# Patient Record
Sex: Female | Born: 1970 | Hispanic: No | Marital: Married | Smoking: Former smoker
Health system: Southern US, Community
[De-identification: ages and names within clinical notes are randomized; demographics above are authoritative.]

## PROBLEM LIST (undated history)

## (undated) DIAGNOSIS — K224 Dyskinesia of esophagus: Secondary | ICD-10-CM

## (undated) DIAGNOSIS — J449 Chronic obstructive pulmonary disease, unspecified: Secondary | ICD-10-CM

## (undated) DIAGNOSIS — J45909 Unspecified asthma, uncomplicated: Secondary | ICD-10-CM

## (undated) DIAGNOSIS — K219 Gastro-esophageal reflux disease without esophagitis: Secondary | ICD-10-CM

## (undated) DIAGNOSIS — E785 Hyperlipidemia, unspecified: Secondary | ICD-10-CM

## (undated) DIAGNOSIS — G43909 Migraine, unspecified, not intractable, without status migrainosus: Secondary | ICD-10-CM

## (undated) DIAGNOSIS — S43006A Unspecified dislocation of unspecified shoulder joint, initial encounter: Secondary | ICD-10-CM

## (undated) DIAGNOSIS — E7800 Pure hypercholesterolemia, unspecified: Secondary | ICD-10-CM

## (undated) DIAGNOSIS — G510 Bell's palsy: Secondary | ICD-10-CM

## (undated) DIAGNOSIS — F429 Obsessive-compulsive disorder, unspecified: Secondary | ICD-10-CM

## (undated) DIAGNOSIS — C801 Malignant (primary) neoplasm, unspecified: Secondary | ICD-10-CM

## (undated) DIAGNOSIS — G4730 Sleep apnea, unspecified: Secondary | ICD-10-CM

## (undated) DIAGNOSIS — R51 Headache: Secondary | ICD-10-CM

## (undated) DIAGNOSIS — M797 Fibromyalgia: Secondary | ICD-10-CM

## (undated) DIAGNOSIS — F32A Depression, unspecified: Secondary | ICD-10-CM

## (undated) DIAGNOSIS — F419 Anxiety disorder, unspecified: Secondary | ICD-10-CM

## (undated) DIAGNOSIS — F319 Bipolar disorder, unspecified: Secondary | ICD-10-CM

## (undated) DIAGNOSIS — R5383 Other fatigue: Secondary | ICD-10-CM

## (undated) DIAGNOSIS — E119 Type 2 diabetes mellitus without complications: Secondary | ICD-10-CM

## (undated) DIAGNOSIS — E079 Disorder of thyroid, unspecified: Secondary | ICD-10-CM

## (undated) DIAGNOSIS — G4733 Obstructive sleep apnea (adult) (pediatric): Secondary | ICD-10-CM

## (undated) DIAGNOSIS — J9819 Other pulmonary collapse: Secondary | ICD-10-CM

## (undated) DIAGNOSIS — F329 Major depressive disorder, single episode, unspecified: Secondary | ICD-10-CM

## (undated) HISTORY — DX: Bell's palsy: G51.0

## (undated) HISTORY — DX: Pure hypercholesterolemia, unspecified: E78.00

## (undated) HISTORY — DX: Obsessive-compulsive disorder, unspecified: F42.9

## (undated) HISTORY — DX: Obstructive sleep apnea (adult) (pediatric): G47.33

## (undated) HISTORY — DX: Disorder of thyroid, unspecified: E07.9

## (undated) HISTORY — DX: Gastro-esophageal reflux disease without esophagitis: K21.9

## (undated) HISTORY — PX: DILATION AND CURETTAGE OF UTERUS: SHX78

## (undated) HISTORY — DX: Hyperlipidemia, unspecified: E78.5

## (undated) HISTORY — DX: Major depressive disorder, single episode, unspecified: F32.9

## (undated) HISTORY — DX: Type 2 diabetes mellitus without complications: E11.9

## (undated) HISTORY — DX: Malignant (primary) neoplasm, unspecified: C80.1

## (undated) HISTORY — DX: Other pulmonary collapse: J98.19

## (undated) HISTORY — DX: Migraine, unspecified, not intractable, without status migrainosus: G43.909

## (undated) HISTORY — PX: GALLBLADDER SURGERY: SHX652

## (undated) HISTORY — DX: Fibromyalgia: M79.7

## (undated) HISTORY — DX: Anxiety disorder, unspecified: F41.9

## (undated) HISTORY — PX: ABDOMINAL HYSTERECTOMY: SHX81

## (undated) HISTORY — DX: Dyskinesia of esophagus: K22.4

## (undated) HISTORY — DX: Sleep apnea, unspecified: G47.30

## (undated) HISTORY — DX: Headache: R51

## (undated) HISTORY — DX: Depression, unspecified: F32.A

## (undated) HISTORY — DX: Chronic obstructive pulmonary disease, unspecified: J44.9

## (undated) HISTORY — PX: OTHER SURGICAL HISTORY: SHX169

## (undated) HISTORY — DX: Unspecified dislocation of unspecified shoulder joint, initial encounter: S43.006A

## (undated) HISTORY — DX: Unspecified asthma, uncomplicated: J45.909

## (undated) HISTORY — DX: Bipolar disorder, unspecified: F31.9

## (undated) HISTORY — DX: Other fatigue: R53.83

---

## 1996-09-17 HISTORY — PX: SHOULDER SURGERY: SHX246

## 1997-03-07 DIAGNOSIS — G9332 Myalgic encephalomyelitis/chronic fatigue syndrome: Secondary | ICD-10-CM | POA: Insufficient documentation

## 1997-10-18 DIAGNOSIS — F4010 Social phobia, unspecified: Secondary | ICD-10-CM | POA: Insufficient documentation

## 1998-07-09 ENCOUNTER — Emergency Department (HOSPITAL_COMMUNITY): Admission: EM | Admit: 1998-07-09 | Discharge: 1998-07-09 | Payer: Self-pay | Admitting: Emergency Medicine

## 1999-11-02 ENCOUNTER — Emergency Department (HOSPITAL_COMMUNITY): Admission: EM | Admit: 1999-11-02 | Discharge: 1999-11-02 | Payer: Self-pay | Admitting: *Deleted

## 1999-12-22 ENCOUNTER — Encounter: Payer: Self-pay | Admitting: Emergency Medicine

## 1999-12-22 ENCOUNTER — Emergency Department (HOSPITAL_COMMUNITY): Admission: EM | Admit: 1999-12-22 | Discharge: 1999-12-22 | Payer: Self-pay | Admitting: Emergency Medicine

## 2000-01-25 ENCOUNTER — Emergency Department (HOSPITAL_COMMUNITY): Admission: EM | Admit: 2000-01-25 | Discharge: 2000-01-25 | Payer: Self-pay | Admitting: Emergency Medicine

## 2000-02-20 ENCOUNTER — Emergency Department (HOSPITAL_COMMUNITY): Admission: EM | Admit: 2000-02-20 | Discharge: 2000-02-20 | Payer: Self-pay | Admitting: Emergency Medicine

## 2000-04-11 ENCOUNTER — Emergency Department (HOSPITAL_COMMUNITY): Admission: EM | Admit: 2000-04-11 | Discharge: 2000-04-11 | Payer: Self-pay | Admitting: Emergency Medicine

## 2000-05-25 ENCOUNTER — Emergency Department (HOSPITAL_COMMUNITY): Admission: EM | Admit: 2000-05-25 | Discharge: 2000-05-25 | Payer: Self-pay | Admitting: Emergency Medicine

## 2000-08-15 ENCOUNTER — Inpatient Hospital Stay (HOSPITAL_COMMUNITY): Admission: EM | Admit: 2000-08-15 | Discharge: 2000-08-15 | Payer: Self-pay | Admitting: Emergency Medicine

## 2000-08-15 ENCOUNTER — Encounter: Payer: Self-pay | Admitting: Emergency Medicine

## 2000-08-21 ENCOUNTER — Emergency Department (HOSPITAL_COMMUNITY): Admission: EM | Admit: 2000-08-21 | Discharge: 2000-08-21 | Payer: Self-pay | Admitting: Emergency Medicine

## 2000-10-24 ENCOUNTER — Emergency Department (HOSPITAL_COMMUNITY): Admission: EM | Admit: 2000-10-24 | Discharge: 2000-10-24 | Payer: Self-pay | Admitting: Emergency Medicine

## 2000-10-31 ENCOUNTER — Ambulatory Visit (HOSPITAL_COMMUNITY): Admission: RE | Admit: 2000-10-31 | Discharge: 2000-10-31 | Payer: Self-pay | Admitting: Internal Medicine

## 2000-10-31 ENCOUNTER — Encounter: Payer: Self-pay | Admitting: Internal Medicine

## 2000-11-01 ENCOUNTER — Emergency Department (HOSPITAL_COMMUNITY): Admission: EM | Admit: 2000-11-01 | Discharge: 2000-11-01 | Payer: Self-pay | Admitting: Emergency Medicine

## 2000-12-23 ENCOUNTER — Emergency Department (HOSPITAL_COMMUNITY): Admission: EM | Admit: 2000-12-23 | Discharge: 2000-12-23 | Payer: Self-pay | Admitting: Emergency Medicine

## 2001-01-12 ENCOUNTER — Emergency Department (HOSPITAL_COMMUNITY): Admission: EM | Admit: 2001-01-12 | Discharge: 2001-01-12 | Payer: Self-pay | Admitting: Emergency Medicine

## 2001-01-16 ENCOUNTER — Encounter (HOSPITAL_COMMUNITY): Admission: RE | Admit: 2001-01-16 | Discharge: 2001-04-16 | Payer: Self-pay | Admitting: *Deleted

## 2001-01-23 ENCOUNTER — Emergency Department (HOSPITAL_COMMUNITY): Admission: EM | Admit: 2001-01-23 | Discharge: 2001-01-24 | Payer: Self-pay | Admitting: Emergency Medicine

## 2001-02-02 ENCOUNTER — Emergency Department (HOSPITAL_COMMUNITY): Admission: EM | Admit: 2001-02-02 | Discharge: 2001-02-02 | Payer: Self-pay | Admitting: Emergency Medicine

## 2001-02-26 ENCOUNTER — Emergency Department (HOSPITAL_COMMUNITY): Admission: EM | Admit: 2001-02-26 | Discharge: 2001-02-26 | Payer: Self-pay | Admitting: Emergency Medicine

## 2001-05-02 ENCOUNTER — Emergency Department (HOSPITAL_COMMUNITY): Admission: EM | Admit: 2001-05-02 | Discharge: 2001-05-02 | Payer: Self-pay | Admitting: *Deleted

## 2001-05-16 ENCOUNTER — Emergency Department (HOSPITAL_COMMUNITY): Admission: EM | Admit: 2001-05-16 | Discharge: 2001-05-16 | Payer: Self-pay | Admitting: Emergency Medicine

## 2001-06-04 ENCOUNTER — Emergency Department (HOSPITAL_COMMUNITY): Admission: EM | Admit: 2001-06-04 | Discharge: 2001-06-04 | Payer: Self-pay | Admitting: Emergency Medicine

## 2001-07-29 ENCOUNTER — Emergency Department (HOSPITAL_COMMUNITY): Admission: EM | Admit: 2001-07-29 | Discharge: 2001-07-29 | Payer: Self-pay | Admitting: Emergency Medicine

## 2001-09-03 ENCOUNTER — Emergency Department (HOSPITAL_COMMUNITY): Admission: EM | Admit: 2001-09-03 | Discharge: 2001-09-03 | Payer: Self-pay | Admitting: Emergency Medicine

## 2001-09-03 ENCOUNTER — Emergency Department (HOSPITAL_COMMUNITY): Admission: EM | Admit: 2001-09-03 | Discharge: 2001-09-03 | Payer: Self-pay

## 2001-09-14 ENCOUNTER — Emergency Department (HOSPITAL_COMMUNITY): Admission: EM | Admit: 2001-09-14 | Discharge: 2001-09-14 | Payer: Self-pay | Admitting: Emergency Medicine

## 2001-09-14 ENCOUNTER — Encounter: Payer: Self-pay | Admitting: *Deleted

## 2001-10-24 ENCOUNTER — Emergency Department (HOSPITAL_COMMUNITY): Admission: EM | Admit: 2001-10-24 | Discharge: 2001-10-24 | Payer: Self-pay | Admitting: Emergency Medicine

## 2001-12-22 ENCOUNTER — Emergency Department (HOSPITAL_COMMUNITY): Admission: EM | Admit: 2001-12-22 | Discharge: 2001-12-22 | Payer: Self-pay | Admitting: *Deleted

## 2002-01-01 ENCOUNTER — Inpatient Hospital Stay (HOSPITAL_COMMUNITY): Admission: EM | Admit: 2002-01-01 | Discharge: 2002-01-14 | Payer: Self-pay | Admitting: Psychiatry

## 2002-05-03 ENCOUNTER — Emergency Department (HOSPITAL_COMMUNITY): Admission: EM | Admit: 2002-05-03 | Discharge: 2002-05-03 | Payer: Self-pay | Admitting: Emergency Medicine

## 2006-06-22 ENCOUNTER — Inpatient Hospital Stay (HOSPITAL_COMMUNITY): Admission: EM | Admit: 2006-06-22 | Discharge: 2006-07-01 | Payer: Self-pay | Admitting: Psychiatry

## 2006-06-22 ENCOUNTER — Ambulatory Visit: Payer: Self-pay | Admitting: Psychiatry

## 2006-07-02 ENCOUNTER — Other Ambulatory Visit (HOSPITAL_COMMUNITY): Admission: RE | Admit: 2006-07-02 | Discharge: 2006-09-30 | Payer: Self-pay | Admitting: Psychiatry

## 2007-11-04 ENCOUNTER — Ambulatory Visit: Payer: Self-pay | Admitting: Psychiatry

## 2007-11-04 ENCOUNTER — Inpatient Hospital Stay (HOSPITAL_COMMUNITY): Admission: AD | Admit: 2007-11-04 | Discharge: 2007-11-10 | Payer: Self-pay | Admitting: Psychiatry

## 2007-12-19 ENCOUNTER — Inpatient Hospital Stay (HOSPITAL_COMMUNITY): Admission: AD | Admit: 2007-12-19 | Discharge: 2007-12-20 | Payer: Self-pay | Admitting: Obstetrics and Gynecology

## 2008-05-09 ENCOUNTER — Emergency Department (HOSPITAL_COMMUNITY): Admission: EM | Admit: 2008-05-09 | Discharge: 2008-05-09 | Payer: Self-pay | Admitting: Emergency Medicine

## 2008-05-25 ENCOUNTER — Ambulatory Visit (HOSPITAL_COMMUNITY): Admission: RE | Admit: 2008-05-25 | Discharge: 2008-05-26 | Payer: Self-pay | Admitting: Obstetrics & Gynecology

## 2008-05-25 ENCOUNTER — Encounter (INDEPENDENT_AMBULATORY_CARE_PROVIDER_SITE_OTHER): Payer: Self-pay | Admitting: Obstetrics & Gynecology

## 2008-12-05 ENCOUNTER — Emergency Department (HOSPITAL_COMMUNITY): Admission: EM | Admit: 2008-12-05 | Discharge: 2008-12-06 | Payer: Self-pay | Admitting: Emergency Medicine

## 2008-12-13 ENCOUNTER — Encounter: Admission: RE | Admit: 2008-12-13 | Discharge: 2009-03-13 | Payer: Self-pay | Admitting: Psychiatry

## 2009-03-30 ENCOUNTER — Encounter: Admission: RE | Admit: 2009-03-30 | Discharge: 2009-06-28 | Payer: Self-pay | Admitting: Psychiatry

## 2009-07-25 ENCOUNTER — Encounter: Admission: RE | Admit: 2009-07-25 | Discharge: 2009-07-25 | Payer: Self-pay | Admitting: Obstetrics & Gynecology

## 2009-11-12 ENCOUNTER — Emergency Department (HOSPITAL_COMMUNITY): Admission: EM | Admit: 2009-11-12 | Discharge: 2009-11-12 | Payer: Self-pay | Admitting: Emergency Medicine

## 2009-11-23 ENCOUNTER — Encounter: Admission: RE | Admit: 2009-11-23 | Discharge: 2010-01-17 | Payer: Self-pay | Admitting: Family Medicine

## 2010-02-04 ENCOUNTER — Emergency Department (HOSPITAL_COMMUNITY): Admission: EM | Admit: 2010-02-04 | Discharge: 2010-02-04 | Payer: Self-pay | Admitting: Emergency Medicine

## 2010-10-08 ENCOUNTER — Encounter: Payer: Self-pay | Admitting: Obstetrics & Gynecology

## 2010-10-10 ENCOUNTER — Ambulatory Visit (HOSPITAL_COMMUNITY)
Admission: RE | Admit: 2010-10-10 | Discharge: 2010-10-10 | Payer: Self-pay | Source: Home / Self Care | Attending: Psychiatry | Admitting: Psychiatry

## 2010-10-11 ENCOUNTER — Other Ambulatory Visit (HOSPITAL_COMMUNITY)
Admission: RE | Admit: 2010-10-11 | Discharge: 2010-10-17 | Payer: Self-pay | Source: Home / Self Care | Attending: Psychiatry | Admitting: Psychiatry

## 2010-10-19 ENCOUNTER — Inpatient Hospital Stay (HOSPITAL_COMMUNITY)
Admission: AD | Admit: 2010-10-19 | Discharge: 2010-10-23 | DRG: 881 | Disposition: A | Discharge status: Home / Self Care | Payer: Medicare Other | Source: Ambulatory Visit | Attending: Psychiatry | Admitting: Psychiatry

## 2010-10-19 ENCOUNTER — Inpatient Hospital Stay (HOSPITAL_COMMUNITY): Admission: AD | Admit: 2010-10-19 | Payer: Self-pay | Source: Ambulatory Visit

## 2010-10-19 DIAGNOSIS — R45851 Suicidal ideations: Secondary | ICD-10-CM

## 2010-10-19 DIAGNOSIS — E039 Hypothyroidism, unspecified: Secondary | ICD-10-CM

## 2010-10-19 DIAGNOSIS — F329 Major depressive disorder, single episode, unspecified: Principal | ICD-10-CM

## 2010-10-19 DIAGNOSIS — L408 Other psoriasis: Secondary | ICD-10-CM

## 2010-10-19 DIAGNOSIS — G43909 Migraine, unspecified, not intractable, without status migrainosus: Secondary | ICD-10-CM

## 2010-10-19 DIAGNOSIS — Z6379 Other stressful life events affecting family and household: Secondary | ICD-10-CM

## 2010-10-19 DIAGNOSIS — F3289 Other specified depressive episodes: Principal | ICD-10-CM

## 2010-10-19 DIAGNOSIS — IMO0002 Reserved for concepts with insufficient information to code with codable children: Secondary | ICD-10-CM

## 2010-10-19 LAB — CBC
HCT: 37.7 % (ref 36.0–46.0)
Hemoglobin: 13.2 g/dL (ref 12.0–15.0)
MCH: 29.8 pg (ref 26.0–34.0)
MCHC: 35 g/dL (ref 30.0–36.0)
MCV: 85.1 fL (ref 78.0–100.0)
Platelets: 219 K/uL (ref 150–400)
RBC: 4.43 MIL/uL (ref 3.87–5.11)
RDW: 12.4 % (ref 11.5–15.5)
WBC: 6.4 K/uL (ref 4.0–10.5)

## 2010-10-19 LAB — COMPREHENSIVE METABOLIC PANEL WITH GFR
ALT: 16 U/L (ref 0–35)
AST: 16 U/L (ref 0–37)
Albumin: 4.1 g/dL (ref 3.5–5.2)
Alkaline Phosphatase: 101 U/L (ref 39–117)
BUN: 11 mg/dL (ref 6–23)
CO2: 31 meq/L (ref 19–32)
Calcium: 9.8 mg/dL (ref 8.4–10.5)
Chloride: 102 meq/L (ref 96–112)
Creatinine, Ser: 0.66 mg/dL (ref 0.4–1.2)
GFR calc non Af Amer: >60 mL/min
Glucose, Bld: 89 mg/dL (ref 70–99)
Potassium: 4.4 meq/L (ref 3.5–5.1)
Sodium: 141 meq/L (ref 135–145)
Total Bilirubin: 0.4 mg/dL (ref 0.3–1.2)
Total Protein: 7 g/dL (ref 6.0–8.3)

## 2010-10-20 DIAGNOSIS — F39 Unspecified mood [affective] disorder: Secondary | ICD-10-CM

## 2010-10-20 LAB — CARBAMAZEPINE LEVEL, TOTAL: Carbamazepine Lvl: 9.7 ug/mL (ref 4.0–12.0)

## 2010-10-23 ENCOUNTER — Other Ambulatory Visit (HOSPITAL_COMMUNITY): Payer: Medicare Other | Attending: Psychiatry | Admitting: Psychiatry

## 2010-10-23 DIAGNOSIS — E669 Obesity, unspecified: Secondary | ICD-10-CM | POA: Insufficient documentation

## 2010-10-23 DIAGNOSIS — F319 Bipolar disorder, unspecified: Secondary | ICD-10-CM | POA: Insufficient documentation

## 2010-10-23 DIAGNOSIS — F909 Attention-deficit hyperactivity disorder, unspecified type: Secondary | ICD-10-CM | POA: Insufficient documentation

## 2010-10-23 DIAGNOSIS — IMO0002 Reserved for concepts with insufficient information to code with codable children: Secondary | ICD-10-CM | POA: Insufficient documentation

## 2010-10-23 DIAGNOSIS — Z818 Family history of other mental and behavioral disorders: Secondary | ICD-10-CM | POA: Insufficient documentation

## 2010-10-23 DIAGNOSIS — E039 Hypothyroidism, unspecified: Secondary | ICD-10-CM | POA: Insufficient documentation

## 2010-10-23 DIAGNOSIS — G43909 Migraine, unspecified, not intractable, without status migrainosus: Secondary | ICD-10-CM | POA: Insufficient documentation

## 2010-10-23 DIAGNOSIS — J45909 Unspecified asthma, uncomplicated: Secondary | ICD-10-CM | POA: Insufficient documentation

## 2010-10-24 ENCOUNTER — Other Ambulatory Visit (HOSPITAL_COMMUNITY): Payer: Medicare Other | Admitting: Psychiatry

## 2010-10-25 ENCOUNTER — Other Ambulatory Visit (HOSPITAL_COMMUNITY): Payer: Medicare Other | Admitting: Psychiatry

## 2010-10-26 ENCOUNTER — Other Ambulatory Visit (HOSPITAL_COMMUNITY): Payer: Medicare Other | Admitting: Psychiatry

## 2010-10-27 ENCOUNTER — Other Ambulatory Visit (HOSPITAL_COMMUNITY): Payer: Medicare Other | Admitting: Psychiatry

## 2010-10-30 ENCOUNTER — Other Ambulatory Visit (HOSPITAL_COMMUNITY): Payer: Medicare Other | Admitting: Psychiatry

## 2010-10-31 ENCOUNTER — Other Ambulatory Visit (HOSPITAL_COMMUNITY): Payer: Medicare Other | Admitting: Psychiatry

## 2010-11-01 ENCOUNTER — Other Ambulatory Visit (HOSPITAL_COMMUNITY): Payer: Medicare Other | Admitting: Psychiatry

## 2010-11-02 ENCOUNTER — Other Ambulatory Visit (HOSPITAL_COMMUNITY): Payer: Medicare Other | Admitting: Psychiatry

## 2010-11-03 ENCOUNTER — Other Ambulatory Visit (HOSPITAL_COMMUNITY): Payer: Medicare Other | Admitting: Psychiatry

## 2010-11-05 NOTE — Discharge Summary (Signed)
[name], [name]                ACCOUNT NO.:  [id number]  MEDICAL RECORD NO.:  [id number]           PATIENT TYPE:  I  LOCATION:  0306                          FACILITY:  [facility]  PHYSICIAN:  Eulogio Ditch, MD DATE OF BIRTH:  1970/09/18  DATE OF ADMISSION:  10/19/2010 DATE OF DISCHARGE:  10/23/2010                              DISCHARGE SUMMARY   IDENTIFYING INFORMATION:  This is a 41 year old Caucasian female, voluntary admission.  HISTORY OF PRESENT ILLNESS:  [name] presents on referral from our Intensive Outpatient Program after reporting that she was suicidal, with a plan to overdose on NyQuil or possibly to take pills.  She reported that she was just overwhelmed with some many stressors in her life, that she felt she could not cope.  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  Husband recently had lost his job and then they found out that her father-in-law has cancer.  She reported that she had not been sleeping regularly, had been isolating, having crying spells, agitated thoughts and suicidal thoughts for the previous 2 weeks.  Also reported significant anhedonia for almost 3 weeks.  This is a 40 year old living in [place] with her parents, her husband and her 55-year-old daughter.  No known legal charges.  MEDICAL EVALUATION:  Her physical exam was done and is noted in the record.  This is a generally healthy Caucasian female in no acute distress.  Medium built, nonfocal neuro, no abnormal movements and motor is smooth.  REVIEW OF SYSTEMS:  Remarkable for anhedonia.  CURRENT MEDICAL PROBLEMS:  Hypothyroidism and psoriasis.  DIAGNOSTIC STUDIES:  Unremarkable.  ADMITTING MENTAL STATUS EXAM:  A fully alert female complaining of depressed mood with suicidal thoughts on and off.  Denying any hallucinations.  Cognition intact.  No evidence of psychosis.  Endorsing decreased attention.  ADMITTING DIAGNOSES:  AXIS I: Depressive  disorder, not otherwise specified. AXIS II: No diagnosis. AXIS III: Psoriasis, migraine headaches, hypothyroidism. AXIS IV: Significant family stressors and relationship issues. AXIS V: Current 48 and past year not known.  HOSPITAL COURSE:  She was admitted to our Mood Disorders Program and we elected to continue her current Cymbalta 60 mg twice daily and Abilify 15 mg q.a.m. and q.h.s. She continued on her CPAP at night and her other routine medications.  This included carbamazepine 200 mg three tablets in the morning and three tablets at bedtime, Synthroid 50 mcg daily, Klonopin 1 tablet b.i.d., Tegretol had been prescribed for mood management, along with gabapentin that she was also taking for anxiety.  She was gradually assimilated into the milieu and her participation in group therapy and unit activities was appropriate.  She felt that her concentration was decreased and had had previous luck with stimulants, and was requesting a trial of Adderall to which we agreed and she was started on Adderall 20 mg p.o. b.i.d.  By February 5, she was denying any suicidal or homicidal thoughts, generally feeling better, affect broader and much more stable, and sleeping well at night.  We spoke with her husband [name], who had no safety concerns about her  returning home and reported that she had no access to weapons.  DISCHARGE MENTAL STATUS EXAM:  A fully alert female.  No suicidal or homicidal thoughts.  Affect improved and broader, mood euthymic, in full contact with reality clearly and convincingly and denying any suicidal ideas.  DISCHARGE/PLAN:  Follow up with [name] on February 8 at 3:40 p.m.  DISCHARGE DIAGNOSES:  AXIS I: Depressive disorder, not otherwise specified. AXIS II: No diagnosis. AXIS III: History of migraine headaches, psoriasis and hypothyroidism. AXIS IV:  Burden of illness issues, family and caregiver stressors. AXIS V: Current 60 and past year not  known.  DISCHARGE CONDITION:  Stable.  DISCHARGE MEDICATIONS: 1. Abilify 15 mg p.o. daily b.i.d. 2. Benztropine 2 mg daily at 4:00 p.m. 3. Carbamazepine 200 mg three tablets in the morning, one tablet at     noon and three tablets at 1:00 p.m. 4. Gabapentin 600 mg 1/2 tablet t.i.d. and 1 tablet at bedtime (She     was instructed to discontinue gabapentin that she was previously     taking). 5. Adderall 20 mg one capsule in the morning and 1 at 2:00 p.m. 6. Cymbalta 60 mg b.i.d. 7. Klonopin 1 mg b.i.d. p.r.n. anxiety. 8. Multivitamin 1 daily. 9. Omeprazole 40 mg daily. 10.Ranitidine 150 mg daily at bedtime. 11.Synthroid 50 mcg q.a.m.     [name], N.P.   ______________________________ Eulogio Ditch, MD    MAS/MEDQ  D:  10/27/2010  T:  10/27/2010  Job:  [id number]  Electronically Signed by Kari Baars N.P. on 10/30/2010 02:28:05 PM Electronically Signed by Eulogio Ditch  on 11/05/2010 06:06:39 AM

## 2010-11-06 ENCOUNTER — Other Ambulatory Visit (HOSPITAL_COMMUNITY): Payer: Medicare Other | Admitting: Psychiatry

## 2010-11-07 ENCOUNTER — Other Ambulatory Visit (HOSPITAL_COMMUNITY): Payer: Medicare Other | Admitting: Psychiatry

## 2010-11-08 ENCOUNTER — Other Ambulatory Visit (HOSPITAL_COMMUNITY): Payer: Medicare Other | Admitting: Psychiatry

## 2010-11-09 ENCOUNTER — Other Ambulatory Visit: Payer: Self-pay | Admitting: Orthopedic Surgery

## 2010-11-09 ENCOUNTER — Other Ambulatory Visit (HOSPITAL_COMMUNITY): Payer: Medicare Other | Admitting: Psychiatry

## 2010-11-09 DIAGNOSIS — M25512 Pain in left shoulder: Secondary | ICD-10-CM

## 2010-11-10 ENCOUNTER — Other Ambulatory Visit (HOSPITAL_COMMUNITY): Payer: Medicare Other | Admitting: Psychiatry

## 2010-11-13 ENCOUNTER — Other Ambulatory Visit (HOSPITAL_COMMUNITY): Payer: Medicare Other | Attending: Psychiatry | Admitting: Psychiatry

## 2010-11-14 ENCOUNTER — Other Ambulatory Visit (HOSPITAL_COMMUNITY): Payer: Medicare Other | Admitting: Psychiatry

## 2010-11-15 ENCOUNTER — Other Ambulatory Visit (HOSPITAL_COMMUNITY): Payer: Medicare Other | Admitting: Psychiatry

## 2010-11-16 ENCOUNTER — Other Ambulatory Visit (HOSPITAL_COMMUNITY): Payer: Medicare Other | Admitting: Psychiatry

## 2010-11-17 ENCOUNTER — Other Ambulatory Visit (HOSPITAL_COMMUNITY): Payer: Medicare Other | Admitting: Psychiatry

## 2010-11-17 ENCOUNTER — Ambulatory Visit
Admission: RE | Admit: 2010-11-17 | Discharge: 2010-11-17 | Disposition: A | Discharge status: Home / Self Care | Payer: Medicare Other | Source: Ambulatory Visit | Attending: Orthopedic Surgery | Admitting: Orthopedic Surgery

## 2010-11-17 DIAGNOSIS — M25512 Pain in left shoulder: Secondary | ICD-10-CM

## 2010-11-20 ENCOUNTER — Other Ambulatory Visit (HOSPITAL_COMMUNITY): Payer: Medicare Other | Admitting: Psychiatry

## 2010-11-21 ENCOUNTER — Other Ambulatory Visit (HOSPITAL_COMMUNITY): Payer: Medicare Other | Admitting: Psychiatry

## 2010-11-22 ENCOUNTER — Other Ambulatory Visit (HOSPITAL_COMMUNITY): Payer: Medicare Other | Admitting: Psychiatry

## 2010-11-23 ENCOUNTER — Other Ambulatory Visit (HOSPITAL_COMMUNITY): Payer: Medicare Other | Admitting: Psychiatry

## 2010-11-24 ENCOUNTER — Other Ambulatory Visit (HOSPITAL_COMMUNITY): Payer: Medicare Other | Admitting: Psychiatry

## 2010-11-27 ENCOUNTER — Other Ambulatory Visit (HOSPITAL_COMMUNITY): Payer: Medicare Other | Admitting: Psychiatry

## 2010-11-28 ENCOUNTER — Other Ambulatory Visit (HOSPITAL_COMMUNITY): Payer: Medicare Other | Admitting: Psychiatry

## 2010-11-29 ENCOUNTER — Other Ambulatory Visit (HOSPITAL_COMMUNITY): Payer: Medicare Other | Admitting: Psychiatry

## 2010-11-30 ENCOUNTER — Other Ambulatory Visit (HOSPITAL_COMMUNITY): Payer: Medicare Other | Admitting: Psychiatry

## 2010-12-01 ENCOUNTER — Other Ambulatory Visit (HOSPITAL_COMMUNITY): Payer: Medicare Other | Admitting: Psychiatry

## 2010-12-04 ENCOUNTER — Other Ambulatory Visit (HOSPITAL_COMMUNITY): Payer: Medicare Other | Admitting: Psychiatry

## 2010-12-05 ENCOUNTER — Other Ambulatory Visit (HOSPITAL_COMMUNITY): Payer: Medicare Other | Admitting: Psychiatry

## 2010-12-06 ENCOUNTER — Other Ambulatory Visit (HOSPITAL_COMMUNITY): Payer: Medicare Other | Admitting: Psychiatry

## 2010-12-07 ENCOUNTER — Other Ambulatory Visit (HOSPITAL_COMMUNITY): Payer: Medicare Other | Admitting: Psychiatry

## 2010-12-08 ENCOUNTER — Other Ambulatory Visit (HOSPITAL_COMMUNITY): Payer: Medicare Other | Admitting: Psychiatry

## 2010-12-11 ENCOUNTER — Other Ambulatory Visit (HOSPITAL_COMMUNITY): Payer: Medicare Other | Admitting: Psychiatry

## 2010-12-12 ENCOUNTER — Other Ambulatory Visit (HOSPITAL_COMMUNITY): Payer: Medicare Other | Admitting: Psychiatry

## 2010-12-13 ENCOUNTER — Other Ambulatory Visit (HOSPITAL_COMMUNITY): Payer: Medicare Other | Admitting: Psychiatry

## 2010-12-14 ENCOUNTER — Other Ambulatory Visit (HOSPITAL_COMMUNITY): Payer: Medicare Other | Admitting: Psychiatry

## 2011-01-04 ENCOUNTER — Ambulatory Visit: Payer: Medicare Other | Attending: Family Medicine | Admitting: Physical Therapy

## 2011-01-04 DIAGNOSIS — M546 Pain in thoracic spine: Secondary | ICD-10-CM | POA: Insufficient documentation

## 2011-01-04 DIAGNOSIS — M545 Low back pain, unspecified: Secondary | ICD-10-CM | POA: Insufficient documentation

## 2011-01-04 DIAGNOSIS — IMO0001 Reserved for inherently not codable concepts without codable children: Secondary | ICD-10-CM | POA: Insufficient documentation

## 2011-01-04 DIAGNOSIS — M542 Cervicalgia: Secondary | ICD-10-CM | POA: Insufficient documentation

## 2011-01-04 DIAGNOSIS — M2569 Stiffness of other specified joint, not elsewhere classified: Secondary | ICD-10-CM | POA: Insufficient documentation

## 2011-01-05 ENCOUNTER — Other Ambulatory Visit: Payer: Self-pay

## 2011-01-08 ENCOUNTER — Ambulatory Visit: Payer: Medicare Other | Admitting: Physical Therapy

## 2011-01-10 ENCOUNTER — Ambulatory Visit: Payer: Medicare Other | Admitting: Physical Therapy

## 2011-01-15 ENCOUNTER — Ambulatory Visit: Payer: Medicare Other | Admitting: Physical Therapy

## 2011-01-17 ENCOUNTER — Ambulatory Visit: Payer: Medicare Other | Attending: Family Medicine | Admitting: Physical Therapy

## 2011-01-17 DIAGNOSIS — M2569 Stiffness of other specified joint, not elsewhere classified: Secondary | ICD-10-CM | POA: Insufficient documentation

## 2011-01-17 DIAGNOSIS — M545 Low back pain, unspecified: Secondary | ICD-10-CM | POA: Insufficient documentation

## 2011-01-17 DIAGNOSIS — M546 Pain in thoracic spine: Secondary | ICD-10-CM | POA: Insufficient documentation

## 2011-01-17 DIAGNOSIS — M542 Cervicalgia: Secondary | ICD-10-CM | POA: Insufficient documentation

## 2011-01-17 DIAGNOSIS — IMO0001 Reserved for inherently not codable concepts without codable children: Secondary | ICD-10-CM | POA: Insufficient documentation

## 2011-01-22 ENCOUNTER — Ambulatory Visit: Payer: Medicare Other | Admitting: Physical Therapy

## 2011-01-24 ENCOUNTER — Ambulatory Visit: Payer: Medicare Other | Admitting: Physical Therapy

## 2011-01-30 ENCOUNTER — Encounter: Payer: Medicare Other | Admitting: Physical Therapy

## 2011-01-30 NOTE — Op Note (Signed)
NAMEGENEVIENE, TESCH NO.:  [id number]   MEDICAL RECORD NO.:  [id number]          PATIENT TYPE:  OIB   LOCATION:  9316                          FACILITY:  [facility]   PHYSICIAN:  Freddy Finner, M.D.   DATE OF BIRTH:  June 30, 1971   DATE OF PROCEDURE:  05/25/2008  DATE OF DISCHARGE:                               OPERATIVE REPORT   PREOPERATIVE DIAGNOSES:  1. Chronic pelvic pain.  2. Severe dysmenorrhea.  3. Clinical and ultrasound findings consistent with probable      adenomyosis.   POSTOPERATIVE DIAGNOSES:  1. Chronic pelvic pain.  2. Severe dysmenorrhea.  3. Clinical and ultrasound findings consistent with probable      adenomyosis.   SECONDARY DIAGNOSES:  Severe psychiatric issues with heavy medications.   OPERATIVE PROCEDURE:  Laparoscopic-assisted vaginal hysterectomy.   ANESTHESIA:  General endotracheal.   ESTIMATED INTRAOPERATIVE BLOOD LOSS:  200 cc.   INTRAOPERATIVE COMPLICATIONS:  None.   SURGEON:  Freddy Finner, M.D.   ASSISTANT:  Zelphia Cairo, M.D.   The patient's present illness was recorded in the admission note.  She  was admitted on the morning of surgery.  She was given an IV antibiotic.  She was brought to the operating room, placed in PAS hose with the  serial compression tight.  She was placed in the dorsal lithotomy  position after induction of general endotracheal anesthesia.  The  abdomen, perineum, and vagina were prepped with Betadine scrub followed  by Betadine solution.  The bladder was evacuated using a Robinson  catheter and sterile technique.  A Hulka tenaculum was attached to the  cervix under direct visualization.  Sterile drapes were applied.  Two  small incisions were made, one at the umbilicus and one just above the  symphysis.  Through the operative incision, an 11-mm blade and a  disposable trocar were introduced while elevating the anterior abdominal  wall manually.  Direct inspection revealed adequate placement  with no  evidence of injury on entry.  Pneumoperitoneum was allowed to accumulate  using carbon dioxide gas.  The patient is obese.  Scanning inspection of  the upper abdomen did not reveal any apparent abnormality, although the  liver was not visualized because of the large mass of the omentum.  There are no intra-abdominal or pelvic adhesions.  The appendix was  visualized and was normal.  The fallopian tubes and ovaries were normal.  The uterus itself was somewhat irregular and boggy in appearance and  pressure with the probe.  There was no peritoneal disease, specifically  no evidence of pelvic endometriosis.  A second trocar was placed through  the small lower incision just above the symphysis, and through it a  blunt probe used during the procedure for traction and exposure.  The  EnSeal Ethicon device was then used to progressively develop a utero-  ovarian fallopian tube, a round ligament, and upper broad ligament with  each pedicle sealed and divided with the EnSeal.  Each side was treated  essentially identically, and the dissection was taken down to the level  just above the uterine  arteries.  The bladder was not significantly  advanced, even though the patient had a previous cesarean delivery.  It  was not felt warranted to dissect the bladder abdominally due to the  lack of scarring.  For that reason, attention was then turned vaginally.  The Hulka tenaculum was removed.  A posterior weighted vaginal retractor  was placed.  Deaver retractors were used laterally and anteriorly.  The  cervix was grasped with a Christella Hartigan tenaculum.  A colpotomy incision was  made while tenting the mucosa posterior to the cervix.  The cervix was  released with a scalpel.  The LigaSure device was used to seal and  divide the uterosacral pedicles on each side.  Bladder pillars were also  similarly sealed and divided on each side.  The bladder was carefully  advanced with blunt and sharp dissection off  of the cervix.  The  cardinal ligament pedicles were taken, sealed, and divided with  LigaSure.  The anterior peritoneum was entered.  The vesicle pedicles on  each side were developed in two pedicles, each one sealed and divided.  This allowed delivery of the uterus into the vagina.  One remaining  peritoneal pedicle on the left was sealed and divided with LigaSure.  Angles of the vagina were then anchored to the uterosacrals with  mattress suture of 0 Monocryl.  Uterosacrals were plicated with 0  Monocryl single interrupted suture.  The cuff was closed vertically with  figure-of-eights of 0 Monocryl.  The Foley catheter was placed.  Reinspection was then carried out with the laparoscope and using the  Nezhat irrigation system, some small bleeding sources were identified at  the plicated uterosacrals and along the left side of the peritoneal  dissection of the broad ligament.  All of these were easily controlled  with the EnSeal device using only back-up power coagulation.  After  confirmation of hemostasis and irrigating solution and under reduced  intra-abdominal pressure, all of the irrigating solution was aspirated  from the abdomen.  The gas was allowed to escape.  Instruments removed.  Incisions were anesthetized with 0.25% plain Marcaine.  The incisions  were closed with interrupted subcuticular sutures of 3-0 Dexon.  Steri-  Strips were applied to the lower incision.  The patient tolerated the  operative procedure well.  She was awakened and taken to recovery in  good condition.      Freddy Finner, M.D.  Electronically Signed     WRN/MEDQ  D:  05/25/2008  T:  05/25/2008  Job:  [id number]
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
## 2011-01-30 NOTE — H&P (Signed)
NAMEDIETRICH, KE NO.:  [id number]   MEDICAL RECORD NO.:  [id number]          PATIENT TYPE:  AMB   LOCATION:                                [facility]ACILITY:  [facility]   PHYSICIAN:  Freddy Finner, M.D.   DATE OF BIRTH:  1971/02/08   DATE OF ADMISSION:  05/25/2008  DATE OF DISCHARGE:                              HISTORY & PHYSICAL   ADMITTING DIAGNOSES:  1. Persistent menorrhagia.  2. severe dysmenorrhea.   CLINICAL FINDINGS:  Possibly consistent with adenomyosis with slight  enlargement of uterus.   The patient is a 40 year old white married female primigravida who has  significant psychiatric issues requiring extensive medications and is  disabled because of this.  She has had 1 cesarean delivery in 2007.  She  presented first to my office in February 2009 at which time she was  referred for IUD removal because her regular physician could not find a  string to remove the IUD.  This was easily accomplished in the office at  that visit.  Also noted on her exam at that time was a small nodule on  the lower uterine segment on the left consistent with or myoma or an  adenomyoma.  She further had a history of follicular cyst by ultrasound  at an outside facility, specifically [facility].  She  continued to have dysfunctional bleeding and had a sonohysterogram in  the office on March 24, 2008, at which time she was noted to have right  follicular cyst.  Previous left ovarian cyst had resolved.  She  persisted with menorrhagia.  Options and literature on various  modalities were addressed.  She specifically does not want another  intrauterine device.  The option of an endometrial ablation and tubal  sterilization for desired sterility were discussed.  Also at the  patient's request the option of laparoscopically-assisted vaginal  hysterectomy was discussed.  She has selected the latter as her  treatment of choice.  She is admitted at this time for that purpose.   REVIEW OF SYSTEMS:  Negative except for her GYN complaints.  She has no  cardiopulmonary or GI complaints.   PAST MEDICAL HISTORY:  She does have a history of dysplasia many years  ago.  (Recent Pap smear in February 2009 was normal).  She also has a  history of thyroid disease and asthma.  She takes Synthroid for thyroid  replacement.  She is currently not on any specific medications for  respiratory difficulty because of lack of symptoms.   She has a list of medications, which she takes for her psychological  problems and for narcolepsy and that includes Provigil 400 mg 2 times a  day.  She takes Klonopin, dose undetermined, Cogentin.  She takes  Pristiq, Neurontin.  She takes Biaxin and multivitamins.   She is not a cigarette smoker.  She does not use cigarettes.  She does  not use alcohol.  She uses no recreational thoughts.   She states that her psychiatric illness includes depression and  schizophrenia.  In the recent past she has had Abilify added  to her  regimen.  She is been taking methadone.  She also has Vicodin and  promethazine for pain and nausea.   Please note that her only drug allergies to TORADOL, which causes  swelling.   Previous surgical procedures include cholecystectomy in 1998.  She has  had orthopedic surgery on her left shoulder on 2 different occasions and  most recent 2001.  She had a C-section in 2007.   Family history is noncontributory.   PHYSICAL EXAMINATION:  HEENT:  Grossly within normal limits.  NECK:  The thyroid gland is not palpably enlarged to my examination.  VITAL SIGNS:  Her blood pressure in the office 110/80.  CHEST:  Clear to auscultation throughout.  HEART:  Normal sinus rhythm without murmurs, rubs, or gallops.  ABDOMEN:  Soft and nontender.  There is no appreciable organomegaly or  palpable masses.  BREASTS:  Considered to be normal.  No skin changes, no nipple  discharge, and no palpable masses.  EXTREMITIES:  Without cyanosis,  clubbing, or edema.  PELVIC:  External genitalia, vagina, and cervix were normal to  inspection.  As noted above, the uterus is anterior and upper normal  with one small 1-cm nodule on the left anterior frontal surface.  The  adnexae are free of palpable masses on most recent examination.  Rectum  is palpably normal and confirms the above findings.   ASSESSMENT:  1. Significant medical problems as noted above.  2. Persistent dysfunctional bleeding characterized by severe      dysmenorrhea.  3. Heavy menstrual flow.  4. Occasional irregular bleeding.   PLAN:  Laparoscopically-assisted vaginal hysterectomy.  Bilateral  salpingo-oophorectomy will be performed only if demanded by condition of  the ovaries.  The patient has reviewed the ACOG pamphlet regarding the  potential of this surgery including potential risks of the procedure and  benefits.  We have discussed this thoroughly and she is now admitted and  prepared to proceed with surgery.      Freddy Finner, M.D.  Electronically Signed     WRN/MEDQ  D:  05/20/2008  T:  05/21/2008  Job:  [id number]

## 2011-02-02 ENCOUNTER — Ambulatory Visit: Payer: Medicare Other | Admitting: Physical Therapy

## 2011-02-02 NOTE — Discharge Summary (Signed)
[facility]  Patient:    [name], [name] Visit Number: [id number] MRN: [id number]          Service Type: PSY Location: [facility] Attending Physician:  Benjaman Pott Dictated by:   Jeanice Lim, M.D. Admit Date:  01/15/2002 Discharge Date: 01/17/2002                             Discharge Summary  IDENTIFYING DATA:  This is a 40 year old divorced Caucasian female voluntarily admitted for depression.  Lost her job recently.  Feeling very hopeless. Having fleeting thoughts of suicidal ideation prior to admission.  MEDICATIONS:  Off Geodon for two weeks.  On Lexapro in the past but developed a tremor and Topamax 50 mg q.h.s. for headaches and Restoril for sleep.  ALLERGIES:  TORADOL.  PHYSICAL EXAMINATION:  Essentially within normal limits.  Neurologically nonfocal.  LABORATORY DATA:  Routine admission labs essentially within normal limits.  MENTAL STATUS EXAMINATION:  The patient lying in bed awake, reporting that she felt like she could not get out of bed.  Maintaining fair eye contact.  Speech within normal limits with little _______.  Mood depressed.  Affect flat. Thought process is goal directed.  Thought content positive for auditory hallucinations, fleeting suicidal ideation.  Cognitively intact.  Judgment and insight limited.  ADMISSION DIAGNOSES: Axis I:    Major depressive disorder, severe, recurrent with psychotic            features. Axis II:   None. Axis III:  1. Herpes.            2. Migraine headaches.            3. Gastroesophageal reflux disease.            4. Bells palsy. Axis IV:   Moderate (problems with occupation and support system). Axis V:    30/55.  HOSPITAL COURSE:  The patient was admitted and ordered routine p.r.n. medications including Restoril for sleep and restarted on Seroquel, Topamax, Protonix, Klonopin and Darvocet p.r.n.  Klonopin was titrated for acute anxiety and patient was started on Prozac,  which was optimized along with Seroquel for psychotic symptoms.  The patient tolerated adjustments in medications.  Required a Demerol/Vistaril injection for severe migraine with a positive response.  The patient may have been having some side effects to Prozac and it was discontinued and she was started on Effexor, which was titrated targeting depressive symptoms.  The patient tolerated medication changes eventually, reported positive response with no side effects to clinical intervention.  CONDITION ON DISCHARGE:  Mood was more euthymic.  Affect brighter.  Thought process goal directed.  Thought content negative for dangerous ideation or psychotic symptoms.  The patient reported to have hope and motivation to be compliant with the follow-up plan.  DISCHARGE MEDICATIONS: 1. Seroquel 200 mg, 2-1/2 q.h.s. 2. Seroquel 25 mg q.a.m. and 3 p.m. 3. Restoril 15 mg q.h.s. p.r.n. 4. Protonix 40 mg q.a.m. 5. Klonopin 0.5 mg t.i.d. and 2 q.h.s. 6. Topamax 100 mg q.h.s. 7. Prozac 20 mg q.a.m. 8. Effexor XR 75 mg q.a.m. and 3 p.m.  FOLLOW-UP:  [facility] Intensive Outpatient on Jan 15, 2002 at 9 a.m.  DISCHARGE DIAGNOSES: Axis I:    Major depressive disorder, severe, recurrent with psychotic            features. Axis II:   None. Axis III:  1. Herpes.  2. Migraine headaches.            3. Gastroesophageal reflux disease.            4. Bells palsy. Axis IV:   Moderate (problems with occupation and support system). Axis V:    Global Assessment of Functioning on discharge 50-55. Dictated by:   Jeanice Lim, M.D. Attending Physician:  Carolanne Grumbling D DD:  02/11/02 TD:  02/12/02 Job: [id number]
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
## 2011-02-02 NOTE — Discharge Summary (Signed)
[name], [name] NO.:  [id number]   MEDICAL RECORD NO.:  [id number]          PATIENT TYPE:  OIB   LOCATION:  9316                          FACILITY:  [facility]   PHYSICIAN:  Freddy Finner, M.D.   DATE OF BIRTH:  12/30/1970   DATE OF ADMISSION:  05/25/2008  DATE OF DISCHARGE:  05/26/2008                               DISCHARGE SUMMARY   DISCHARGE DIAGNOSIS:  Uterine adenomyosis.   CLINICAL SYMPTOMS:  Persistent dysfunctional uterine bleeding, severe  dysmenorrhea, and menorrhagia.   OPERATIVE PROCEDURE:  Laparoscopic-assisted vaginal hysterectomy.   COMPLICATIONS:  Intraoperative and postoperative complications are none.   DISPOSITION:  The patient was in satisfactory condition.   At time of discharge, she started progressively increasing physical  activity, but no heavy lifting or vaginal entry.  She is to take a  regular diet.  She was given Percocet 5/325 to be taken as needed for  postoperative time.  She is to return to the office in 2 weeks for  postoperative followup.  She is to report heavy vaginal bleeding or  fever.   Details of the present illness, past history, family history, review of  systems, and physical exam are recorded in the admission note.  The  patient was admitted on the morning of surgery.   LABORATORY DATA:  During this admission includes a normal CBC on  admission with hemoglobin of 12.1, prothrombin time of 52 were normal on  admission.  Postoperative hemoglobin was 11.6.   HOSPITAL COURSE:  The patient was admitted on the morning of the  surgery.  She was treated perioperatively with IV antibiotic with PAS  hose for lower extremities.  She was taken to the operating room where  the above-described procedure was accomplished without intraoperative  complications or excessive blood loss.  Her postoperative course was  entirely satisfactory.  She remained afebrile throughout her hospital  stay.  By the morning of the first  postoperative day she was ambulating  without difficulty, tolerating a regular diet.  She was having adequate  bowel and bladder function.  She was discharged to home with further  disposition as noted above.      Freddy Finner, M.D.  Electronically Signed     WRN/MEDQ  D:  06/30/2008  T:  07/01/2008  Job:  [id number]

## 2011-02-05 ENCOUNTER — Ambulatory Visit: Payer: Medicare Other | Admitting: Physical Therapy

## 2011-02-07 ENCOUNTER — Ambulatory Visit: Payer: Medicare Other | Admitting: Physical Therapy

## 2011-02-14 ENCOUNTER — Ambulatory Visit: Payer: Medicare Other | Admitting: Physical Therapy

## 2011-02-20 ENCOUNTER — Ambulatory Visit: Payer: Medicare Other | Attending: Family Medicine | Admitting: Physical Therapy

## 2011-02-20 DIAGNOSIS — M545 Low back pain, unspecified: Secondary | ICD-10-CM | POA: Insufficient documentation

## 2011-02-20 DIAGNOSIS — IMO0001 Reserved for inherently not codable concepts without codable children: Secondary | ICD-10-CM | POA: Insufficient documentation

## 2011-02-20 DIAGNOSIS — M2569 Stiffness of other specified joint, not elsewhere classified: Secondary | ICD-10-CM | POA: Insufficient documentation

## 2011-02-20 DIAGNOSIS — M542 Cervicalgia: Secondary | ICD-10-CM | POA: Insufficient documentation

## 2011-02-20 DIAGNOSIS — M546 Pain in thoracic spine: Secondary | ICD-10-CM | POA: Insufficient documentation

## 2011-02-22 ENCOUNTER — Ambulatory Visit: Payer: Medicare Other | Admitting: Physical Therapy

## 2011-02-27 ENCOUNTER — Ambulatory Visit: Payer: Medicare Other | Admitting: Physical Therapy

## 2011-03-01 ENCOUNTER — Ambulatory Visit: Payer: Medicare Other | Admitting: Physical Therapy

## 2011-03-05 ENCOUNTER — Ambulatory Visit: Payer: Medicare Other | Admitting: Physical Therapy

## 2011-03-07 ENCOUNTER — Encounter: Payer: Medicare Other | Admitting: Physical Therapy

## 2011-03-09 ENCOUNTER — Encounter: Payer: Medicare Other | Admitting: Physical Therapy

## 2011-03-12 ENCOUNTER — Encounter: Payer: Medicare Other | Admitting: Physical Therapy

## 2011-03-15 ENCOUNTER — Ambulatory Visit: Payer: Medicare Other | Admitting: Physical Therapy

## 2011-03-20 ENCOUNTER — Ambulatory Visit: Payer: Medicare Other | Attending: Family Medicine | Admitting: Physical Therapy

## 2011-03-20 DIAGNOSIS — M546 Pain in thoracic spine: Secondary | ICD-10-CM | POA: Insufficient documentation

## 2011-03-20 DIAGNOSIS — M2569 Stiffness of other specified joint, not elsewhere classified: Secondary | ICD-10-CM | POA: Insufficient documentation

## 2011-03-20 DIAGNOSIS — M545 Low back pain, unspecified: Secondary | ICD-10-CM | POA: Insufficient documentation

## 2011-03-20 DIAGNOSIS — IMO0001 Reserved for inherently not codable concepts without codable children: Secondary | ICD-10-CM | POA: Insufficient documentation

## 2011-03-20 DIAGNOSIS — M542 Cervicalgia: Secondary | ICD-10-CM | POA: Insufficient documentation

## 2011-03-27 ENCOUNTER — Ambulatory Visit: Payer: Medicare Other | Admitting: Physical Therapy

## 2011-03-29 ENCOUNTER — Ambulatory Visit: Payer: Medicare Other | Admitting: Physical Therapy

## 2011-04-02 ENCOUNTER — Ambulatory Visit: Payer: Medicare Other | Admitting: Physical Therapy

## 2011-04-06 ENCOUNTER — Encounter: Payer: Medicare Other | Admitting: Physical Therapy

## 2011-04-09 ENCOUNTER — Ambulatory Visit: Payer: Medicare Other | Admitting: Physical Therapy

## 2011-04-12 ENCOUNTER — Ambulatory Visit: Payer: Medicare Other | Admitting: Physical Therapy

## 2011-04-17 ENCOUNTER — Ambulatory Visit: Payer: Medicare Other | Admitting: Rehabilitation

## 2011-04-19 ENCOUNTER — Ambulatory Visit: Payer: Medicare Other | Attending: Family Medicine | Admitting: Rehabilitation

## 2011-04-19 DIAGNOSIS — M545 Low back pain, unspecified: Secondary | ICD-10-CM | POA: Insufficient documentation

## 2011-04-19 DIAGNOSIS — M542 Cervicalgia: Secondary | ICD-10-CM | POA: Insufficient documentation

## 2011-04-19 DIAGNOSIS — M546 Pain in thoracic spine: Secondary | ICD-10-CM | POA: Insufficient documentation

## 2011-04-19 DIAGNOSIS — M2569 Stiffness of other specified joint, not elsewhere classified: Secondary | ICD-10-CM | POA: Insufficient documentation

## 2011-04-19 DIAGNOSIS — IMO0001 Reserved for inherently not codable concepts without codable children: Secondary | ICD-10-CM | POA: Insufficient documentation

## 2011-04-24 ENCOUNTER — Ambulatory Visit: Payer: Medicare Other | Admitting: Physical Therapy

## 2011-04-26 ENCOUNTER — Encounter: Payer: Medicare Other | Admitting: Physical Therapy

## 2011-06-08 DIAGNOSIS — F909 Attention-deficit hyperactivity disorder, unspecified type: Secondary | ICD-10-CM | POA: Insufficient documentation

## 2011-06-08 LAB — URINALYSIS, MICROSCOPIC ONLY
Bilirubin Urine: NEGATIVE
Glucose, UA: NEGATIVE
Hgb urine dipstick: NEGATIVE
Protein, ur: NEGATIVE
Urobilinogen, UA: 0.2

## 2011-06-08 LAB — URINE DRUGS OF ABUSE SCREEN W ALC, ROUTINE (REF LAB)
Amphetamine Screen, Ur: NEGATIVE
Cocaine Metabolites: NEGATIVE
Creatinine,U: 54.3
Ethyl Alcohol: <5
Methadone: NEGATIVE
Opiate Screen, Urine: POSITIVE — AB

## 2011-06-08 LAB — DRUGS OF ABUSE SCREEN W/O ALC, ROUTINE URINE
Barbiturate Quant, Ur: NEGATIVE
Benzodiazepines.: NEGATIVE
Cocaine Metabolites: NEGATIVE
Methadone: NEGATIVE
Opiate Screen, Urine: NEGATIVE

## 2011-06-08 LAB — COMPREHENSIVE METABOLIC PANEL
Albumin: 3.9
Alkaline Phosphatase: 108
BUN: 5 — ABNORMAL LOW
Chloride: 104
Glucose, Bld: 90
Potassium: 4.1
Total Bilirubin: 0.7

## 2011-06-08 LAB — URINALYSIS, ROUTINE W REFLEX MICROSCOPIC
Leukocytes, UA: NEGATIVE
Nitrite: NEGATIVE
Specific Gravity, Urine: 1.01
Urobilinogen, UA: 0.2

## 2011-06-08 LAB — CBC
HCT: 36.9
Hemoglobin: 12.9
Platelets: 269
WBC: 6.5

## 2011-06-08 LAB — OPIATE, QUANTITATIVE, URINE
Hydrocodone: 520 ng/mL
Oxymorphone: NEGATIVE ng/mL

## 2011-06-08 LAB — URINE MICROSCOPIC-ADD ON

## 2011-06-12 LAB — CBC
HCT: 36.9
Hemoglobin: 13.1
Platelets: 282
RDW: 12.9
WBC: 6.1

## 2011-06-12 LAB — POCT PREGNANCY, URINE
Operator id: 29278
Preg Test, Ur: NEGATIVE

## 2011-06-20 LAB — CBC
HCT: 34.2 — ABNORMAL LOW
HCT: 35.1 — ABNORMAL LOW
Hemoglobin: 12.1
MCHC: 33.8
MCV: 87.4
Platelets: 259
RBC: 4.08
RDW: 12.3
WBC: 5.7

## 2011-06-20 LAB — APTT: aPTT: 33

## 2011-06-20 LAB — PROTIME-INR: INR: 1

## 2011-08-22 ENCOUNTER — Ambulatory Visit (INDEPENDENT_AMBULATORY_CARE_PROVIDER_SITE_OTHER): Payer: Medicare Other

## 2011-08-22 DIAGNOSIS — M79609 Pain in unspecified limb: Secondary | ICD-10-CM

## 2011-08-22 DIAGNOSIS — S90129A Contusion of unspecified lesser toe(s) without damage to nail, initial encounter: Secondary | ICD-10-CM

## 2011-09-07 ENCOUNTER — Ambulatory Visit (INDEPENDENT_AMBULATORY_CARE_PROVIDER_SITE_OTHER): Payer: Medicare Other

## 2011-09-07 DIAGNOSIS — N390 Urinary tract infection, site not specified: Secondary | ICD-10-CM

## 2011-09-07 DIAGNOSIS — R300 Dysuria: Secondary | ICD-10-CM

## 2011-12-18 ENCOUNTER — Other Ambulatory Visit: Payer: Self-pay | Admitting: Physician Assistant

## 2011-12-25 ENCOUNTER — Ambulatory Visit: Payer: Medicare Other

## 2011-12-25 ENCOUNTER — Ambulatory Visit (INDEPENDENT_AMBULATORY_CARE_PROVIDER_SITE_OTHER): Payer: Medicare Other | Admitting: Internal Medicine

## 2011-12-25 VITALS — BP 118/77 | HR 98 | Temp 98.3°F | Resp 16 | Ht 63.5 in | Wt 215.0 lb

## 2011-12-25 DIAGNOSIS — S92501A Displaced unspecified fracture of right lesser toe(s), initial encounter for closed fracture: Secondary | ICD-10-CM

## 2011-12-25 DIAGNOSIS — S92919A Unspecified fracture of unspecified toe(s), initial encounter for closed fracture: Secondary | ICD-10-CM

## 2011-12-25 DIAGNOSIS — K219 Gastro-esophageal reflux disease without esophagitis: Secondary | ICD-10-CM | POA: Insufficient documentation

## 2011-12-25 DIAGNOSIS — M79672 Pain in left foot: Secondary | ICD-10-CM

## 2011-12-25 DIAGNOSIS — S9030XA Contusion of unspecified foot, initial encounter: Secondary | ICD-10-CM

## 2011-12-25 DIAGNOSIS — Z79899 Other long term (current) drug therapy: Secondary | ICD-10-CM

## 2011-12-25 DIAGNOSIS — G510 Bell's palsy: Secondary | ICD-10-CM

## 2011-12-25 DIAGNOSIS — F329 Major depressive disorder, single episode, unspecified: Secondary | ICD-10-CM

## 2011-12-25 DIAGNOSIS — M797 Fibromyalgia: Secondary | ICD-10-CM | POA: Insufficient documentation

## 2011-12-25 DIAGNOSIS — G479 Sleep disorder, unspecified: Secondary | ICD-10-CM | POA: Insufficient documentation

## 2011-12-25 DIAGNOSIS — M79609 Pain in unspecified limb: Secondary | ICD-10-CM

## 2011-12-25 DIAGNOSIS — G8929 Other chronic pain: Secondary | ICD-10-CM | POA: Insufficient documentation

## 2011-12-25 NOTE — Patient Instructions (Signed)
Contusion (Bruise) of Foot Injury to the foot causes bruises (contusions). Contusions are caused by bleeding from small blood vessels that allow blood to leak out into the muscles, cord-like structures that attach muscle to bone (tendons), and/or other soft tissue.  CAUSES  Contusions of the foot are common. Bruises are frequently seen from:  Contact sports injuries.   The use of medications that thin the blood (anti-coagulants).   Aspirin and non-steroidal anti-inflammatory agents that decrease the clotting ability.   People with vitamin deficiencies.  SYMPTOMS  Signs of foot injury include pain and swelling. At first there may be discoloration from blood under the skin. This will appear blue to purple in color. As the bruise ages, the color turns yellow. Swelling may limit the movement of the toes.  Complications from foot injury may include:  Collections of blood leading to disability if calcium deposits form. These can later limit movement in the foot.   Infection of the foot if there are breaks in the skin.   Rupture of the tendons that may need surgical repair.  DIAGNOSIS  Diagnosing foot injuries can be made by observation. If problems continue, X-rays may be needed to make sure there are no broken bones (fractures). Continuing problems may require physical therapy.  HOME CARE INSTRUCTIONS   Apply ice to the injury for 15 to 20 minutes, 3 to 4 times per day. Put the ice in a plastic bag and place a towel between the bag of ice and your skin.   An elastic wrap (like an Ace bandage) may be used to keep swelling down.   Keep foot elevated to reduce swelling and discomfort.   Try to avoid standing or walking while the foot is painful. Do not resume use until instructed by your caregiver. Then begin use gradually. If pain develops, decrease use and continue the above measures. Gradually increase activities that do not cause discomfort until you slowly have normal use.   Only take  over-the-counter or prescription medicines for pain, discomfort, or fever as directed by your caregiver. Use only if your caregiver has not given medications that would interfere.   Begin daily rehabilitation exercises when supportive wrapping is no longer needed.   Use ice massage for 10 minutes before and after workouts. Fill a large styrofoam cup with water and freeze. Tear a small amount of foam from the top so ice protrudes. Massage ice firmly over the injured area in a circle about the size of a softball.   Always eat a well balanced diet.   Follow all instructions for follow up with your caregiver, any orthopedic referrals, physical therapy and rehabilitation. Any delay in obtaining necessary care could result in delayed healing, and temporary or permanent disability.  SEEK IMMEDIATE MEDICAL CARE IF:   Your pain and swelling increase, or pain is uncontrolled with medications.   You have loss of feeling in your foot, or your foot turns cold or blue.   An oral temperature above 102 F (38.9 C) develops, not controlled by medication.   Your foot becomes warm to touch, or you have more pain with movement of your toes.   You have a foot contusion that does not improve in 1 or 2 days.   Skin is broken and signs of infection occur (drainage, increasing pain, fever, headache, muscle aches, dizziness or a general ill feeling).   You develop new, unexplained symptoms, or an increase of the symptoms that brought you to your caregiver.  MAKE SURE YOU:     Understand these instructions.   Will watch your condition.   Will get help right away if you are not doing well or get worse.  Document Released: 06/25/2006 Document Revised: 08/23/2011 Document Reviewed: 08/07/2011 Exit[employer] Patient Information 2012 [employer]. 

## 2011-12-25 NOTE — Progress Notes (Signed)
  Subjective:    Patient ID: [name], female    DOB: 12/05/1970, 41 y.o.   MRN: [id number]  HPI At home, her moms oxygen tank fell across dorsum of her foot. Very painful, a little swollen   Review of Systems Complex see problem list   Objective:   Physical Exam Left foot tenderr dorsal and slightly swollen and blue. 4th toe blue. Ankle has full rom, toes nmv intact.  [facility] reading (PRIMARY) by  Dr.Jenean Escandon fx 4th left toe not displaced        Assessment & Plan:  Fx 4th toe Contusion foot  RICE Use pain meds at home Camwalker fitted and trained Recheck if not well 2 weeks

## 2012-01-01 ENCOUNTER — Other Ambulatory Visit: Payer: Self-pay | Admitting: Physician Assistant

## 2012-01-04 ENCOUNTER — Other Ambulatory Visit: Payer: Self-pay

## 2012-01-09 ENCOUNTER — Telehealth: Payer: Self-pay

## 2012-01-09 NOTE — Telephone Encounter (Signed)
Spoke with patient and gave her the instructions to elevate leg and rtc if gets any worse.

## 2012-01-09 NOTE — Telephone Encounter (Signed)
Patient says boot was put on here for broken toe(s).  Her leg is bruising.  Wants to know how long she should wear it.   [phone]

## 2012-01-09 NOTE — Telephone Encounter (Signed)
Elevate leg.  Apply ace wrap. If persists or has decreased warmth or sensation in foot, rtc immediately.

## 2012-01-09 NOTE — Telephone Encounter (Signed)
PT STATES HER LEG STARTED SWELLING AFTER WEARING THE CAM WALKER--SHE LOOSENED IT UP BUT THE LEG IS STILL SWOLLEN AND SHE WAS WONDERING WHAT SHE SHOULD DO. PLEASE ADVISE.

## 2012-01-10 ENCOUNTER — Telehealth: Payer: Self-pay

## 2012-01-10 NOTE — Telephone Encounter (Signed)
PT WOULD LIKE TO KNOW WHAT TO DO ABOUT HER SITUATION. STATES HER LEG IS WRAPPED WITH AN ACE BANDAGE AND DIDN'T KNOW HOW LONG SHE IS TO KEEP IT ON IS IN A LOT OF PAIN. PLEASE CALL [phone]

## 2012-01-10 NOTE — Telephone Encounter (Signed)
Spoke with patient and she stated that she is still having swelling and a lot of pain.  As per Launa Flight on the day before, I advised her to come back into clinic for follow up.

## 2012-01-11 ENCOUNTER — Ambulatory Visit (INDEPENDENT_AMBULATORY_CARE_PROVIDER_SITE_OTHER): Payer: Medicare Other | Admitting: Family Medicine

## 2012-01-11 ENCOUNTER — Encounter: Payer: Self-pay | Admitting: Family Medicine

## 2012-01-11 VITALS — BP 108/72 | HR 94 | Temp 98.3°F | Resp 16 | Ht 63.0 in | Wt 215.2 lb

## 2012-01-11 DIAGNOSIS — IMO0002 Reserved for concepts with insufficient information to code with codable children: Secondary | ICD-10-CM

## 2012-01-11 DIAGNOSIS — S86899A Other injury of other muscle(s) and tendon(s) at lower leg level, unspecified leg, initial encounter: Secondary | ICD-10-CM

## 2012-01-11 NOTE — Patient Instructions (Signed)
Shin Splints Shin splints is a painful condition that is felt on the shinbone or in the muscles on either side of the bone (front of your lower leg). Shin splints happen when physical activities, such as sports or other demanding exercise, leads to inflammation of the muscles, tendons, and the thin layer that covers the shinbone.  CAUSES   Overuse of muscles.   Repetitive activities.   Flat feet or rigid arches.  Activities that could contribute to shin splints include:  A sudden increase in exercise time.   Starting a new, demanding activity.   Running up hills or long distances.   Playing sports with sudden starts and stops.   A poor warm up.   Old or worn-out shoes.  SYMPTOMS   Pain on the front of the leg.   Pain while exercising or at rest.  DIAGNOSIS  Your caregiver will diagnose shin splints from a history of your symptoms and a physical exam. You may be observed as you walk or run. X-ray exams or further testing may be needed to rule out other problems, such as a stress fracture, which also causes lower leg pain. TREATMENT  Your caregiver may decide on the treatment based on your age, history, health, and how bad the pain is. Most cases of shin splints can be managed by one or more of the following:  Resting.   Reducing the length and intensity of your exercise.   Stopping the activity that causes shin pain.   Taking medicines to control the inflammation.   Icing, massaging, stretching, and strengthening the affected area.   Getting shoes with rigid heels, shock absorption, and a good arch support.  HOME CARE INSTRUCTIONS   Resume activity steadily or as directed by your caregiver.   Restart your exercise sessions with non-weight-bearing exercises, such as cycling or swimming.   Stop running if the pain returns.   Warm up properly before exercising.   Run on a level and fairly firm surface.   Gradually change the intensity of an exercise.   Limit  increases in running distance by no more than 5 to 10% weekly. This means if you are running 5 miles, you can only increase your run by 1/2 a mile at a time.   Change your athletic shoes every 6 months, or every 350 to 450 miles.  SEEK MEDICAL CARE IF:   Symptoms continue or worsen even after treatment.   The location, intensity, or type of pain changes over time.  SEEK IMMEDIATE MEDICAL CARE IF:   You have severe pain.   You have trouble walking.  MAKE SURE YOU:  Understand these instructions.   Will watch your condition.   Will get help right away if you are not doing well or get worse.  Document Released: 08/31/2000 Document Revised: 08/23/2011 Document Reviewed: 02/18/2011 Exit[employer] Patient Information 2012 [employer]. 
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
## 2012-01-14 ENCOUNTER — Ambulatory Visit (INDEPENDENT_AMBULATORY_CARE_PROVIDER_SITE_OTHER): Payer: Medicare Other | Admitting: Family Medicine

## 2012-01-14 VITALS — BP 109/76 | HR 91 | Temp 98.1°F | Resp 18 | Ht 63.0 in | Wt 213.8 lb

## 2012-01-14 DIAGNOSIS — S86899A Other injury of other muscle(s) and tendon(s) at lower leg level, unspecified leg, initial encounter: Secondary | ICD-10-CM

## 2012-01-14 DIAGNOSIS — IMO0002 Reserved for concepts with insufficient information to code with codable children: Secondary | ICD-10-CM

## 2012-01-14 DIAGNOSIS — S92535G Nondisplaced fracture of distal phalanx of left lesser toe(s), subsequent encounter for fracture with delayed healing: Secondary | ICD-10-CM

## 2012-01-14 NOTE — Progress Notes (Signed)
41 yo with left lower tibia swelling and pain for two weeks since breaking toe on left foot.  Initially patient was wearing the cam walker, but we stopped that last week and the pain has actually worsened. Patient has tried elevation and ice O:  Left anterior lower leg discoloration circumferentially just 5 inches above the ankle, with induration and tenderness. Full ankle ROM  A:  Shin splints vs stress fx  P: referral to ortho, crutches  Return 1 week for 4 week followup on fx toes.

## 2012-01-15 ENCOUNTER — Telehealth: Payer: Self-pay

## 2012-01-15 NOTE — Telephone Encounter (Signed)
Pt is calling to let dr Milus Glazier know that she has talkled with her pain clinic and that he is able to give her pain meds if he decides and she has an appt with [facility] 01/18/12

## 2012-01-15 NOTE — Telephone Encounter (Signed)
Pt is requesting pain medication from Dr L, she states that he asked her is she needed some and she said no, but then she states she went to or was going to a pain management office and they told her she had to get her pain medication from Dr L.

## 2012-01-16 ENCOUNTER — Other Ambulatory Visit: Payer: Self-pay | Admitting: Physician Assistant

## 2012-01-18 ENCOUNTER — Other Ambulatory Visit (HOSPITAL_COMMUNITY): Payer: Self-pay | Admitting: Orthopedic Surgery

## 2012-01-18 DIAGNOSIS — M79605 Pain in left leg: Secondary | ICD-10-CM

## 2012-01-22 ENCOUNTER — Ambulatory Visit (INDEPENDENT_AMBULATORY_CARE_PROVIDER_SITE_OTHER): Payer: Medicare Other | Admitting: Family Medicine

## 2012-01-22 VITALS — BP 111/75 | HR 102 | Temp 98.0°F | Resp 20 | Ht 63.0 in | Wt 217.4 lb

## 2012-01-22 DIAGNOSIS — M84369A Stress fracture, unspecified tibia and fibula, initial encounter for fracture: Secondary | ICD-10-CM

## 2012-01-22 DIAGNOSIS — S92919A Unspecified fracture of unspecified toe(s), initial encounter for closed fracture: Secondary | ICD-10-CM

## 2012-01-22 DIAGNOSIS — M84362A Stress fracture, left tibia, initial encounter for fracture: Secondary | ICD-10-CM

## 2012-01-22 DIAGNOSIS — S92912A Unspecified fracture of left toe(s), initial encounter for closed fracture: Secondary | ICD-10-CM

## 2012-01-22 NOTE — Progress Notes (Signed)
41 yo with fx left 4th (ring finger) toe 1 month ago.  She developed left tibial pain two to three weeks ago and the [facility] orthopedic PA has dx'd her with a stress fx. Her toe has full ROM and no swelling, nontender O: patient continues on crutches.   Full ROM of toe without swelling, nontender Left lower third of tibia is mildly swollen with some ecchymosis and tenderness.  A:  Toe healed  P: follow up with ortho for the tibia from now on.

## 2012-01-22 NOTE — Patient Instructions (Signed)
Follow up with [facility] orthopedics.  The toe has healed.

## 2012-01-23 ENCOUNTER — Encounter (HOSPITAL_COMMUNITY)
Admission: RE | Admit: 2012-01-23 | Discharge: 2012-01-23 | Disposition: A | Discharge status: Home / Self Care | Payer: Medicare Other | Source: Ambulatory Visit | Attending: Orthopedic Surgery | Admitting: Orthopedic Surgery

## 2012-01-23 DIAGNOSIS — M79605 Pain in left leg: Secondary | ICD-10-CM

## 2012-01-23 DIAGNOSIS — M79609 Pain in unspecified limb: Secondary | ICD-10-CM | POA: Insufficient documentation

## 2012-01-23 MED ORDER — TECHNETIUM TC 99M MEDRONATE IV KIT
25.0000 | PACK | Freq: Once | INTRAVENOUS | Status: AC | PRN
  Administered 2012-01-23: 25 via INTRAVENOUS

## 2012-01-31 ENCOUNTER — Other Ambulatory Visit: Payer: Self-pay | Admitting: Physician Assistant

## 2012-02-06 ENCOUNTER — Other Ambulatory Visit: Payer: Self-pay | Admitting: Physician Assistant

## 2012-02-20 DIAGNOSIS — R519 Headache, unspecified: Secondary | ICD-10-CM | POA: Insufficient documentation

## 2012-02-20 DIAGNOSIS — R51 Headache: Secondary | ICD-10-CM | POA: Insufficient documentation

## 2012-04-14 ENCOUNTER — Other Ambulatory Visit: Payer: Self-pay | Admitting: Physician Assistant

## 2012-05-13 ENCOUNTER — Other Ambulatory Visit: Payer: Self-pay | Admitting: Physician Assistant

## 2012-06-17 ENCOUNTER — Encounter: Payer: Self-pay | Admitting: Family Medicine

## 2012-06-17 ENCOUNTER — Ambulatory Visit (INDEPENDENT_AMBULATORY_CARE_PROVIDER_SITE_OTHER): Payer: Medicare Other | Admitting: Family Medicine

## 2012-06-17 VITALS — BP 132/85 | HR 104 | Temp 98.0°F | Resp 16 | Ht 62.5 in | Wt 228.4 lb

## 2012-06-17 DIAGNOSIS — Z79899 Other long term (current) drug therapy: Secondary | ICD-10-CM

## 2012-06-17 DIAGNOSIS — R609 Edema, unspecified: Secondary | ICD-10-CM

## 2012-06-17 LAB — POCT CBC
MCH, POC: 29.1 pg (ref 27–31.2)
MCV: 90.7 fL (ref 80–97)
MID (cbc): 0.4 (ref 0–0.9)
POC LYMPH PERCENT: 29.5 %L (ref 10–50)
POC MID %: 6.2 %M (ref 0–12)
Platelet Count, POC: 263 10*3/uL (ref 142–424)
RDW, POC: 13.8 %
WBC: 7 10*3/uL (ref 4.6–10.2)

## 2012-06-17 LAB — COMPREHENSIVE METABOLIC PANEL
ALT: 16 U/L (ref 0–35)
Alkaline Phosphatase: 91 U/L (ref 39–117)
CO2: 27 mEq/L (ref 19–32)
Creat: 0.71 mg/dL (ref 0.50–1.10)
Glucose, Bld: 93 mg/dL (ref 70–99)
Total Bilirubin: 0.4 mg/dL (ref 0.3–1.2)

## 2012-06-17 NOTE — Progress Notes (Signed)
Subjective:    Patient ID: [name], female    DOB: July 06, 1971, 41 y.o.   MRN: [id number]  HPI  [name] pt began to notice some swelling in her feet and hands, this morning it has continued to get worse. She can't get her rings on, her watch and shoes are cutting into her.  She is otherwise asymptomatic.  She has not had any changes in her diet or activity. No alcohol or caffeine, no supplements or otcs. Other than this she is asymptomatic. She has been on all of her medications for a long time except for saphris 10 qhs which was started a wk ago. It is making her feel groggy and out of it in the morning. She thinks that this must be the cause of her swelling due to lack of any other changes.  She called her psychiatrist's and PCP's office but both were unable to see her and reassured her that this was not a common side effect of the Saphris. She called her pharmacist who informed her that 3% of people develop swelling with this med.   Review of Systems  Constitutional: Positive for fatigue. Negative for fever, chills, diaphoresis and appetite change.  Respiratory: Negative for cough, chest tightness, shortness of breath, wheezing and stridor.   Cardiovascular: Positive for leg swelling. Negative for chest pain and palpitations.  Genitourinary: Negative for dysuria, urgency and decreased urine volume.  Musculoskeletal: Positive for myalgias. Negative for joint swelling and gait problem.  Skin: Negative for color change and rash.  Neurological: Positive for facial asymmetry. Negative for dizziness, tremors, syncope, weakness, light-headedness and numbness.  Psychiatric/Behavioral: Negative for disturbed wake/sleep cycle.   Active Ambulatory Problems    Diagnosis Date Noted  . Fibromyalgia 12/25/2011  . Depression, major 12/25/2011  . Bell's palsy 12/25/2011  . Sleep disorder 12/25/2011  . Chronic pain 12/25/2011  . GERD (gastroesophageal reflux disease) 12/25/2011   Resolved Ambulatory  Problems    Diagnosis Date Noted  . No Resolved Ambulatory Problems   Past Medical History  Diagnosis Date  . Depression   . Thyroid disease   . Anxiety        Objective:   Physical Exam  Constitutional: She is oriented to person, place, and time. She appears well-developed and well-nourished.  HENT:  Head: Normocephalic and atraumatic.  Eyes: Conjunctivae normal are normal. No scleral icterus.  Cardiovascular: Normal rate, regular rhythm, normal heart sounds and intact distal pulses.   No murmur heard. Pulses:      Dorsalis pedis pulses are 2+ on the right side, and 2+ on the left side.       Posterior tibial pulses are 2+ on the right side, and 2+ on the left side.  Pulmonary/Chest: Effort normal and breath sounds normal. No respiratory distress. She has no wheezes. She has no rales.  Musculoskeletal: She exhibits edema. She exhibits no tenderness.       Trace - 1+ pitting edema on bilateral lower legs to mid shin, trace pitting edema to bilateral wrists.  Neurological: She is alert and oriented to person, place, and time.  Skin: Skin is warm and dry. No rash noted. She is not diaphoretic. No erythema. No pallor.          Assessment & Plan:  1. Edema - check cmp, cbc, tsh to r/u other underlying cause. However, due to pt's lack of other sxs and benign exam, I agree that medication is the most common cause. Discussed w/ pt that perhaps the  mobic is the cause (as ketorolac is on her allergy list for swelling) or could be gabapentin but pt adamant that these doses have been unchanged for yrs.  Pt encouraged to cont her saphris tonight while labs are pending. If she is still having edema tomorrow and lab work is normal, rec stopping the saphris and if the edema goes away, f/u w/ psych. If edema cont, restart and RTC for re-eval.

## 2012-06-17 NOTE — Progress Notes (Signed)
Reviewed and agree.

## 2012-06-18 ENCOUNTER — Telehealth: Payer: Self-pay

## 2012-06-18 LAB — TSH: TSH: 1.636 u[IU]/mL (ref 0.350–4.500)

## 2012-06-18 NOTE — Telephone Encounter (Signed)
A doctors office called and thought we were her psychiatrist. I informed we are not

## 2012-06-26 DIAGNOSIS — F419 Anxiety disorder, unspecified: Secondary | ICD-10-CM | POA: Insufficient documentation

## 2012-06-26 DIAGNOSIS — Z90710 Acquired absence of both cervix and uterus: Secondary | ICD-10-CM | POA: Insufficient documentation

## 2012-06-26 DIAGNOSIS — F209 Schizophrenia, unspecified: Secondary | ICD-10-CM | POA: Insufficient documentation

## 2012-06-26 DIAGNOSIS — G709 Myoneural disorder, unspecified: Secondary | ICD-10-CM | POA: Insufficient documentation

## 2012-07-31 ENCOUNTER — Ambulatory Visit (HOSPITAL_COMMUNITY)
Admission: RE | Admit: 2012-07-31 | Discharge: 2012-07-31 | Disposition: A | Discharge status: Home / Self Care | Payer: PRIVATE HEALTH INSURANCE | Attending: Psychiatry | Admitting: Psychiatry

## 2012-07-31 DIAGNOSIS — F329 Major depressive disorder, single episode, unspecified: Secondary | ICD-10-CM | POA: Insufficient documentation

## 2012-07-31 DIAGNOSIS — F3289 Other specified depressive episodes: Secondary | ICD-10-CM | POA: Insufficient documentation
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
## 2012-08-07 ENCOUNTER — Other Ambulatory Visit (HOSPITAL_COMMUNITY): Payer: PRIVATE HEALTH INSURANCE | Attending: Psychiatry | Admitting: Psychiatry

## 2012-08-07 ENCOUNTER — Encounter (HOSPITAL_COMMUNITY): Payer: Self-pay

## 2012-08-07 DIAGNOSIS — F329 Major depressive disorder, single episode, unspecified: Secondary | ICD-10-CM

## 2012-08-07 DIAGNOSIS — F3289 Other specified depressive episodes: Secondary | ICD-10-CM | POA: Insufficient documentation

## 2012-08-07 DIAGNOSIS — A4902 Methicillin resistant Staphylococcus aureus infection, unspecified site: Secondary | ICD-10-CM | POA: Insufficient documentation

## 2012-08-07 DIAGNOSIS — F319 Bipolar disorder, unspecified: Secondary | ICD-10-CM

## 2012-08-07 NOTE — Progress Notes (Signed)
  [facility] Intensive Outpatient Program Discharge Summary  [name] [id number]  Admission date: 08-07-12 Discharge date: 08-07-12  Reason for admission: depression      Progress in Program Toward Treatment Goals: Pt was discharged from the program as she has active MRSA.    [name]  [facility] 08/07/2012

## 2012-08-07 NOTE — Progress Notes (Signed)
Patient ID: [name], female   DOB: 1970/10/11, 41 y.o.   MRN: [id number] Discharge Note: D:  Patient met with Dr. Rutherford Limerick, who felt that MRSA was a safety concern for her and for the group. Dr. Rutherford Limerick feels that at this time patient is not able to continue with the group until the MRSA is resolved. A: Discharged patient. Follow up with Junie Bame, LPC and Jake Seats, NP. R: Pt is receptive.
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
## 2012-08-07 NOTE — Progress Notes (Signed)
    Daily Group Progress Note  Program: IOP  Group Time: 9:00-10:30 am   Participation Level: Minimal  Behavioral Response: Appropriate  Type of Therapy:  Process Group  Summary of Progress: Today was patients first day in the group. She wast introduced, was attentive and observed the group process.      Group Time: 10:30 am - 12:00 pm   Participation Level:  Minimal  Behavioral Response: Appropriate  Type of Therapy: Psycho-education Group  Summary of Progress:  Patient participated in a discussion on learning the skills of healthy boundary setting. Patient identified most with the "comliant personality" in a tendency to put others needs before their own and melt into the demands of others. Patient agreed to explore areas of stress in their life to bring to group tomorrow and explore the problem through the skills of boundary setting.   Carman Ching, LCSW

## 2012-08-08 ENCOUNTER — Other Ambulatory Visit (HOSPITAL_COMMUNITY): Payer: PRIVATE HEALTH INSURANCE

## 2012-08-08 ENCOUNTER — Emergency Department (HOSPITAL_COMMUNITY)
Admission: EM | Admit: 2012-08-08 | Discharge: 2012-08-08 | Disposition: A | Discharge status: Home / Self Care | Payer: PRIVATE HEALTH INSURANCE | Attending: Emergency Medicine | Admitting: Emergency Medicine

## 2012-08-08 ENCOUNTER — Encounter (HOSPITAL_COMMUNITY): Payer: Self-pay | Admitting: Emergency Medicine

## 2012-08-08 DIAGNOSIS — E039 Hypothyroidism, unspecified: Secondary | ICD-10-CM | POA: Insufficient documentation

## 2012-08-08 DIAGNOSIS — F329 Major depressive disorder, single episode, unspecified: Secondary | ICD-10-CM | POA: Insufficient documentation

## 2012-08-08 DIAGNOSIS — Z87891 Personal history of nicotine dependence: Secondary | ICD-10-CM | POA: Insufficient documentation

## 2012-08-08 DIAGNOSIS — Z79899 Other long term (current) drug therapy: Secondary | ICD-10-CM | POA: Insufficient documentation

## 2012-08-08 DIAGNOSIS — F3289 Other specified depressive episodes: Secondary | ICD-10-CM | POA: Insufficient documentation

## 2012-08-08 DIAGNOSIS — L02821 Furuncle of head [any part, except face]: Secondary | ICD-10-CM

## 2012-08-08 DIAGNOSIS — F411 Generalized anxiety disorder: Secondary | ICD-10-CM | POA: Insufficient documentation

## 2012-08-08 DIAGNOSIS — L02828 Furuncle of other sites: Secondary | ICD-10-CM | POA: Insufficient documentation

## 2012-08-08 DIAGNOSIS — F429 Obsessive-compulsive disorder, unspecified: Secondary | ICD-10-CM | POA: Insufficient documentation
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
## 2012-08-08 NOTE — ED Provider Notes (Signed)
Medical screening examination/treatment/procedure(s) were performed by non-physician practitioner and as supervising physician I was immediately available for consultation/collaboration.   Charles B. Sheldon, MD 08/08/12 1558 

## 2012-08-08 NOTE — ED Notes (Signed)
Pt states "I have MRSA on the back of my head for 4 months". Pt states she has been seeing a dermatologist for infection to back of head.  Pt states she has tried several antibiotics so she came to ED to see if we could rid of it.

## 2012-08-11 ENCOUNTER — Other Ambulatory Visit (HOSPITAL_COMMUNITY): Payer: PRIVATE HEALTH INSURANCE

## 2012-08-12 ENCOUNTER — Other Ambulatory Visit (HOSPITAL_COMMUNITY): Payer: PRIVATE HEALTH INSURANCE

## 2012-08-13 ENCOUNTER — Other Ambulatory Visit (HOSPITAL_COMMUNITY): Payer: PRIVATE HEALTH INSURANCE

## 2012-08-13 DIAGNOSIS — A4902 Methicillin resistant Staphylococcus aureus infection, unspecified site: Secondary | ICD-10-CM | POA: Insufficient documentation

## 2012-08-15 ENCOUNTER — Other Ambulatory Visit (HOSPITAL_COMMUNITY): Payer: PRIVATE HEALTH INSURANCE

## 2012-08-18 ENCOUNTER — Other Ambulatory Visit (HOSPITAL_COMMUNITY): Payer: Medicare Other

## 2012-08-19 ENCOUNTER — Other Ambulatory Visit (HOSPITAL_COMMUNITY): Payer: Medicare Other

## 2012-08-20 ENCOUNTER — Other Ambulatory Visit (HOSPITAL_COMMUNITY): Payer: Medicare Other

## 2012-08-21 ENCOUNTER — Other Ambulatory Visit (HOSPITAL_COMMUNITY): Payer: Medicare Other

## 2012-08-22 ENCOUNTER — Other Ambulatory Visit (HOSPITAL_COMMUNITY): Payer: Medicare Other

## 2012-08-25 ENCOUNTER — Other Ambulatory Visit (HOSPITAL_COMMUNITY): Payer: Medicare Other

## 2012-08-26 ENCOUNTER — Other Ambulatory Visit (HOSPITAL_COMMUNITY): Payer: Medicare Other

## 2012-08-27 ENCOUNTER — Other Ambulatory Visit (HOSPITAL_COMMUNITY): Payer: Medicare Other

## 2012-08-28 ENCOUNTER — Other Ambulatory Visit (HOSPITAL_COMMUNITY): Payer: Medicare Other

## 2012-11-08 ENCOUNTER — Encounter: Payer: Self-pay | Admitting: Neurology

## 2012-11-08 DIAGNOSIS — IMO0002 Reserved for concepts with insufficient information to code with codable children: Secondary | ICD-10-CM | POA: Insufficient documentation

## 2012-11-08 DIAGNOSIS — S0083XA Contusion of other part of head, initial encounter: Secondary | ICD-10-CM

## 2012-11-08 DIAGNOSIS — G4733 Obstructive sleep apnea (adult) (pediatric): Secondary | ICD-10-CM

## 2012-11-08 DIAGNOSIS — S0003XA Contusion of scalp, initial encounter: Secondary | ICD-10-CM | POA: Insufficient documentation

## 2012-11-08 DIAGNOSIS — T85695A Other mechanical complication of other nervous system device, implant or graft, initial encounter: Secondary | ICD-10-CM

## 2012-11-08 DIAGNOSIS — S1093XA Contusion of unspecified part of neck, initial encounter: Secondary | ICD-10-CM | POA: Insufficient documentation

## 2012-11-17 DIAGNOSIS — R32 Unspecified urinary incontinence: Secondary | ICD-10-CM | POA: Insufficient documentation

## 2012-11-17 DIAGNOSIS — L409 Psoriasis, unspecified: Secondary | ICD-10-CM | POA: Insufficient documentation

## 2012-12-16 ENCOUNTER — Encounter: Payer: Self-pay | Admitting: Neurology

## 2012-12-16 ENCOUNTER — Ambulatory Visit (INDEPENDENT_AMBULATORY_CARE_PROVIDER_SITE_OTHER): Payer: PRIVATE HEALTH INSURANCE | Admitting: Neurology

## 2012-12-16 VITALS — BP 100/64 | HR 78 | Temp 97.9°F | Ht 62.5 in | Wt 220.0 lb

## 2012-12-16 DIAGNOSIS — G4733 Obstructive sleep apnea (adult) (pediatric): Secondary | ICD-10-CM

## 2012-12-16 DIAGNOSIS — G510 Bell's palsy: Secondary | ICD-10-CM

## 2012-12-16 DIAGNOSIS — IMO0001 Reserved for inherently not codable concepts without codable children: Secondary | ICD-10-CM

## 2012-12-16 DIAGNOSIS — G4730 Sleep apnea, unspecified: Secondary | ICD-10-CM | POA: Insufficient documentation

## 2012-12-16 MED ORDER — METHOCARBAMOL 750 MG PO TABS: 750.0000 mg | ORAL_TABLET | Freq: Two times a day (BID) | ORAL | Status: DC

## 2012-12-16 MED ORDER — MELOXICAM 15 MG PO TABS: 15.0000 mg | ORAL_TABLET | Freq: Every day | ORAL | Status: DC

## 2012-12-16 NOTE — Progress Notes (Signed)
[name]  Provider:   Porfirio Mylar Adonna Horsley,M.D . Referring Provider: Everlene Other , urgent care -Primary Care Physician:  Clerance Lav, PA.   Chief Complaint  Patient presents with  . Sleep Apnea  . Follow-up    HPI:  [name] is a 42 y.o. female here for compliance with her  CPAP, her yearly  follow-up. CPA settting is 6 cm water .  Since she was diagnosed with sleep apnea in  07-26-11 here with a  PSG, she had  been titrated to CPAP - initially she  had not been able to use the FFM  mask.  She switched to a nasal pillow mask, but contracted a virus in December and had accordingly low compliance data. - See report from [facility]. DME in January 1013 .   She has used CPAP daily- 7 hours nightly but did not bring the machine or chip here for documentation.  She  reports now  an Epworth score of 12 , down  from the pretest score of 16 points.  She had an AHI of 19  in the diagnostic study. Very high FSS score at 47, but claimed she has chronic fatigue.   She had at the time of the PSG taken a daily nap of one to two hours, scheduled and is using CPAP during Naps. She still naps in daytime now. She is on multiple medications that will cause additional sleepiness.   She reported having severe depression and pain, and changed recently from Cymbalta to Lexapro 30 mg daily, keeps taking Abilify and Adderall, and Cogentin with the Abilify.   Review of Systems: Out of a complete 14 system review, the patient complains of only the following symptoms, and all other reviewed systems are negative.  Past Medical History  Diagnosis Date  . Depression   . Thyroid disease   . Anxiety   . Obsessive-compulsive disorder   . Fatigue   . Asthma   . Sleep apnea   . COPD (chronic obstructive pulmonary disease)   . Collapsed lung     right lung  . Migraine   . Fibromyalgia   . Bell's palsy   . ANS (arteriolar nephrosclerosis)   . Dislocated shoulder   . 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 Abdominal hysterectomy    . Joint replacement    . Cesarean section    . Ans unit     . Gallbladder removed    . Dnc    . Gallbladder surgery      1998  . Ans      2001, 2010  . Cesarean section      2007  . Dilation and curettage of uterus      2007    Current Outpatient Prescriptions  Medication Sig Dispense Refill  . amphetamine-dextroamphetamine (ADDERALL XR) 30 MG 24 hr capsule Take 30 mg by mouth every morning.      . benztropine (COGENTIN) 2 MG tablet Take 2 mg by mouth daily.      . Biotin 5000 MCG TABS Take 5,000 mcg by mouth.      . budesonide-formoterol (SYMBICORT) 80-4.5 MCG/ACT inhaler Inhale 2 puffs into the lungs 2 (two) times daily.      . Calcium Carbonate-Vitamin D (CALCIUM 600 + D PO) Take 600 mg by mouth.      . cetirizine (ZYRTEC) 10 MG tablet Take 10 mg by mouth daily.      Marland Kitchen  clonazePAM (KLONOPIN) 1 MG tablet Take 1 mg by mouth 2 (two) times daily as needed.      . docusate sodium (COLACE) 100 MG capsule Take 100 mg by mouth 2 (two) times daily.      . Escitalopram Oxalate (LEXAPRO PO) Take 20 mg by mouth daily.       . fluticasone (FLONASE) 50 MCG/ACT nasal spray Place 2 sprays into the nose daily.      Marland Kitchen gabapentin (NEURONTIN) 300 MG capsule Take 300 mg by mouth 4 (four) times daily.      . hydrochlorothiazide (MICROZIDE) 12.5 MG capsule Take 12.5 mg by mouth daily.      Marland Kitchen ipratropium (ATROVENT) 0.06 % nasal spray Place 2 sprays into the nose 3 (three) times daily.      Marland Kitchen levothyroxine (SYNTHROID, LEVOTHROID) 50 MCG tablet Take 50 mcg by mouth daily.      . methocarbamol (ROBAXIN) 750 MG tablet Take 1 tablet (750 mg total) by mouth 2 (two) times daily. NEEDS TO GET MEDICINE FROM ORTHOPEDIST FURTHER  30 tablet  0  . Multiple Vitamin (MULTIVITAMIN) tablet Take 1 tablet by mouth daily.      Marland Kitchen oxycodone (OXY-IR) 5 MG capsule Take 5 mg by mouth 3 (three) times daily.      . ranitidine (ZANTAC)  150 MG capsule Take 150 mg by mouth 2 (two) times daily.      Marland Kitchen tiZANidine (ZANAFLEX) 4 MG capsule Take 4 mg by mouth Nightly.      . vitamin B-12 (CYANOCOBALAMIN) 1000 MCG tablet Take 1,000 mcg by mouth daily.      . vitamin C (ASCORBIC ACID) 500 MG tablet Take 500 mg by mouth daily.       No current facility-administered medications for this visit.    Allergies as of 12/16/2012 - Review Complete 12/16/2012  Allergen Reaction Noted  . Ketorolac tromethamine Swelling 12/25/2011  . Tape Other (See Comments) 12/25/2011    Vitals: BP 100/64  Pulse 78  Temp(Src) 97.9 F (36.6 C)  Ht 5' 2.5" (1.588 m)  Wt 220 lb (99.791 kg)  BMI 39.57 kg/m2 Last Weight:  Wt Readings from Last 1 Encounters:  12/16/12 220 lb (99.791 kg)   Last Height:   Ht Readings from Last 1 Encounters:  12/16/12 5' 2.5" (1.588 m)   Physical Exam: General:   Patient is awake, alert & oriented to person, place & time.  Head:  Normocephalic. No  retrognathia and no TMJ- patient has partial dentures.  Ears, Nose, Throat:  Mallampati 3 Neck: circumference 15 inches  Respiratory:  Lungs clear throughout to auscultation.    Cardiovascular:  No carotid artery bruits.  Heart is regular rate and rhythm with no murmurs.  Skin:  No bruising, no rash.  Neurologic Exam: Mental Status: Alert, oriented, thought content appropriate.  Speech fluent without evidence of aphasia, m ild dysphonia. . Able to follow 3 step commands without difficulty. Cranial Nerves: II-Discs flat bilaterally. Visual fields grossly intact.patosis is not affecting EOM.  Extraocular movements intact.  Pupils reactive bilaterally.  right sided facial weakness with ptosis, all 3 branches of the facial muscles are involved.  IX/X-normal gag XI-bilateral shoulder shrug XII-midline tongue .Motor: 5/5 bilaterally with normal tone and bulk Sensory: Pinprick and light touch intact throughout, bilaterally Deep Tendon Reflexes: 2+ and symmetric  throughout Plantars: Downgoing bilaterally Cerebellar: Normal finger-to-nose, normal rapid alternating movements and normal heel-to-shin test.  Normal gait and station.

## 2012-12-16 NOTE — Assessment & Plan Note (Signed)
Since [name] had some resolution of sleepiness under the current use of CPAP at 6 cm water pressure, I would like her to continue with the use.  For the Bell's palsy residual I will prescribe meloxicam, which has given her some resolution of discomfort at night but may have a side effect of causing additional sleepiness.  I will refill meloxicam  and methocarbamol. She will follow yearly in the sleep clinic and will bring her CPAP machine to allow a data accss for CPAP  compliance.

## 2012-12-16 NOTE — Patient Instructions (Signed)
Yearly followup for CPAP compliance will be scheduled yearly  today. The patient will followup in 12 months or so the practitioner or physician assistant in this office. Meloxicam and methocarbamol has been for the first time filled today from this office upon request of her primary care physician, Dr. Everlene Other.

## 2012-12-18 ENCOUNTER — Encounter: Payer: Self-pay | Admitting: Neurology

## 2012-12-30 ENCOUNTER — Other Ambulatory Visit: Payer: Self-pay | Admitting: Neurology

## 2013-01-19 ENCOUNTER — Other Ambulatory Visit: Payer: Self-pay | Admitting: Neurology

## 2013-01-19 DIAGNOSIS — G4733 Obstructive sleep apnea (adult) (pediatric): Secondary | ICD-10-CM

## 2013-02-21 DIAGNOSIS — G4700 Insomnia, unspecified: Secondary | ICD-10-CM | POA: Insufficient documentation

## 2013-02-21 DIAGNOSIS — J4520 Mild intermittent asthma, uncomplicated: Secondary | ICD-10-CM | POA: Insufficient documentation

## 2013-03-02 ENCOUNTER — Other Ambulatory Visit: Payer: Self-pay

## 2013-03-02 MED ORDER — TIZANIDINE HCL 4 MG PO CAPS: 4.0000 mg | ORAL_CAPSULE | Freq: Every evening | ORAL | Status: DC

## 2013-03-11 DIAGNOSIS — Z85828 Personal history of other malignant neoplasm of skin: Secondary | ICD-10-CM | POA: Insufficient documentation

## 2013-06-08 ENCOUNTER — Other Ambulatory Visit: Payer: Self-pay | Admitting: Neurology

## 2013-06-17 ENCOUNTER — Encounter (HOSPITAL_COMMUNITY): Payer: Self-pay | Admitting: Emergency Medicine

## 2013-06-17 ENCOUNTER — Emergency Department (HOSPITAL_COMMUNITY)
Admission: EM | Admit: 2013-06-17 | Discharge: 2013-06-18 | Disposition: A | Discharge status: Home / Self Care | Payer: PRIVATE HEALTH INSURANCE | Attending: Emergency Medicine | Admitting: Emergency Medicine

## 2013-06-17 DIAGNOSIS — F411 Generalized anxiety disorder: Secondary | ICD-10-CM | POA: Insufficient documentation

## 2013-06-17 DIAGNOSIS — Z87828 Personal history of other (healed) physical injury and trauma: Secondary | ICD-10-CM | POA: Insufficient documentation

## 2013-06-17 DIAGNOSIS — Z791 Long term (current) use of non-steroidal anti-inflammatories (NSAID): Secondary | ICD-10-CM | POA: Insufficient documentation

## 2013-06-17 DIAGNOSIS — F3289 Other specified depressive episodes: Secondary | ICD-10-CM | POA: Insufficient documentation

## 2013-06-17 DIAGNOSIS — Z87891 Personal history of nicotine dependence: Secondary | ICD-10-CM | POA: Insufficient documentation

## 2013-06-17 DIAGNOSIS — N390 Urinary tract infection, site not specified: Secondary | ICD-10-CM | POA: Insufficient documentation

## 2013-06-17 DIAGNOSIS — IMO0001 Reserved for inherently not codable concepts without codable children: Secondary | ICD-10-CM | POA: Insufficient documentation

## 2013-06-17 DIAGNOSIS — E079 Disorder of thyroid, unspecified: Secondary | ICD-10-CM | POA: Insufficient documentation

## 2013-06-17 DIAGNOSIS — Z79899 Other long term (current) drug therapy: Secondary | ICD-10-CM | POA: Insufficient documentation

## 2013-06-17 DIAGNOSIS — F329 Major depressive disorder, single episode, unspecified: Secondary | ICD-10-CM | POA: Insufficient documentation

## 2013-06-17 DIAGNOSIS — IMO0002 Reserved for concepts with insufficient information to code with codable children: Secondary | ICD-10-CM | POA: Insufficient documentation

## 2013-06-17 DIAGNOSIS — G43909 Migraine, unspecified, not intractable, without status migrainosus: Secondary | ICD-10-CM | POA: Insufficient documentation

## 2013-06-17 DIAGNOSIS — J4489 Other specified chronic obstructive pulmonary disease: Secondary | ICD-10-CM | POA: Insufficient documentation

## 2013-06-17 DIAGNOSIS — J449 Chronic obstructive pulmonary disease, unspecified: Secondary | ICD-10-CM | POA: Insufficient documentation

## 2013-06-17 MED ORDER — OXYCODONE-ACETAMINOPHEN 5-325 MG PO TABS
2.0000 | ORAL_TABLET | Freq: Once | ORAL | Status: AC
  Administered 2013-06-17: 2 via ORAL
  Filled 2013-06-17: qty 2

## 2013-06-17 NOTE — ED Notes (Signed)
Pt reports that she has been having mid, lower abdominal pain x1 week, with burning frequency and urgency of urination.  Pt reports OTC Azo taken at home without relief.  PCP told pt to come to ED

## 2013-06-17 NOTE — ED Provider Notes (Signed)
CSN: [id number]     Arrival date & time 06/17/13  2033 History   First MD Initiated Contact with Patient 06/17/13 2329     Chief Complaint  Patient presents with  . Abdominal Pain  . Urinary Tract Infection   (Consider location/radiation/quality/duration/timing/severity/associated sxs/prior Treatment) HPI Hx per PT - dysuria x 1 week, saw her physician and was told that she does not have a UTI.  She has frequency and urgency. Some LBP. No hematuria. No F/C. Has suprapubic discomfort midline, No ABD pain otherwise. Has h/o same with UTI. She was prescribed a gel that is not helping.  Past Medical History  Diagnosis Date  . Depression   . Thyroid disease   . Anxiety   . Obsessive-compulsive disorder   . Fatigue   . Asthma   . Sleep apnea   . COPD (chronic obstructive pulmonary disease)   . Collapsed lung     right lung  . Migraine   . Fibromyalgia   . Bell's palsy   . Dislocated shoulder   . 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 Abdominal hysterectomy    . Cesarean section    . Ans unit     . Gallbladder removed    . Dnc    . Gallbladder surgery      1998  . Ans      2001, 2010  . Cesarean section      2007  . Dilation and curettage of uterus      2007   Family History  Problem Relation Age of Onset  . Depression Mother   . Depression Maternal Grandmother    History  Substance Use Topics  . Smoking status: Former Smoker    Quit date: 06/25/2000  . Smokeless tobacco: Never Used  . Alcohol Use: No   OB History   Grav Para Term Preterm Abortions TAB SAB Ect Mult Living                 Review of Systems  Constitutional: Negative for fever and chills.  HENT: Negative for neck pain and neck stiffness.   Eyes: Negative for pain.  Respiratory: Negative for shortness of breath.   Cardiovascular: Negative for chest pain.  Gastrointestinal: Negative for vomiting and abdominal distention.  Genitourinary:  Positive for dysuria, urgency and frequency. Negative for vaginal bleeding and vaginal discharge.  Skin: Negative for rash.  Neurological: Negative for headaches.  All other systems reviewed and are negative.    Allergies  Ketorolac tromethamine and Tape  Home Medications   Current Outpatient Rx  Name  Route  Sig  Dispense  Refill  . amphetamine-dextroamphetamine (ADDERALL) 30 MG tablet   Oral   Take 30 mg by mouth 2 (two) times daily.         . benztropine (COGENTIN) 2 MG tablet   Oral   Take 2 mg by mouth daily.         Marland Kitchen BIOTIN PO   Oral   Take by mouth daily.         . budesonide-formoterol (SYMBICORT) 80-4.5 MCG/ACT inhaler   Inhalation   Inhale 2 puffs into the lungs 2 (two) times daily.         . busPIRone (BUSPAR) 10 MG tablet   Oral   Take 10 mg by mouth 4 (four) times daily.          . Calcium Carbonate-Vitamin D (CALCIUM 600 + D  PO)   Oral   Take 600 mg by mouth.         . cetirizine (ZYRTEC) 10 MG tablet   Oral   Take 10 mg by mouth daily.         . clorazepate (TRANXENE) 15 MG tablet   Oral   Take 15 mg by mouth 3 (three) times daily.         Marland Kitchen CRANBERRY PO   Oral   Take by mouth 3 (three) times daily. 84mg , 2 soft gels three times daily         . escitalopram (LEXAPRO) 20 MG tablet   Oral   Take 40 mg by mouth daily.         . fenofibrate (TRICOR) 48 MG tablet   Oral   Take 48 mg by mouth daily. Taken at bedtime         . fluticasone (FLONASE) 50 MCG/ACT nasal spray   Nasal   Place 2 sprays into the nose daily.         Marland Kitchen gabapentin (NEURONTIN) 400 MG capsule   Oral   Take 400 mg by mouth 4 (four) times daily.         Marland Kitchen levothyroxine (SYNTHROID, LEVOTHROID) 50 MCG tablet   Oral   Take 50 mcg by mouth daily.         . Lurasidone HCl (LATUDA) 60 MG TABS   Oral   Take 60 mg by mouth 2 (two) times daily.         . meloxicam (MOBIC) 15 MG tablet   Oral   Take 1 tablet (15 mg total) by mouth daily.   30  tablet   3   . methocarbamol (ROBAXIN) 750 MG tablet   Oral   Take 1 tablet (750 mg total) by mouth 2 (two) times daily. NEEDS TO GET MEDICINE FROM ORTHOPEDIST FURTHER   60 tablet   3   . oxycodone (OXY-IR) 5 MG capsule   Oral   Take 5 mg by mouth 3 (three) times daily.         . pravastatin (PRAVACHOL) 20 MG tablet   Oral   Take 20 mg by mouth daily.         . predniSONE (DELTASONE) 10 MG tablet   Oral   Take 10 mg by mouth daily.         . promethazine (PHENERGAN) 25 MG tablet   Oral   Take 25 mg by mouth every 6 (six) hours as needed for nausea.         . propranolol ER (INDERAL LA) 60 MG 24 hr capsule   Oral   Take 60 mg by mouth at bedtime.         . ranitidine (ZANTAC) 150 MG capsule   Oral   Take 150 mg by mouth 2 (two) times daily.         Marland Kitchen tiZANidine (ZANAFLEX) 4 MG capsule   Oral   Take 1 capsule (4 mg total) by mouth every evening.   90 capsule   3   . valACYclovir (VALTREX) 500 MG tablet   Oral   Take 500 mg by mouth daily.          BP 123/76  Pulse 81  Temp(Src) 98 F (36.7 C) (Oral)  Resp 16  Ht 5\' 3"  (1.6 m)  Wt 210 lb (95.255 kg)  BMI 37.21 kg/m2  SpO2 98% Physical Exam  Constitutional: She is oriented to  person, place, and time. She appears well-developed and well-nourished.  HENT:  Head: Normocephalic and atraumatic.  Mouth/Throat: Oropharynx is clear and moist.  Eyes: EOM are normal. Pupils are equal, round, and reactive to light.  Neck: Neck supple.  Cardiovascular: Normal rate, regular rhythm and intact distal pulses.   Pulmonary/Chest: Effort normal and breath sounds normal. No respiratory distress. She exhibits no tenderness.  Abdominal: Soft. Bowel sounds are normal. She exhibits no distension. There is no tenderness. There is no rebound and no guarding.  No CVAT  Musculoskeletal: Normal range of motion. She exhibits no edema.  Neurological: She is alert and oriented to person, place, and time.  Skin: Skin is  warm and dry.    ED Course  Procedures (including critical care time) Labs Review Labs Reviewed  URINALYSIS, ROUTINE W REFLEX MICROSCOPIC - Abnormal; Notable for the following:    Color, Urine ORANGE (*)    APPearance CLOUDY (*)    Nitrite POSITIVE (*)    Leukocytes, UA LARGE (*)    All other components within normal limits  URINE CULTURE  URINE MICROSCOPIC-ADD ON   Rocephin IM  Stable for d/c home, UTI precautions provided and verbalized as understood, plan f/u PCP recheck and follow up U Cx results   MDM  Dx: UTI  UA reviewed Medications provided VS and nurses notes reviewed and considered    Sunnie Nielsen, MD 06/18/13 [id number]

## 2013-06-18 LAB — URINE MICROSCOPIC-ADD ON

## 2013-06-18 LAB — URINALYSIS, ROUTINE W REFLEX MICROSCOPIC
Glucose, UA: NEGATIVE mg/dL
pH: 7 (ref 5.0–8.0)

## 2013-06-18 MED ORDER — LIDOCAINE HCL 2 % IJ SOLN
INTRAMUSCULAR | Status: AC
  Filled 2013-06-18: qty 20

## 2013-06-18 MED ORDER — CEFPODOXIME PROXETIL 100 MG PO TABS: 100.0000 mg | ORAL_TABLET | Freq: Two times a day (BID) | ORAL | Status: DC

## 2013-06-18 MED ORDER — CEFTRIAXONE SODIUM 1 G IJ SOLR
1.0000 g | Freq: Once | INTRAMUSCULAR | Status: AC
  Administered 2013-06-18: 1 g via INTRAMUSCULAR
  Filled 2013-06-18: qty 10

## 2013-06-18 MED ORDER — LIDOCAINE HCL 1 % IJ SOLN
INTRAMUSCULAR | Status: AC
  Administered 2013-06-18: 2.1 mL
  Filled 2013-06-18: qty 20

## 2013-06-19 LAB — URINE CULTURE

## 2013-07-27 ENCOUNTER — Telehealth: Payer: Self-pay | Admitting: Neurology

## 2013-07-27 NOTE — Telephone Encounter (Signed)
Spoke with husband and he will have patient return call for sooner appt and the name of medication that she is no longer taking

## 2013-07-27 NOTE — Telephone Encounter (Signed)
Patient has discont. Medicine (tizanipine), causing diarrhea, please call

## 2013-07-27 NOTE — Telephone Encounter (Signed)
Returned patients call. Ok to discontinue tizanidine due to adverse effects.

## 2013-07-27 NOTE — Telephone Encounter (Signed)
Can you or your CMA touch base with her about her request for an earlier appointment. Thanks.

## 2013-07-28 NOTE — Telephone Encounter (Signed)
Spoke with patient and she is doing ok, said that she will keep her 12/12 appt with Dr Vickey Huger

## 2013-08-03 ENCOUNTER — Emergency Department (HOSPITAL_COMMUNITY)
Admission: EM | Admit: 2013-08-03 | Discharge: 2013-08-04 | Disposition: A | Discharge status: Home / Self Care | Payer: PRIVATE HEALTH INSURANCE | Attending: Emergency Medicine | Admitting: Emergency Medicine

## 2013-08-03 ENCOUNTER — Encounter (HOSPITAL_COMMUNITY): Payer: Self-pay | Admitting: Emergency Medicine

## 2013-08-03 DIAGNOSIS — Z8739 Personal history of other diseases of the musculoskeletal system and connective tissue: Secondary | ICD-10-CM | POA: Insufficient documentation

## 2013-08-03 DIAGNOSIS — J4489 Other specified chronic obstructive pulmonary disease: Secondary | ICD-10-CM | POA: Insufficient documentation

## 2013-08-03 DIAGNOSIS — F411 Generalized anxiety disorder: Secondary | ICD-10-CM | POA: Insufficient documentation

## 2013-08-03 DIAGNOSIS — F3289 Other specified depressive episodes: Secondary | ICD-10-CM | POA: Insufficient documentation

## 2013-08-03 DIAGNOSIS — R110 Nausea: Secondary | ICD-10-CM | POA: Insufficient documentation

## 2013-08-03 DIAGNOSIS — R109 Unspecified abdominal pain: Secondary | ICD-10-CM

## 2013-08-03 DIAGNOSIS — R197 Diarrhea, unspecified: Secondary | ICD-10-CM | POA: Insufficient documentation

## 2013-08-03 DIAGNOSIS — R1012 Left upper quadrant pain: Secondary | ICD-10-CM | POA: Insufficient documentation

## 2013-08-03 DIAGNOSIS — R1032 Left lower quadrant pain: Secondary | ICD-10-CM | POA: Insufficient documentation

## 2013-08-03 DIAGNOSIS — IMO0002 Reserved for concepts with insufficient information to code with codable children: Secondary | ICD-10-CM | POA: Insufficient documentation

## 2013-08-03 DIAGNOSIS — Z87828 Personal history of other (healed) physical injury and trauma: Secondary | ICD-10-CM | POA: Insufficient documentation

## 2013-08-03 DIAGNOSIS — F329 Major depressive disorder, single episode, unspecified: Secondary | ICD-10-CM | POA: Insufficient documentation

## 2013-08-03 DIAGNOSIS — Z79899 Other long term (current) drug therapy: Secondary | ICD-10-CM | POA: Insufficient documentation

## 2013-08-03 DIAGNOSIS — G510 Bell's palsy: Secondary | ICD-10-CM | POA: Insufficient documentation

## 2013-08-03 DIAGNOSIS — Z90710 Acquired absence of both cervix and uterus: Secondary | ICD-10-CM | POA: Insufficient documentation

## 2013-08-03 DIAGNOSIS — E079 Disorder of thyroid, unspecified: Secondary | ICD-10-CM | POA: Insufficient documentation

## 2013-08-03 DIAGNOSIS — F429 Obsessive-compulsive disorder, unspecified: Secondary | ICD-10-CM | POA: Insufficient documentation

## 2013-08-03 DIAGNOSIS — G43909 Migraine, unspecified, not intractable, without status migrainosus: Secondary | ICD-10-CM | POA: Insufficient documentation

## 2013-08-03 DIAGNOSIS — Z87891 Personal history of nicotine dependence: Secondary | ICD-10-CM | POA: Insufficient documentation

## 2013-08-03 DIAGNOSIS — J449 Chronic obstructive pulmonary disease, unspecified: Secondary | ICD-10-CM | POA: Insufficient documentation

## 2013-08-03 LAB — URINALYSIS W MICROSCOPIC + REFLEX CULTURE
Bilirubin Urine: NEGATIVE
Glucose, UA: NEGATIVE mg/dL
Hgb urine dipstick: NEGATIVE
Ketones, ur: NEGATIVE mg/dL
Nitrite: NEGATIVE
Protein, ur: NEGATIVE mg/dL
Specific Gravity, Urine: 1.033 — ABNORMAL HIGH (ref 1.005–1.030)
Urobilinogen, UA: 0.2 mg/dL (ref 0.0–1.0)

## 2013-08-03 LAB — COMPREHENSIVE METABOLIC PANEL
ALT: 12 U/L (ref 0–35)
AST: 15 U/L (ref 0–37)
Alkaline Phosphatase: 69 U/L (ref 39–117)
CO2: 24 mEq/L (ref 19–32)
Calcium: 10.1 mg/dL (ref 8.4–10.5)
GFR calc non Af Amer: 66 mL/min — ABNORMAL LOW (ref >90)
Potassium: 3.4 mEq/L — ABNORMAL LOW (ref 3.5–5.1)
Sodium: 135 mEq/L (ref 135–145)

## 2013-08-03 LAB — CBC WITH DIFFERENTIAL/PLATELET
Basophils Absolute: 0 10*3/uL (ref 0.0–0.1)
Basophils Relative: 0 % (ref 0–1)
Eosinophils Relative: 2 % (ref 0–5)
Lymphocytes Relative: 28 % (ref 12–46)
Lymphs Abs: 3.1 10*3/uL (ref 0.7–4.0)
MCHC: 35.2 g/dL (ref 30.0–36.0)
MCV: 81.2 fL (ref 78.0–100.0)
Neutro Abs: 6.8 10*3/uL (ref 1.7–7.7)
Neutrophils Relative %: 62 % (ref 43–77)
Platelets: 254 10*3/uL (ref 150–400)
RBC: 4.79 MIL/uL (ref 3.87–5.11)
RDW: 13.3 % (ref 11.5–15.5)
WBC: 10.9 10*3/uL — ABNORMAL HIGH (ref 4.0–10.5)

## 2013-08-03 MED ORDER — SODIUM CHLORIDE 0.9 % IV BOLUS (SEPSIS)
1000.0000 mL | Freq: Once | INTRAVENOUS | Status: AC
  Administered 2013-08-03: 1000 mL via INTRAVENOUS

## 2013-08-03 MED ORDER — ONDANSETRON HCL 4 MG/2ML IJ SOLN
4.0000 mg | Freq: Once | INTRAMUSCULAR | Status: AC
  Administered 2013-08-03: 4 mg via INTRAVENOUS
  Filled 2013-08-03: qty 2

## 2013-08-03 MED ORDER — HYDROMORPHONE HCL PF 1 MG/ML IJ SOLN
0.5000 mg | Freq: Once | INTRAMUSCULAR | Status: AC
  Administered 2013-08-03: 0.5 mg via INTRAVENOUS
  Filled 2013-08-03: qty 1

## 2013-08-03 NOTE — ED Notes (Signed)
Pt complains of abd pain and diarrhea since this am, was seen by primary Dr and was sent here for further evaluation of possible pancreatitis

## 2013-08-03 NOTE — ED Provider Notes (Signed)
CSN: [id number]     Arrival date & time 08/03/13  2106 History   First MD Initiated Contact with Patient 08/03/13 2212     Chief Complaint  Patient presents with  . Abdominal Pain   (Consider location/radiation/quality/duration/timing/severity/associated sxs/prior Treatment) HPI [name] is a 42 y.o. female who presents to emergency department complaining of abdominal pain, nausea. States pain began this morning. Pain is sharp. Pain is mainly in the upper left abdomen, radiating into the left lower abdomen. Patient denies any fever, chills, vomiting, diarrhea. Patient states that she has prior similar pain and was diagnosed with pancreatitis. States was in the hospital 7 months with pancreatitis. States she called her doctor who told her to come here for evaluation. Patient states she has had: Cystectomy, hysterectomy on her abdomen. She denies any sick contacts. States pain is worsened by movement and palpation of the abdomen. Nothing makes her pain better. She did not take any medications today to help her symptoms.  Past Medical History  Diagnosis Date  . Depression   . Thyroid disease   . Anxiety   . Obsessive-compulsive disorder   . Fatigue   . Asthma   . Sleep apnea   . COPD (chronic obstructive pulmonary disease)   . Collapsed lung     right lung  . Migraine   . Fibromyalgia   . Bell's palsy   . Dislocated shoulder   . 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 Abdominal hysterectomy    . Cesarean section    . Ans unit     . Gallbladder removed    . Dnc    . Gallbladder surgery      1998  . Ans      2001, 2010  . Cesarean section      2007  . Dilation and curettage of uterus      2007   Family History  Problem Relation Age of Onset  . Depression Mother   . Depression Maternal Grandmother    History  Substance Use Topics  . Smoking status: Former Smoker    Quit date: 06/25/2000  . Smokeless tobacco: Never  Used  . Alcohol Use: No   OB History   Grav Para Term Preterm Abortions TAB SAB Ect Mult Living                 Review of Systems  Constitutional: Negative for fever and chills.  Respiratory: Negative for cough, chest tightness and shortness of breath.   Cardiovascular: Negative for chest pain, palpitations and leg swelling.  Gastrointestinal: Positive for nausea and abdominal pain. Negative for vomiting, diarrhea, constipation and blood in stool.  Genitourinary: Negative for dysuria and flank pain.  Musculoskeletal: Negative for arthralgias, myalgias, neck pain and neck stiffness.  Skin: Negative for rash.  Neurological: Negative for dizziness, weakness and headaches.  All other systems reviewed and are negative.    Allergies  Ketorolac tromethamine and Tape  Home Medications   Current Outpatient Rx  Name  Route  Sig  Dispense  Refill  . amphetamine-dextroamphetamine (ADDERALL) 20 MG tablet   Oral   Take 20-40 mg by mouth 2 (two) times daily. Take 2 tablets in the morning Take 1 tablet at noon         . benztropine (COGENTIN) 2 MG tablet   Oral   Take 2 mg by mouth daily.         . budesonide-formoterol (  SYMBICORT) 80-4.5 MCG/ACT inhaler   Inhalation   Inhale 2 puffs into the lungs 2 (two) times daily.         . busPIRone (BUSPAR) 10 MG tablet   Oral   Take 10 mg by mouth 4 (four) times daily.          . cetirizine (ZYRTEC) 10 MG tablet   Oral   Take 10 mg by mouth daily.         . clorazepate (TRANXENE) 15 MG tablet   Oral   Take 15 mg by mouth 2 (two) times daily.          Marland Kitchen escitalopram (LEXAPRO) 20 MG tablet   Oral   Take 40 mg by mouth daily.         . fenofibrate (TRICOR) 48 MG tablet   Oral   Take 48 mg by mouth daily. Taken at bedtime         . fluticasone (FLONASE) 50 MCG/ACT nasal spray   Nasal   Place 2 sprays into the nose daily.         Marland Kitchen gabapentin (NEURONTIN) 400 MG capsule   Oral   Take 400 mg by mouth 4 (four)  times daily.         . hydrochlorothiazide (HYDRODIURIL) 25 MG tablet   Oral   Take 25 mg by mouth daily.         Marland Kitchen levothyroxine (SYNTHROID, LEVOTHROID) 50 MCG tablet   Oral   Take 50 mcg by mouth daily.         Marland Kitchen loperamide (IMODIUM) 2 MG capsule   Oral   Take 2 mg by mouth daily as needed for diarrhea or loose stools. For diarrhea         . Lurasidone HCl (LATUDA) 60 MG TABS   Oral   Take 60 mg by mouth 2 (two) times daily.         . pravastatin (PRAVACHOL) 20 MG tablet   Oral   Take 20 mg by mouth daily.         . propranolol ER (INDERAL LA) 60 MG 24 hr capsule   Oral   Take 60 mg by mouth at bedtime.         . ranitidine (ZANTAC) 150 MG capsule   Oral   Take 150 mg by mouth 2 (two) times daily.         . valACYclovir (VALTREX) 500 MG tablet   Oral   Take 500 mg by mouth daily.          BP 119/77  Pulse 105  Temp(Src) 98.3 F (36.8 C) (Oral)  Resp 16  SpO2 97% Physical Exam  Nursing note and vitals reviewed. Constitutional: She is oriented to person, place, and time. She appears well-developed and well-nourished. No distress.  HENT:  Head: Normocephalic.  Eyes: Conjunctivae are normal.  Neck: Neck supple.  Cardiovascular: Normal rate, regular rhythm and normal heart sounds.   Pulmonary/Chest: Effort normal and breath sounds normal. No respiratory distress. She has no wheezes. She has no rales.  Abdominal: Soft. Bowel sounds are normal. She exhibits no distension. There is tenderness. There is no rebound.  Left upper quadrant and left lower quadrant tenderness  Musculoskeletal: She exhibits no edema.  Neurological: She is alert and oriented to person, place, and time.  Right facial droop involving her right eye and right eyebrow. He should states she has Bell's palsy  Skin: Skin is warm and dry.  Psychiatric:  She has a normal mood and affect. Her behavior is normal.    ED Course  Procedures (including critical care time) Labs  Review Labs Reviewed  CBC WITH DIFFERENTIAL - Abnormal; Notable for the following:    WBC 10.9 (*)    All other components within normal limits  COMPREHENSIVE METABOLIC PANEL - Abnormal; Notable for the following:    Potassium 3.4 (*)    Glucose, Bld 102 (*)    GFR calc non Af Amer 66 (*)    GFR calc Af Amer 76 (*)    All other components within normal limits  LIPASE, BLOOD - Abnormal; Notable for the following:    Lipase 69 (*)    All other components within normal limits  URINALYSIS W MICROSCOPIC + REFLEX CULTURE - Abnormal; Notable for the following:    Color, Urine AMBER (*)    Specific Gravity, Urine 1.033 (*)    All other components within normal limits   Imaging Review No results found.  EKG Interpretation   None       MDM  No diagnosis found.   Pt with abdominal pain, diarrhea since this morning. Pain similar to prior pancreatitis. Will get labs, ua.  Pain meds and antiemetics ordered.   12:15 AM Pain not improved. Will get CT abd pelvis. WBC 10.9. Lipase 69.  Will try more pain medications.     Lottie Mussel, PA-C 08/08/13 1453

## 2013-08-04 ENCOUNTER — Encounter (HOSPITAL_COMMUNITY): Payer: Self-pay

## 2013-08-04 ENCOUNTER — Emergency Department (HOSPITAL_COMMUNITY): Payer: PRIVATE HEALTH INSURANCE

## 2013-08-04 MED ORDER — ONDANSETRON HCL 8 MG PO TABS: 8.0000 mg | ORAL_TABLET | Freq: Three times a day (TID) | ORAL | Status: DC | PRN

## 2013-08-04 MED ORDER — DIPHENHYDRAMINE HCL 50 MG/ML IJ SOLN
25.0000 mg | Freq: Once | INTRAMUSCULAR | Status: AC
  Administered 2013-08-04: 25 mg via INTRAVENOUS
  Filled 2013-08-04: qty 1

## 2013-08-04 MED ORDER — IOHEXOL 300 MG/ML  SOLN
100.0000 mL | Freq: Once | INTRAMUSCULAR | Status: AC | PRN
  Administered 2013-08-04: 100 mL via INTRAVENOUS

## 2013-08-04 MED ORDER — IOHEXOL 300 MG/ML  SOLN
50.0000 mL | Freq: Once | INTRAMUSCULAR | Status: AC | PRN
  Administered 2013-08-04: 50 mL via ORAL

## 2013-08-04 MED ORDER — IOHEXOL 300 MG/ML  SOLN: 100.0000 mL | Freq: Once | INTRAMUSCULAR | Status: DC | PRN

## 2013-08-04 MED ORDER — HYDROMORPHONE HCL PF 1 MG/ML IJ SOLN
1.0000 mg | Freq: Once | INTRAMUSCULAR | Status: AC
  Administered 2013-08-04: 1 mg via INTRAVENOUS
  Filled 2013-08-04: qty 1

## 2013-08-04 NOTE — ED Provider Notes (Signed)
Pt received from [place], [place].  Pt presented to ED w/ LUQ pain.  H/o pancreatitis.  Labs sig for slightly elevated lipase at 69.  CT non-acute.  Results discussed w/ pt, including cyst of L overy and kidney.  Will treat symptomatically for mild pancreatitis.  She has a pain management physician at [facility].  Prescribed zofran and recommended clear fluids for 24-48 hours.  Referred to GI for recurrent/persistent sx.  Return precautions discussed.  2:36 AM   Otilio Miu, PA-C 08/04/13 [phone]

## 2013-08-05 ENCOUNTER — Emergency Department (HOSPITAL_COMMUNITY)
Admission: EM | Admit: 2013-08-05 | Discharge: 2013-08-06 | Disposition: A | Discharge status: Home / Self Care | Payer: PRIVATE HEALTH INSURANCE | Attending: Emergency Medicine | Admitting: Emergency Medicine

## 2013-08-05 ENCOUNTER — Encounter (HOSPITAL_COMMUNITY): Payer: Self-pay | Admitting: Emergency Medicine

## 2013-08-05 DIAGNOSIS — Z8669 Personal history of other diseases of the nervous system and sense organs: Secondary | ICD-10-CM | POA: Insufficient documentation

## 2013-08-05 DIAGNOSIS — B029 Zoster without complications: Secondary | ICD-10-CM

## 2013-08-05 DIAGNOSIS — IMO0002 Reserved for concepts with insufficient information to code with codable children: Secondary | ICD-10-CM | POA: Insufficient documentation

## 2013-08-05 DIAGNOSIS — F429 Obsessive-compulsive disorder, unspecified: Secondary | ICD-10-CM | POA: Insufficient documentation

## 2013-08-05 DIAGNOSIS — Z8739 Personal history of other diseases of the musculoskeletal system and connective tissue: Secondary | ICD-10-CM | POA: Insufficient documentation

## 2013-08-05 DIAGNOSIS — F329 Major depressive disorder, single episode, unspecified: Secondary | ICD-10-CM | POA: Insufficient documentation

## 2013-08-05 DIAGNOSIS — Z3202 Encounter for pregnancy test, result negative: Secondary | ICD-10-CM | POA: Insufficient documentation

## 2013-08-05 DIAGNOSIS — Z79899 Other long term (current) drug therapy: Secondary | ICD-10-CM | POA: Insufficient documentation

## 2013-08-05 DIAGNOSIS — J4489 Other specified chronic obstructive pulmonary disease: Secondary | ICD-10-CM | POA: Insufficient documentation

## 2013-08-05 DIAGNOSIS — J449 Chronic obstructive pulmonary disease, unspecified: Secondary | ICD-10-CM | POA: Insufficient documentation

## 2013-08-05 DIAGNOSIS — G43909 Migraine, unspecified, not intractable, without status migrainosus: Secondary | ICD-10-CM | POA: Insufficient documentation

## 2013-08-05 DIAGNOSIS — F3289 Other specified depressive episodes: Secondary | ICD-10-CM | POA: Insufficient documentation

## 2013-08-05 DIAGNOSIS — R109 Unspecified abdominal pain: Secondary | ICD-10-CM | POA: Insufficient documentation

## 2013-08-05 DIAGNOSIS — Z87828 Personal history of other (healed) physical injury and trauma: Secondary | ICD-10-CM | POA: Insufficient documentation

## 2013-08-05 DIAGNOSIS — F411 Generalized anxiety disorder: Secondary | ICD-10-CM | POA: Insufficient documentation

## 2013-08-05 DIAGNOSIS — Z87891 Personal history of nicotine dependence: Secondary | ICD-10-CM | POA: Insufficient documentation

## 2013-08-05 DIAGNOSIS — E079 Disorder of thyroid, unspecified: Secondary | ICD-10-CM | POA: Insufficient documentation

## 2013-08-05 LAB — COMPREHENSIVE METABOLIC PANEL
ALT: 75 U/L — ABNORMAL HIGH (ref 0–35)
Alkaline Phosphatase: 109 U/L (ref 39–117)
CO2: 26 mEq/L (ref 19–32)
Chloride: 100 mEq/L (ref 96–112)
GFR calc Af Amer: 81 mL/min — ABNORMAL LOW (ref >90)
GFR calc non Af Amer: 69 mL/min — ABNORMAL LOW (ref >90)
Glucose, Bld: 132 mg/dL — ABNORMAL HIGH (ref 70–99)
Potassium: 3.4 mEq/L — ABNORMAL LOW (ref 3.5–5.1)
Sodium: 136 mEq/L (ref 135–145)
Total Bilirubin: 0.5 mg/dL (ref 0.3–1.2)
Total Protein: 7.4 g/dL (ref 6.0–8.3)

## 2013-08-05 LAB — URINALYSIS, ROUTINE W REFLEX MICROSCOPIC
Bilirubin Urine: NEGATIVE
Glucose, UA: NEGATIVE mg/dL
Hgb urine dipstick: NEGATIVE
Specific Gravity, Urine: 1.021 (ref 1.005–1.030)
Urobilinogen, UA: 1 mg/dL (ref 0.0–1.0)

## 2013-08-05 LAB — CBC WITH DIFFERENTIAL/PLATELET
Eosinophils Absolute: 0.2 10*3/uL (ref 0.0–0.7)
Eosinophils Relative: 3 % (ref 0–5)
HCT: 36.8 % (ref 36.0–46.0)
Hemoglobin: 12.9 g/dL (ref 12.0–15.0)
Lymphocytes Relative: 25 % (ref 12–46)
Lymphs Abs: 1.9 10*3/uL (ref 0.7–4.0)
MCH: 28.5 pg (ref 26.0–34.0)
MCHC: 35.1 g/dL (ref 30.0–36.0)
Monocytes Relative: 9 % (ref 3–12)
Neutrophils Relative %: 63 % (ref 43–77)
Platelets: 224 10*3/uL (ref 150–400)
RBC: 4.53 MIL/uL (ref 3.87–5.11)
WBC: 7.5 10*3/uL (ref 4.0–10.5)

## 2013-08-05 MED ORDER — MORPHINE SULFATE 4 MG/ML IJ SOLN
4.0000 mg | Freq: Once | INTRAMUSCULAR | Status: AC
  Administered 2013-08-05: 4 mg via INTRAVENOUS
  Filled 2013-08-05: qty 1

## 2013-08-05 MED ORDER — MORPHINE SULFATE 4 MG/ML IJ SOLN
4.0000 mg | Freq: Once | INTRAMUSCULAR | Status: AC
  Administered 2013-08-06: 4 mg via INTRAVENOUS
  Filled 2013-08-05: qty 1

## 2013-08-05 MED ORDER — ONDANSETRON HCL 4 MG/2ML IJ SOLN
4.0000 mg | Freq: Once | INTRAMUSCULAR | Status: AC
  Administered 2013-08-05: 4 mg via INTRAVENOUS
  Filled 2013-08-05: qty 2

## 2013-08-05 MED ORDER — SODIUM CHLORIDE 0.9 % IV SOLN
Freq: Once | INTRAVENOUS | Status: AC
  Administered 2013-08-05: 23:00:00 via INTRAVENOUS

## 2013-08-05 MED ORDER — GI COCKTAIL ~~LOC~~
30.0000 mL | Freq: Once | ORAL | Status: AC
  Administered 2013-08-05: 30 mL via ORAL
  Filled 2013-08-05: qty 30

## 2013-08-05 NOTE — ED Notes (Signed)
Pt states she was seen here Monday night for same  Pt states she was sent home and told not to eat for 2 days and to resume diet today  Pt states that today the pain came back along with the diarrhea  Pt states the dr on call told her to come in
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
## 2013-08-06 MED ORDER — ACYCLOVIR 800 MG PO TABS: 800.0000 mg | ORAL_TABLET | Freq: Every day | ORAL | Status: DC

## 2013-08-06 MED ORDER — RANITIDINE HCL 150 MG PO TABS: 150.0000 mg | ORAL_TABLET | Freq: Two times a day (BID) | ORAL | Status: DC

## 2013-08-06 MED ORDER — OXYCODONE-ACETAMINOPHEN 5-325 MG PO TABS: 2.0000 | ORAL_TABLET | ORAL | Status: DC | PRN

## 2013-08-08 NOTE — ED Provider Notes (Signed)
Medical screening examination/treatment/procedure(s) were performed by non-physician practitioner and as supervising physician I was immediately available for consultation/collaboration.  EKG Interpretation   None         David H Yao, MD 08/08/13 1052 

## 2013-08-08 NOTE — ED Provider Notes (Signed)
Medical screening examination/treatment/procedure(s) were performed by non-physician practitioner and as supervising physician I was immediately available for consultation/collaboration.  EKG Interpretation   None         David H Yao, MD 08/08/13 1951 

## 2013-08-28 ENCOUNTER — Telehealth: Payer: Self-pay | Admitting: Neurology

## 2013-08-28 ENCOUNTER — Ambulatory Visit: Payer: Self-pay | Admitting: Neurology

## 2013-08-28 NOTE — Telephone Encounter (Signed)
Patient missed worked in appt today states she forgot and then had to pick her daughter up. Pt was very apologetic and wants to be worked in again

## 2013-08-28 NOTE — Telephone Encounter (Signed)
Please advise 

## 2013-08-28 NOTE — Telephone Encounter (Signed)
i have literally no appointments left.

## 2013-08-31 NOTE — Telephone Encounter (Signed)
Called patient and reschedule her appt. Per Dr. Vickey Huger on 09/03/13 at 1:00 pm. I advised the patient that if she has any other problems, questions or concerns to call the office. Patient verbalized understanding.

## 2013-09-03 ENCOUNTER — Ambulatory Visit (INDEPENDENT_AMBULATORY_CARE_PROVIDER_SITE_OTHER): Payer: PRIVATE HEALTH INSURANCE | Admitting: Neurology

## 2013-09-03 ENCOUNTER — Encounter: Payer: Self-pay | Admitting: Neurology

## 2013-09-03 ENCOUNTER — Encounter (INDEPENDENT_AMBULATORY_CARE_PROVIDER_SITE_OTHER): Payer: Self-pay

## 2013-09-03 VITALS — BP 104/72 | HR 74 | Ht 62.0 in | Wt 218.0 lb

## 2013-09-03 DIAGNOSIS — G4733 Obstructive sleep apnea (adult) (pediatric): Secondary | ICD-10-CM

## 2013-09-03 DIAGNOSIS — G510 Bell's palsy: Secondary | ICD-10-CM

## 2013-09-03 DIAGNOSIS — G4730 Sleep apnea, unspecified: Secondary | ICD-10-CM | POA: Insufficient documentation
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
## 2013-09-03 NOTE — Patient Instructions (Signed)

## 2013-09-14 ENCOUNTER — Encounter: Payer: Self-pay | Admitting: Neurology

## 2013-10-09 ENCOUNTER — Encounter (HOSPITAL_COMMUNITY): Payer: Self-pay | Admitting: Emergency Medicine

## 2013-10-09 ENCOUNTER — Emergency Department (HOSPITAL_COMMUNITY): Payer: No Typology Code available for payment source

## 2013-10-09 ENCOUNTER — Emergency Department (HOSPITAL_COMMUNITY)
Admission: EM | Admit: 2013-10-09 | Discharge: 2013-10-09 | Disposition: A | Discharge status: Home / Self Care | Payer: No Typology Code available for payment source | Attending: Emergency Medicine | Admitting: Emergency Medicine

## 2013-10-09 DIAGNOSIS — F3289 Other specified depressive episodes: Secondary | ICD-10-CM | POA: Diagnosis not present

## 2013-10-09 DIAGNOSIS — S0990XA Unspecified injury of head, initial encounter: Secondary | ICD-10-CM | POA: Diagnosis not present

## 2013-10-09 DIAGNOSIS — S239XXA Sprain of unspecified parts of thorax, initial encounter: Secondary | ICD-10-CM | POA: Insufficient documentation

## 2013-10-09 DIAGNOSIS — IMO0002 Reserved for concepts with insufficient information to code with codable children: Secondary | ICD-10-CM | POA: Insufficient documentation

## 2013-10-09 DIAGNOSIS — J449 Chronic obstructive pulmonary disease, unspecified: Secondary | ICD-10-CM | POA: Insufficient documentation

## 2013-10-09 DIAGNOSIS — S0993XA Unspecified injury of face, initial encounter: Secondary | ICD-10-CM | POA: Diagnosis not present

## 2013-10-09 DIAGNOSIS — IMO0001 Reserved for inherently not codable concepts without codable children: Secondary | ICD-10-CM | POA: Insufficient documentation

## 2013-10-09 DIAGNOSIS — G510 Bell's palsy: Secondary | ICD-10-CM | POA: Diagnosis not present

## 2013-10-09 DIAGNOSIS — F329 Major depressive disorder, single episode, unspecified: Secondary | ICD-10-CM | POA: Diagnosis not present

## 2013-10-09 DIAGNOSIS — Y9389 Activity, other specified: Secondary | ICD-10-CM | POA: Insufficient documentation

## 2013-10-09 DIAGNOSIS — S199XXA Unspecified injury of neck, initial encounter: Secondary | ICD-10-CM

## 2013-10-09 DIAGNOSIS — F429 Obsessive-compulsive disorder, unspecified: Secondary | ICD-10-CM | POA: Insufficient documentation

## 2013-10-09 DIAGNOSIS — J4489 Other specified chronic obstructive pulmonary disease: Secondary | ICD-10-CM | POA: Insufficient documentation

## 2013-10-09 DIAGNOSIS — G43909 Migraine, unspecified, not intractable, without status migrainosus: Secondary | ICD-10-CM | POA: Insufficient documentation

## 2013-10-09 DIAGNOSIS — Y92410 Unspecified street and highway as the place of occurrence of the external cause: Secondary | ICD-10-CM | POA: Insufficient documentation

## 2013-10-09 DIAGNOSIS — E079 Disorder of thyroid, unspecified: Secondary | ICD-10-CM | POA: Insufficient documentation

## 2013-10-09 DIAGNOSIS — Z79899 Other long term (current) drug therapy: Secondary | ICD-10-CM | POA: Diagnosis not present

## 2013-10-09 DIAGNOSIS — Z87828 Personal history of other (healed) physical injury and trauma: Secondary | ICD-10-CM | POA: Insufficient documentation

## 2013-10-09 DIAGNOSIS — S29012A Strain of muscle and tendon of back wall of thorax, initial encounter: Secondary | ICD-10-CM

## 2013-10-09 DIAGNOSIS — Z87898 Personal history of other specified conditions: Secondary | ICD-10-CM | POA: Diagnosis not present

## 2013-10-09 DIAGNOSIS — Z8768 Personal history of other (corrected) conditions arising in the perinatal period: Secondary | ICD-10-CM | POA: Insufficient documentation

## 2013-10-09 DIAGNOSIS — Z87891 Personal history of nicotine dependence: Secondary | ICD-10-CM | POA: Diagnosis not present

## 2013-10-09 DIAGNOSIS — F411 Generalized anxiety disorder: Secondary | ICD-10-CM | POA: Diagnosis not present

## 2013-10-09 MED ORDER — HYDROCODONE-ACETAMINOPHEN 5-325 MG PO TABS
2.0000 | ORAL_TABLET | Freq: Once | ORAL | Status: AC
  Administered 2013-10-09: 2 via ORAL
  Filled 2013-10-09: qty 2

## 2013-10-09 NOTE — ED Notes (Signed)
Ambulated pt to bathroom. No assistance needed.

## 2013-10-09 NOTE — Discharge Instructions (Signed)
If you were given medicines take as directed.  If you are on coumadin or contraceptives realize their levels and effectiveness is altered by many different medicines.  If you have any reaction (rash, tongues swelling, other) to the medicines stop taking and see a physician.   Please follow up as directed and return to the ER or see a physician for new or worsening symptoms.  Thank you.  Back Pain, Adult Back pain is very common. The pain often gets better over time. The cause of back pain is usually not dangerous. Most people can learn to manage their back pain on their own.  HOME CARE   Stay active. Start with short walks on flat ground if you can. Try to walk farther each day.  Do not sit, drive, or stand in one place for more than 30 minutes. Do not stay in bed.  Do not avoid exercise or work. Activity can help your back heal faster.  Be careful when you bend or lift an object. Bend at your knees, keep the object close to you, and do not twist.  Sleep on a firm mattress. Lie on your side, and bend your knees. If you lie on your back, put a pillow under your knees.  Only take medicines as told by your doctor.  Put ice on the injured area.  Put ice in a plastic bag.  Place a towel between your skin and the bag.  Leave the ice on for 15-20 minutes, 03-04 times a day for the first 2 to 3 days. After that, you can switch between ice and heat packs.  Ask your doctor about back exercises or massage.  Avoid feeling anxious or stressed. Find good ways to deal with stress, such as exercise. GET HELP RIGHT AWAY IF:   Your pain does not go away with rest or medicine.  Your pain does not go away in 1 week.  You have new problems.  You do not feel well.  The pain spreads into your legs.  You cannot control when you poop (bowel movement) or pee (urinate).  Your arms or legs feel weak or lose feeling (numbness).  You feel sick to your stomach (nauseous) or throw up (vomit).  You  have belly (abdominal) pain.  You feel like you may pass out (faint). MAKE SURE YOU:   Understand these instructions.  Will watch your condition.  Will get help right away if you are not doing well or get worse. Document Released: 02/20/2008 Document Revised: 11/26/2011 Document Reviewed: 01/22/2011 [facility] Patient Information 2014 [place].
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
## 2013-11-05 ENCOUNTER — Telehealth: Payer: Self-pay | Admitting: Neurology
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
## 2013-11-06 NOTE — Telephone Encounter (Signed)
Called to speak with patient, someone answered and stated she was sleeping.  I told them I would try to reach her later on today.

## 2013-11-26 NOTE — Telephone Encounter (Signed)
Called and patient wasn't home, left message with husband to have patient call me back. -sh

## 2013-12-29 ENCOUNTER — Ambulatory Visit: Payer: PRIVATE HEALTH INSURANCE | Admitting: Neurology

## 2014-01-05 DIAGNOSIS — B009 Herpesviral infection, unspecified: Secondary | ICD-10-CM | POA: Insufficient documentation

## 2014-01-18 ENCOUNTER — Encounter (HOSPITAL_COMMUNITY): Payer: Self-pay | Admitting: Emergency Medicine

## 2014-01-18 ENCOUNTER — Emergency Department (HOSPITAL_COMMUNITY)
Admission: EM | Admit: 2014-01-18 | Discharge: 2014-01-19 | Disposition: A | Discharge status: Home / Self Care | Payer: PRIVATE HEALTH INSURANCE | Attending: Emergency Medicine | Admitting: Emergency Medicine

## 2014-01-18 DIAGNOSIS — Z79899 Other long term (current) drug therapy: Secondary | ICD-10-CM | POA: Diagnosis not present

## 2014-01-18 DIAGNOSIS — F429 Obsessive-compulsive disorder, unspecified: Secondary | ICD-10-CM | POA: Insufficient documentation

## 2014-01-18 DIAGNOSIS — G43909 Migraine, unspecified, not intractable, without status migrainosus: Secondary | ICD-10-CM | POA: Diagnosis not present

## 2014-01-18 DIAGNOSIS — F411 Generalized anxiety disorder: Secondary | ICD-10-CM | POA: Insufficient documentation

## 2014-01-18 DIAGNOSIS — Z9889 Other specified postprocedural states: Secondary | ICD-10-CM | POA: Diagnosis not present

## 2014-01-18 DIAGNOSIS — G510 Bell's palsy: Secondary | ICD-10-CM | POA: Insufficient documentation

## 2014-01-18 DIAGNOSIS — F329 Major depressive disorder, single episode, unspecified: Secondary | ICD-10-CM | POA: Insufficient documentation

## 2014-01-18 DIAGNOSIS — M542 Cervicalgia: Secondary | ICD-10-CM | POA: Insufficient documentation

## 2014-01-18 DIAGNOSIS — Z885 Allergy status to narcotic agent status: Secondary | ICD-10-CM | POA: Insufficient documentation

## 2014-01-18 DIAGNOSIS — F3289 Other specified depressive episodes: Secondary | ICD-10-CM | POA: Insufficient documentation

## 2014-01-18 DIAGNOSIS — Z87891 Personal history of nicotine dependence: Secondary | ICD-10-CM | POA: Diagnosis not present

## 2014-01-18 DIAGNOSIS — Z888 Allergy status to other drugs, medicaments and biological substances status: Secondary | ICD-10-CM | POA: Insufficient documentation

## 2014-01-18 DIAGNOSIS — J449 Chronic obstructive pulmonary disease, unspecified: Secondary | ICD-10-CM | POA: Diagnosis not present

## 2014-01-18 DIAGNOSIS — J4489 Other specified chronic obstructive pulmonary disease: Secondary | ICD-10-CM | POA: Insufficient documentation

## 2014-01-18 DIAGNOSIS — Z5189 Encounter for other specified aftercare: Secondary | ICD-10-CM

## 2014-01-18 DIAGNOSIS — E079 Disorder of thyroid, unspecified: Secondary | ICD-10-CM | POA: Insufficient documentation

## 2014-01-18 NOTE — ED Notes (Addendum)
Pt. reports reddness at incision site of her neck surgery done at [facility] last 01/11/2014 by Dr. Danelle Berry , denies drainage , staples intact . No fever or chills. Currently taking Keflex oral antibiotic and Roxicodone for pain .

## 2014-01-19 MED ORDER — CEPHALEXIN 250 MG PO CAPS: 250.0000 mg | ORAL_CAPSULE | Freq: Four times a day (QID) | ORAL | Status: DC

## 2014-01-19 NOTE — ED Provider Notes (Signed)
Medical screening examination/treatment/procedure(s) were conducted as a shared visit with non-physician practitioner(s) and myself.  I personally evaluated the patient during the encounter.  Healing posterior cervical neck wound. There is some surrounding erythema without warmth, fluctuance or drainage. We will extend her Keflex coverage and her follow up with her surgeon.    Wynetta Fines, MD 01/19/14 [phone]

## 2014-01-19 NOTE — Discharge Instructions (Signed)
Please call your doctor for a followup appointment within 24-48 hours. When you talk to your doctor please let them know that you were seen in the emergency department and have them acquire all of your records so that they can discuss the findings with you and formulate a treatment plan to fully care for your new and ongoing problems. Please call and set-up an appointment with your primary care provider to be seen and re-assessed within the next 24-48 hours Please keep appointment with Dr. Danelle Berry on Thursday, 01/21/2014 Please continue to take Keflex as prescribed Please avoid any physical or strenuous activity Please continue to monitor symptoms closely and if symptoms are to worsen or change (fever greater than 101, chills, sweating, nausea, vomiting, chest pain, shortness of breath, difficulty breathing, swelling to the neck, neck pain, neck stiffness, drainage, bleeding, wound comes open) please report back to the ED immediately   Staple Wound Closure Staples are used to help a wound heal faster by holding the edges of the wound together. HOME CARE  Keep the area around the staples clean and dry.  Rest and raise (elevate) the injured part above the level of your heart.  See your doctor for a follow-up check of the wound.  See your doctor to have the staples removed.  Clean the wound daily with water.  Do not soak the wound in water for long periods of time.  Let air reach the wound as it heals. GET HELP RIGHT AWAY IF:   You have redness or puffiness around the wound.  You have a red line going away from the wound.  You have more pain or tenderness.  You have yellowish-white fluid (pus) coming from the wound.  Your wound does not stay together after the staples have been taken out.  You see something coming out of the wound, such as wood or glass.  You have problems moving the injured area.  You have a fever or lasting symptoms for more than 2-3 days.  You have a fever  and your symptoms suddenly get worse. MAKE SURE YOU:   Understand these instructions.  Will watch this condition.  Will get help right away if you are not doing well or get worse. Document Released: 06/12/2008 Document Revised: 05/28/2012 Document Reviewed: 03/16/2012 [facility] Patient Information 2014 [place].
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
## 2014-02-16 ENCOUNTER — Other Ambulatory Visit: Payer: Self-pay | Admitting: Obstetrics & Gynecology

## 2014-02-16 DIAGNOSIS — R928 Other abnormal and inconclusive findings on diagnostic imaging of breast: Secondary | ICD-10-CM

## 2014-02-25 ENCOUNTER — Encounter (INDEPENDENT_AMBULATORY_CARE_PROVIDER_SITE_OTHER): Payer: Self-pay

## 2014-02-25 ENCOUNTER — Ambulatory Visit
Admission: RE | Admit: 2014-02-25 | Discharge: 2014-02-25 | Disposition: A | Discharge status: Home / Self Care | Payer: PRIVATE HEALTH INSURANCE | Source: Ambulatory Visit | Attending: Obstetrics & Gynecology | Admitting: Obstetrics & Gynecology

## 2014-02-25 ENCOUNTER — Other Ambulatory Visit: Payer: Self-pay | Admitting: Obstetrics & Gynecology

## 2014-02-25 DIAGNOSIS — R928 Other abnormal and inconclusive findings on diagnostic imaging of breast: Secondary | ICD-10-CM

## 2014-02-25 DIAGNOSIS — R921 Mammographic calcification found on diagnostic imaging of breast: Secondary | ICD-10-CM

## 2014-03-08 ENCOUNTER — Ambulatory Visit
Admission: RE | Admit: 2014-03-08 | Discharge: 2014-03-08 | Disposition: A | Discharge status: Home / Self Care | Payer: PRIVATE HEALTH INSURANCE | Source: Ambulatory Visit | Attending: Obstetrics & Gynecology | Admitting: Obstetrics & Gynecology

## 2014-03-08 ENCOUNTER — Encounter (INDEPENDENT_AMBULATORY_CARE_PROVIDER_SITE_OTHER): Payer: Self-pay

## 2014-03-08 DIAGNOSIS — R921 Mammographic calcification found on diagnostic imaging of breast: Secondary | ICD-10-CM

## 2014-03-09 ENCOUNTER — Other Ambulatory Visit: Payer: Self-pay | Admitting: Obstetrics & Gynecology

## 2014-03-09 ENCOUNTER — Ambulatory Visit
Admission: RE | Admit: 2014-03-09 | Discharge: 2014-03-09 | Disposition: A | Discharge status: Home / Self Care | Payer: PRIVATE HEALTH INSURANCE | Source: Ambulatory Visit | Attending: Obstetrics & Gynecology | Admitting: Obstetrics & Gynecology

## 2014-03-09 DIAGNOSIS — R921 Mammographic calcification found on diagnostic imaging of breast: Secondary | ICD-10-CM

## 2014-04-23 DIAGNOSIS — F429 Obsessive-compulsive disorder, unspecified: Secondary | ICD-10-CM | POA: Insufficient documentation

## 2014-04-23 DIAGNOSIS — N393 Stress incontinence (female) (male): Secondary | ICD-10-CM | POA: Insufficient documentation

## 2014-05-14 DIAGNOSIS — G8918 Other acute postprocedural pain: Secondary | ICD-10-CM | POA: Insufficient documentation

## 2014-06-06 ENCOUNTER — Encounter (HOSPITAL_COMMUNITY): Payer: Self-pay | Admitting: Emergency Medicine

## 2014-06-06 ENCOUNTER — Emergency Department (INDEPENDENT_AMBULATORY_CARE_PROVIDER_SITE_OTHER)
Admission: EM | Admit: 2014-06-06 | Discharge: 2014-06-06 | Disposition: A | Discharge status: Home / Self Care | Payer: PRIVATE HEALTH INSURANCE | Source: Home / Self Care | Attending: Family Medicine | Admitting: Family Medicine

## 2014-06-06 DIAGNOSIS — IMO0001 Reserved for inherently not codable concepts without codable children: Secondary | ICD-10-CM

## 2014-06-06 DIAGNOSIS — R6889 Other general symptoms and signs: Secondary | ICD-10-CM

## 2014-06-06 DIAGNOSIS — M791 Myalgia, unspecified site: Secondary | ICD-10-CM

## 2014-06-06 LAB — POCT I-STAT, CHEM 8
BUN: 14 mg/dL (ref 6–23)
Calcium, Ion: 1.19 mmol/L (ref 1.12–1.23)
Chloride: 103 mEq/L (ref 96–112)
Creatinine, Ser: 1 mg/dL (ref 0.50–1.10)
Glucose, Bld: 115 mg/dL — ABNORMAL HIGH (ref 70–99)
HEMATOCRIT: 42 % (ref 36.0–46.0)
Hemoglobin: 14.3 g/dL (ref 12.0–15.0)
POTASSIUM: 3.9 meq/L (ref 3.7–5.3)
SODIUM: 138 meq/L (ref 137–147)
TCO2: 26 mmol/L (ref 0–100)

## 2014-06-06 LAB — CK: CK TOTAL: 59 U/L (ref 7–177)

## 2014-06-06 NOTE — ED Notes (Signed)
Patient complains of feeling achy and dizzy for the past three days

## 2014-06-06 NOTE — ED Provider Notes (Signed)
[name] is a 43 y.o. female who presents to Urgent Care today for body aches. Patient has a three-day history is significant body aches. She developed mild dizziness with standing today. She notes that she recently started taking Lamictal about a week ago. She denies any rash fevers or chills nausea vomiting or diarrhea. She has tried some over-the-counter medications which helps some.   Past Medical History  Diagnosis Date  . Depression   . Thyroid disease   . Anxiety   . Obsessive-compulsive disorder   . Fatigue   . Asthma   . Sleep apnea   . COPD (chronic obstructive pulmonary disease)   . Collapsed lung     right lung  . Migraine   . Fibromyalgia   . Bell's palsy   . Dislocated shoulder   . 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 Smoking status: Former Smoker    Quit date: 06/25/2000  . Smokeless tobacco: Never Used  . Alcohol Use: No   ROS as above Medications: No current facility-administered medications for this encounter.   Current Outpatient Prescriptions  Medication Sig Dispense Refill  . amphetamine-dextroamphetamine (ADDERALL) 20 MG tablet Take 20-40 mg by mouth 2 (two) times daily. Take 2 tablets in the morning Take 1 tablet at noon      . Armodafinil (NUVIGIL) 250 MG tablet Take 250 mg by mouth daily.      Marland Kitchen B-Complex TABS Take 1 tablet by mouth daily.      . benztropine (COGENTIN) 2 MG tablet Take 2 mg by mouth daily.      . Biotin 5000 MCG TABS Take 1 tablet by mouth daily.      . budesonide-formoterol (SYMBICORT) 80-4.5 MCG/ACT inhaler Inhale 2 puffs into the lungs 2 (two) times daily.      . busPIRone (BUSPAR) 10 MG tablet Take 10 mg by mouth 4 (four) times daily.       . calcium-vitamin D (OSCAL WITH D) 500-200 MG-UNIT per tablet Take 1 tablet by mouth daily with breakfast.      . cephALEXin (KEFLEX) 250 MG capsule Take 1 capsule (250 mg total) by mouth 4 (four) times daily.  28 capsule  0  . cephALEXin (KEFLEX) 500  MG capsule Take 500 mg by mouth 4 (four) times daily. For 10 days. Started on 01-07-14      . cetirizine (ZYRTEC) 10 MG tablet Take 10 mg by mouth daily.      . clorazepate (TRANXENE) 15 MG tablet Take 15 mg by mouth 2 (two) times daily.       Marland Kitchen CRANBERRY EXTRACT PO Take 2 tablets by mouth 3 (three) times daily with meals.      Marland Kitchen escitalopram (LEXAPRO) 20 MG tablet Take 40 mg by mouth every morning.       . fenofibrate (TRICOR) 48 MG tablet Take 48 mg by mouth daily. Taken at bedtime      . fluticasone (FLONASE) 50 MCG/ACT nasal spray Place 2 sprays into the nose daily.      Marland Kitchen gabapentin (NEURONTIN) 400 MG capsule Take 400 mg by mouth 4 (four) times daily.      . hydrochlorothiazide (HYDRODIURIL) 25 MG tablet Take 25 mg by mouth daily.      Marland Kitchen levothyroxine (SYNTHROID, LEVOTHROID) 50 MCG tablet Take 50 mcg by mouth daily.      Marland Kitchen loperamide (IMODIUM) 2 MG capsule Take 2 mg by mouth daily as needed for diarrhea  or loose stools. For diarrhea      . Lurasidone HCl (LATUDA) 60 MG TABS Take 60 mg by mouth 2 (two) times daily.      . metaxalone (SKELAXIN) 800 MG tablet Take 800 mg by mouth 3 (three) times daily.      . Multiple Vitamin (MULTIVITAMIN WITH MINERALS) TABS tablet Take 1 tablet by mouth daily.      Marland Kitchen oxycodone (OXY-IR) 5 MG capsule Take 5 mg by mouth every 4 (four) hours as needed for pain.      . pravastatin (PRAVACHOL) 20 MG tablet Take 20 mg by mouth at bedtime.       . propranolol ER (INDERAL LA) 60 MG 24 hr capsule Take 60 mg by mouth at bedtime.      . ranitidine (ZANTAC) 150 MG tablet Take 150 mg by mouth 2 (two) times daily.      . valACYclovir (VALTREX) 500 MG tablet Take 500 mg by mouth daily.      . vitamin E 100 UNIT capsule Take 100 Units by mouth daily.        Exam:  BP 119/81  Pulse 86  Temp(Src) 98.5 F (36.9 C) (Oral)  Resp 20  SpO2 97% Gen: Well NAD HEENT: EOMI,  MMM right facial paralysis present. Normal posterior pharynx and tympanic membranes Lungs: Normal work  of breathing. CTABL Heart: RRR no MRG Abd: NABS, Soft. Nondistended, Nontender Exts: Brisk capillary refill, warm and well perfused.   Results for orders placed during the hospital encounter of 06/06/14 (from the past 24 hour(s))  POCT I-STAT, CHEM 8     Status: Abnormal   Collection Time    06/06/14 11:53 AM      Result Value Ref Range   Sodium 138  137 - 147 mEq/L   Potassium 3.9  3.7 - 5.3 mEq/L   Chloride 103  96 - 112 mEq/L   BUN 14  6 - 23 mg/dL   Creatinine, Ser 1.00  0.50 - 1.10 mg/dL   Glucose, Bld 115 (*) 70 - 99 mg/dL   Calcium, Ion 1.19  1.12 - 1.23 mmol/L   TCO2 26  0 - 100 mmol/L   Hemoglobin 14.3  12.0 - 15.0 g/dL   HCT 42.0  36.0 - 46.0 %   No results found.  Assessment and Plan: 43 y.o. female with flulike illness or myalgia. Unclear etiology. Plan for watchful waiting and Tylenol or ibuprofen for symptoms. CK pending.  Discussed warning signs or symptoms. Please see discharge instructions. Patient expresses understanding.     Gregor Hams, MD 06/06/14 [phone]

## 2014-06-06 NOTE — Discharge Instructions (Signed)
Thank you for coming in today. Take Tylenol or ibuprofen for pain. Use your existing Percocet as needed. Come back as needed. I will call if labs are abnormal. Call or go to the emergency room if you get worse, have trouble breathing, have chest pains, or palpitations.   Influenza Influenza ("the flu") is a viral infection of the respiratory tract. It occurs more often in winter months because people spend more time in close contact with one another. Influenza can make you feel very sick. Influenza easily spreads from person to person (contagious). CAUSES  Influenza is caused by a virus that infects the respiratory tract. You can catch the virus by breathing in droplets from an infected person's cough or sneeze. You can also catch the virus by touching something that was recently contaminated with the virus and then touching your mouth, nose, or eyes. RISKS AND COMPLICATIONS You may be at risk for a more severe case of influenza if you smoke cigarettes, have diabetes, have chronic heart disease (such as heart failure) or lung disease (such as asthma), or if you have a weakened immune system. Elderly people and pregnant women are also at risk for more serious infections. The most common problem of influenza is a lung infection (pneumonia). Sometimes, this problem can require emergency medical care and may be life threatening. SIGNS AND SYMPTOMS  Symptoms typically last 4 to 10 days and may include:  Fever.  Chills.  Headache, body aches, and muscle aches.  Sore throat.  Chest discomfort and cough.  Poor appetite.  Weakness or feeling tired.  Dizziness.  Nausea or vomiting. DIAGNOSIS  Diagnosis of influenza is often made based on your history and a physical exam. A nose or throat swab test can be done to confirm the diagnosis. TREATMENT  In mild cases, influenza goes away on its own. Treatment is directed at relieving symptoms. For more severe cases, your health care provider may  prescribe antiviral medicines to shorten the sickness. Antibiotic medicines are not effective because the infection is caused by a virus, not by bacteria. HOME CARE INSTRUCTIONS  Take medicines only as directed by your health care provider.  Use a cool mist humidifier to make breathing easier.  Get plenty of rest until your temperature returns to normal. This usually takes 3 to 4 days.  Drink enough fluid to keep your urine clear or pale yellow.  Cover yourmouth and nosewhen coughing or sneezing,and wash your handswellto prevent thevirusfrom spreading.  Stay homefromwork orschool untilthe fever is gonefor at least 24full day. PREVENTION  An annual influenza vaccination (flu shot) is the best way to avoid getting influenza. An annual flu shot is now routinely recommended for all adults in the [place] IF:  You experiencechest pain, yourcough worsens,or you producemore mucus.  Youhave nausea,vomiting, ordiarrhea.  Your fever returns or gets worse. SEEK IMMEDIATE MEDICAL CARE IF:  You havetrouble breathing, you become short of breath,or your skin ornails becomebluish.  You have severe painor stiffnessin the neck.  You develop a sudden headache, or pain in the face or ear.  You have nausea or vomiting that you cannot control. MAKE SURE YOU:   Understand these instructions.  Will watch your condition.  Will get help right away if you are not doing well or get worse. Document Released: 08/31/2000 Document Revised: 01/18/2014 Document Reviewed: 12/03/2011 [facility] Patient Information 2015 [place], [place]. This information is not intended to replace advice given to you by your health care provider. Make sure you discuss  any questions you have with your health care provider. ° °

## 2014-06-19 ENCOUNTER — Emergency Department (HOSPITAL_COMMUNITY): Payer: PRIVATE HEALTH INSURANCE

## 2014-06-19 ENCOUNTER — Emergency Department (HOSPITAL_COMMUNITY)
Admission: EM | Admit: 2014-06-19 | Discharge: 2014-06-19 | Disposition: A | Discharge status: Home / Self Care | Payer: PRIVATE HEALTH INSURANCE | Attending: Emergency Medicine | Admitting: Emergency Medicine

## 2014-06-19 ENCOUNTER — Encounter (HOSPITAL_COMMUNITY): Payer: Self-pay | Admitting: Emergency Medicine

## 2014-06-19 DIAGNOSIS — S4992XA Unspecified injury of left shoulder and upper arm, initial encounter: Secondary | ICD-10-CM | POA: Insufficient documentation

## 2014-06-19 DIAGNOSIS — S199XXA Unspecified injury of neck, initial encounter: Secondary | ICD-10-CM | POA: Diagnosis present

## 2014-06-19 DIAGNOSIS — G43909 Migraine, unspecified, not intractable, without status migrainosus: Secondary | ICD-10-CM | POA: Insufficient documentation

## 2014-06-19 DIAGNOSIS — S2020XA Contusion of thorax, unspecified, initial encounter: Secondary | ICD-10-CM | POA: Diagnosis not present

## 2014-06-19 DIAGNOSIS — F329 Major depressive disorder, single episode, unspecified: Secondary | ICD-10-CM | POA: Insufficient documentation

## 2014-06-19 DIAGNOSIS — Y92410 Unspecified street and highway as the place of occurrence of the external cause: Secondary | ICD-10-CM | POA: Insufficient documentation

## 2014-06-19 DIAGNOSIS — Z79899 Other long term (current) drug therapy: Secondary | ICD-10-CM | POA: Insufficient documentation

## 2014-06-19 DIAGNOSIS — F419 Anxiety disorder, unspecified: Secondary | ICD-10-CM | POA: Insufficient documentation

## 2014-06-19 DIAGNOSIS — Z7951 Long term (current) use of inhaled steroids: Secondary | ICD-10-CM | POA: Insufficient documentation

## 2014-06-19 DIAGNOSIS — M797 Fibromyalgia: Secondary | ICD-10-CM | POA: Diagnosis not present

## 2014-06-19 DIAGNOSIS — J449 Chronic obstructive pulmonary disease, unspecified: Secondary | ICD-10-CM | POA: Diagnosis not present

## 2014-06-19 DIAGNOSIS — S299XXA Unspecified injury of thorax, initial encounter: Secondary | ICD-10-CM | POA: Diagnosis not present

## 2014-06-19 DIAGNOSIS — S20219A Contusion of unspecified front wall of thorax, initial encounter: Secondary | ICD-10-CM

## 2014-06-19 DIAGNOSIS — S4991XA Unspecified injury of right shoulder and upper arm, initial encounter: Secondary | ICD-10-CM | POA: Diagnosis not present

## 2014-06-19 DIAGNOSIS — Z87891 Personal history of nicotine dependence: Secondary | ICD-10-CM | POA: Diagnosis not present

## 2014-06-19 DIAGNOSIS — E039 Hypothyroidism, unspecified: Secondary | ICD-10-CM | POA: Diagnosis not present

## 2014-06-19 DIAGNOSIS — S161XXA Strain of muscle, fascia and tendon at neck level, initial encounter: Secondary | ICD-10-CM | POA: Diagnosis not present

## 2014-06-19 DIAGNOSIS — Z8669 Personal history of other diseases of the nervous system and sense organs: Secondary | ICD-10-CM | POA: Insufficient documentation

## 2014-06-19 DIAGNOSIS — Y9389 Activity, other specified: Secondary | ICD-10-CM | POA: Diagnosis not present

## 2014-06-19 MED ORDER — OXYCODONE-ACETAMINOPHEN 5-325 MG PO TABS
2.0000 | ORAL_TABLET | Freq: Once | ORAL | Status: AC
  Administered 2014-06-19: 2 via ORAL
  Filled 2014-06-19: qty 2
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
## 2014-06-19 NOTE — Discharge Instructions (Signed)
It was our pleasure to provide your ER care today - we hope that you feel better.  Rest. Take your pain medication as need. Follow up with primary care doctor in 1 week if symptoms fail to improve/resolve.  Return to ER if worse, new symptoms, trouble breathing, numbness/weakness, other concern.  You were given pain medication in the ER - no driving for the next 4 hours.      Cervical Sprain A cervical sprain is when the tissues (ligaments) that hold the neck bones in place stretch or tear. HOME CARE   Put ice on the injured area.  Put ice in a plastic bag.  Place a towel between your skin and the bag.  Leave the ice on for 15-20 minutes, 3-4 times a day.  You may have been given a collar to wear. This collar keeps your neck from moving while you heal.  Do not take the collar off unless told by your doctor.  If you have long hair, keep it outside of the collar.  Ask your doctor before changing the position of your collar. You may need to change its position over time to make it more comfortable.  If you are allowed to take off the collar for cleaning or bathing, follow your doctor's instructions on how to do it safely.  Keep your collar clean by wiping it with mild soap and water. Dry it completely. If the collar has removable pads, remove them every 1-2 days to hand wash them with soap and water. Allow them to air dry. They should be dry before you wear them in the collar.  Do not drive while wearing the collar.  Only take medicine as told by your doctor.  Keep all doctor visits as told.  Keep all physical therapy visits as told.  Adjust your work station so that you have good posture while you work.  Avoid positions and activities that make your problems worse.  Warm up and stretch before being active. GET HELP IF:  Your pain is not controlled with medicine.  You cannot take less pain medicine over time as planned.  Your activity level does not improve as  expected. GET HELP RIGHT AWAY IF:   You are bleeding.  Your stomach is upset.  You have an allergic reaction to your medicine.  You develop new problems that you cannot explain.  You lose feeling (become numb) or you cannot move any part of your body (paralysis).  You have tingling or weakness in any part of your body.  Your symptoms get worse. Symptoms include:  Pain, soreness, stiffness, puffiness (swelling), or a burning feeling in your neck.  Pain when your neck is touched.  Shoulder or upper back pain.  Limited ability to move your neck.  Headache.  Dizziness.  Your hands or arms feel week, lose feeling, or tingle.  Muscle spasms.  Difficulty swallowing or chewing. MAKE SURE YOU:   Understand these instructions.  Will watch your condition.  Will get help right away if you are not doing well or get worse. Document Released: 02/20/2008 Document Revised: 05/06/2013 Document Reviewed: 03/11/2013 [facility] Patient Information 2015 [place], [place]. This information is not intended to replace advice given to you by your health care provider. Make sure you discuss any questions you have with your health care provider.   Back Pain, Adult Low back pain is very common. About 1 in 5 people have back pain.The cause of low back pain is rarely dangerous. The pain often gets  better over time.About half of people with a sudden onset of back pain feel better in just 2 weeks. About 8 in 10 people feel better by 6 weeks.  CAUSES Some common causes of back pain include:  Strain of the muscles or ligaments supporting the spine.  Wear and tear (degeneration) of the spinal discs.  Arthritis.  Direct injury to the back. DIAGNOSIS Most of the time, the direct cause of low back pain is not known.However, back pain can be treated effectively even when the exact cause of the pain is unknown.Answering your caregiver's questions about your overall health and symptoms is one of the  most accurate ways to make sure the cause of your pain is not dangerous. If your caregiver needs more information, he or she may order lab work or imaging tests (X-rays or MRIs).However, even if imaging tests show changes in your back, this usually does not require surgery. HOME CARE INSTRUCTIONS For many people, back pain returns.Since low back pain is rarely dangerous, it is often a condition that people can learn to [facility] their own.   Remain active. It is stressful on the back to sit or stand in one place. Do not sit, drive, or stand in one place for more than 30 minutes at a time. Take short walks on level surfaces as soon as pain allows.Try to increase the length of time you walk each day.  Do not stay in bed.Resting more than 1 or 2 days can delay your recovery.  Do not avoid exercise or work.Your body is made to move.It is not dangerous to be active, even though your back may hurt.Your back will likely heal faster if you return to being active before your pain is gone.  Pay attention to your body when you bend and lift. Many people have less discomfortwhen lifting if they bend their knees, keep the load close to their bodies,and avoid twisting. Often, the most comfortable positions are those that put less stress on your recovering back.  Find a comfortable position to sleep. Use a firm mattress and lie on your side with your knees slightly bent. If you lie on your back, put a pillow under your knees.  Only take over-the-counter or prescription medicines as directed by your caregiver. Over-the-counter medicines to reduce pain and inflammation are often the most helpful.Your caregiver may prescribe muscle relaxant drugs.These medicines help dull your pain so you can more quickly return to your normal activities and healthy exercise.  Put ice on the injured area.  Put ice in a plastic bag.  Place a towel between your skin and the bag.  Leave the ice on for 15-20 minutes,  03-04 times a day for the first 2 to 3 days. After that, ice and heat may be alternated to reduce pain and spasms.  Ask your caregiver about trying back exercises and gentle massage. This may be of some benefit.  Avoid feeling anxious or stressed.Stress increases muscle tension and can worsen back pain.It is important to recognize when you are anxious or stressed and learn ways to manage it.Exercise is a great option. SEEK MEDICAL CARE IF:  You have pain that is not relieved with rest or medicine.  You have pain that does not improve in 1 week.  You have new symptoms.  You are generally not feeling well. SEEK IMMEDIATE MEDICAL CARE IF:   You have pain that radiates from your back into your legs.  You develop new bowel or bladder control problems.  You  have unusual weakness or numbness in your arms or legs.  You develop nausea or vomiting.  You develop abdominal pain.  You feel faint. Document Released: 09/03/2005 Document Revised: 03/04/2012 Document Reviewed: 01/05/2014 [facility] Patient Information 2015 [place], [place]. This information is not intended to replace advice given to you by your health care provider. Make sure you discuss any questions you have with your health care provider.     Blunt Chest Trauma Blunt chest trauma is an injury caused by a blow to the chest. These chest injuries can be very painful. Blunt chest trauma often results in bruised or broken (fractured) ribs. Most cases of bruised and fractured ribs from blunt chest traumas get better after 1 to 3 weeks of rest and pain medicine. Often, the soft tissue in the chest wall is also injured, causing pain and bruising. Internal organs, such as the heart and lungs, may also be injured. Blunt chest trauma can lead to serious medical problems. This injury requires immediate medical care. CAUSES   Motor vehicle collisions.  Falls.  Physical violence.  Sports injuries. SYMPTOMS   Chest pain. The pain  may be worse when you move or breathe deeply.  Shortness of breath.  Lightheadedness.  Bruising.  Tenderness.  Swelling. DIAGNOSIS  Your caregiver will do a physical exam. X-rays may be taken to look for fractures. However, minor rib fractures may not show up on X-rays until a few days after the injury. If a more serious injury is suspected, further imaging tests may be done. This may include ultrasounds, computed tomography (CT) scans, or magnetic resonance imaging (MRI). TREATMENT  Treatment depends on the severity of your injury. Your caregiver may prescribe pain medicines and deep breathing exercises. HOME CARE INSTRUCTIONS  Limit your activities until you can move around without much pain.  Do not do any strenuous work until your injury is healed.  Put ice on the injured area.  Put ice in a plastic bag.  Place a towel between your skin and the bag.  Leave the ice on for 15-20 minutes, 03-04 times a day.  You may wear a rib belt as directed by your caregiver to reduce pain.  Practice deep breathing as directed by your caregiver to keep your lungs clear.  Only take over-the-counter or prescription medicines for pain, fever, or discomfort as directed by your caregiver. SEEK IMMEDIATE MEDICAL CARE IF:   You have increasing pain or shortness of breath.  You cough up blood.  You have nausea, vomiting, or abdominal pain.  You have a fever.  You feel dizzy, weak, or you faint. MAKE SURE YOU:  Understand these instructions.  Will watch your condition.  Will get help right away if you are not doing well or get worse. Document Released: 10/11/2004 Document Revised: 11/26/2011 Document Reviewed: 06/20/2011 [facility] Patient Information 2015 [place], [place]. This information is not intended to replace advice given to you by your health care provider. Make sure you discuss any questions you have with your health care provider.    Motor Vehicle Collision It is common to  have multiple bruises and sore muscles after a motor vehicle collision (MVC). These tend to feel worse for the first 24 hours. You may have the most stiffness and soreness over the first several hours. You may also feel worse when you wake up the first morning after your collision. After this point, you will usually begin to improve with each day. The speed of improvement often depends on the severity of the  collision, the number of injuries, and the location and nature of these injuries. HOME CARE INSTRUCTIONS  Put ice on the injured area.  Put ice in a plastic bag.  Place a towel between your skin and the bag.  Leave the ice on for 15-20 minutes, 3-4 times a day, or as directed by your health care provider.  Drink enough fluids to keep your urine clear or pale yellow. Do not drink alcohol.  Take a warm shower or bath once or twice a day. This will increase blood flow to sore muscles.  You may return to activities as directed by your caregiver. Be careful when lifting, as this may aggravate neck or back pain.  Only take over-the-counter or prescription medicines for pain, discomfort, or fever as directed by your caregiver. Do not use aspirin. This may increase bruising and bleeding. SEEK IMMEDIATE MEDICAL CARE IF:  You have numbness, tingling, or weakness in the arms or legs.  You develop severe headaches not relieved with medicine.  You have severe neck pain, especially tenderness in the middle of the back of your neck.  You have changes in bowel or bladder control.  There is increasing pain in any area of the body.  You have shortness of breath, light-headedness, dizziness, or fainting.  You have chest pain.  You feel sick to your stomach (nauseous), throw up (vomit), or sweat.  You have increasing abdominal discomfort.  There is blood in your urine, stool, or vomit.  You have pain in your shoulder (shoulder strap areas).  You feel your symptoms are getting worse. MAKE  SURE YOU:  Understand these instructions.  Will watch your condition.  Will get help right away if you are not doing well or get worse. Document Released: 09/03/2005 Document Revised: 01/18/2014 Document Reviewed: 01/31/2011 [facility] Patient Information 2015 [place], [place]. This information is not intended to replace advice given to you by your health care provider. Make sure you discuss any questions you have with your health care provider.

## 2014-06-19 NOTE — ED Notes (Signed)
Pt reports that she is hurting in her back from her bra down. Pt adds that she has a HA also. Pt is A&O and in NAD

## 2014-08-06 ENCOUNTER — Emergency Department (HOSPITAL_COMMUNITY)
Admission: EM | Admit: 2014-08-06 | Discharge: 2014-08-06 | Disposition: A | Discharge status: Home / Self Care | Payer: PRIVATE HEALTH INSURANCE | Attending: Emergency Medicine | Admitting: Emergency Medicine

## 2014-08-06 ENCOUNTER — Other Ambulatory Visit: Payer: Self-pay | Admitting: Obstetrics & Gynecology

## 2014-08-06 ENCOUNTER — Emergency Department (HOSPITAL_COMMUNITY): Payer: PRIVATE HEALTH INSURANCE

## 2014-08-06 ENCOUNTER — Encounter (HOSPITAL_COMMUNITY): Payer: Self-pay | Admitting: *Deleted

## 2014-08-06 DIAGNOSIS — F329 Major depressive disorder, single episode, unspecified: Secondary | ICD-10-CM | POA: Diagnosis not present

## 2014-08-06 DIAGNOSIS — G43909 Migraine, unspecified, not intractable, without status migrainosus: Secondary | ICD-10-CM | POA: Diagnosis not present

## 2014-08-06 DIAGNOSIS — Z1231 Encounter for screening mammogram for malignant neoplasm of breast: Secondary | ICD-10-CM

## 2014-08-06 DIAGNOSIS — R079 Chest pain, unspecified: Secondary | ICD-10-CM | POA: Diagnosis present

## 2014-08-06 DIAGNOSIS — J45901 Unspecified asthma with (acute) exacerbation: Secondary | ICD-10-CM | POA: Insufficient documentation

## 2014-08-06 DIAGNOSIS — R922 Inconclusive mammogram: Secondary | ICD-10-CM

## 2014-08-06 DIAGNOSIS — F419 Anxiety disorder, unspecified: Secondary | ICD-10-CM | POA: Diagnosis not present

## 2014-08-06 DIAGNOSIS — R0789 Other chest pain: Secondary | ICD-10-CM | POA: Diagnosis not present

## 2014-08-06 DIAGNOSIS — F42 Obsessive-compulsive disorder: Secondary | ICD-10-CM | POA: Insufficient documentation

## 2014-08-06 DIAGNOSIS — Z79899 Other long term (current) drug therapy: Secondary | ICD-10-CM | POA: Diagnosis not present

## 2014-08-06 DIAGNOSIS — Z87891 Personal history of nicotine dependence: Secondary | ICD-10-CM | POA: Insufficient documentation

## 2014-08-06 DIAGNOSIS — Z87828 Personal history of other (healed) physical injury and trauma: Secondary | ICD-10-CM | POA: Diagnosis not present

## 2014-08-06 DIAGNOSIS — M797 Fibromyalgia: Secondary | ICD-10-CM | POA: Insufficient documentation

## 2014-08-06 DIAGNOSIS — G4730 Sleep apnea, unspecified: Secondary | ICD-10-CM | POA: Diagnosis not present

## 2014-08-06 DIAGNOSIS — R921 Mammographic calcification found on diagnostic imaging of breast: Secondary | ICD-10-CM

## 2014-08-06 LAB — BASIC METABOLIC PANEL
ANION GAP: 14 (ref 5–15)
BUN: 9 mg/dL (ref 6–23)
CHLORIDE: 100 meq/L (ref 96–112)
CO2: 24 mEq/L (ref 19–32)
CREATININE: 0.84 mg/dL (ref 0.50–1.10)
Calcium: 9.6 mg/dL (ref 8.4–10.5)
GFR calc Af Amer: >90 mL/min (ref >90)
GFR, EST NON AFRICAN AMERICAN: 85 mL/min — AB (ref >90)
Glucose, Bld: 107 mg/dL — ABNORMAL HIGH (ref 70–99)
POTASSIUM: 3.9 meq/L (ref 3.7–5.3)
Sodium: 138 mEq/L (ref 137–147)

## 2014-08-06 LAB — I-STAT TROPONIN, ED: TROPONIN I, POC: 0 ng/mL (ref 0.00–0.08)

## 2014-08-06 LAB — D-DIMER, QUANTITATIVE (NOT AT ARMC): D DIMER QUANT: 0.48 ug{FEU}/mL (ref 0.00–0.48)

## 2014-08-06 LAB — CBC
HCT: 39.5 % (ref 36.0–46.0)
HEMOGLOBIN: 13.1 g/dL (ref 12.0–15.0)
MCH: 27.8 pg (ref 26.0–34.0)
MCHC: 33.2 g/dL (ref 30.0–36.0)
MCV: 83.9 fL (ref 78.0–100.0)
Platelets: 245 10*3/uL (ref 150–400)
RBC: 4.71 MIL/uL (ref 3.87–5.11)
RDW: 13.8 % (ref 11.5–15.5)
WBC: 6.5 10*3/uL (ref 4.0–10.5)

## 2014-08-06 LAB — TROPONIN I: Troponin I: <0.3 ng/mL (ref <0.30)

## 2014-08-06 LAB — PRO B NATRIURETIC PEPTIDE: Pro B Natriuretic peptide (BNP): 52.1 pg/mL (ref 0–125)

## 2014-08-06 MED ORDER — MORPHINE SULFATE 4 MG/ML IJ SOLN
6.0000 mg | Freq: Once | INTRAMUSCULAR | Status: AC
  Administered 2014-08-06: 6 mg via INTRAVENOUS
  Filled 2014-08-06: qty 2

## 2014-08-06 NOTE — ED Notes (Signed)
Pt reports onset this afternoon of upper mid chest pains, sharp and occurs more when moving and taking a deep breath. reports recent cough/cold symptoms, having sob. ekg done at triage, airway intact.

## 2014-08-06 NOTE — Discharge Instructions (Signed)
If you were given medicines take as directed.  If you are on coumadin or contraceptives realize their levels and effectiveness is altered by many different medicines.  If you have any reaction (rash, tongues swelling, other) to the medicines stop taking and see a physician.   Please follow up as directed and return to the ER or see a physician for new or worsening symptoms.  Thank you. Filed Vitals:   08/06/14 1650 08/06/14 1658 08/06/14 1700 08/06/14 1800  BP: 120/79  117/72 116/76  Pulse:  73 77 73  Temp:      TempSrc:      Resp:  22 20 15   SpO2:  97% 96% 96%

## 2014-08-06 NOTE — ED Provider Notes (Signed)
CSN: [id number]     Arrival date & time 08/06/14  1418 History   First MD Initiated Contact with Patient 08/06/14 1643     Chief Complaint  Patient presents with  . Chest Pain  . Shortness of Breath     (Consider location/radiation/quality/duration/timing/severity/associated sxs/prior Treatment) HPI Comments: 43 year old female with fibromyalgia, depression, reflux, sleep apnea, obesity presents with anterior sharp chest pain nonradiating since 1:30 PM today. Patient denies similar history and this is not similar to her fibromyalgia. Patient denies any cardiac or blood clot history, no recent surgeries, no hemoptysis, no leg swelling, no active cancer, worse with a deep breath and movement and palpation. No exertional symptoms or diaphoresis. Symptoms fairly constant. Recent upper respiratory infection  Patient is a 43 y.o. female presenting with chest pain and shortness of breath. The history is provided by the patient.  Chest Pain Associated symptoms: shortness of breath   Associated symptoms: no abdominal pain, no back pain, no fever, no headache and not vomiting   Shortness of Breath Associated symptoms: chest pain   Associated symptoms: no abdominal pain, no fever, no headaches, no neck pain, no rash and no vomiting     Past Medical History  Diagnosis Date  . Depression   . Thyroid disease   . Anxiety   . Obsessive-compulsive disorder   . Fatigue   . Asthma   . Sleep apnea   . COPD (chronic obstructive pulmonary disease)   . Collapsed lung     right lung  . Migraine   . Fibromyalgia   . Bell's palsy   . Dislocated shoulder   . 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 Abdominal hysterectomy    . Cesarean section    . Ans unit     . Gallbladder removed    . D[facility]    . Gallbladder surgery      1998  . Ans      2001, 2010  . Cesarean section      2007  . Dilation and curettage of uterus      2007   Family History   Problem Relation Age of Onset  . Adopted: Yes  . Depression Mother   . Dementia Mother   . Depression Maternal Grandmother    History  Substance Use Topics  . Smoking status: Former Smoker    Quit date: 06/25/2000  . Smokeless tobacco: Never Used  . Alcohol Use: No   OB History    No data available     Review of Systems  Constitutional: Negative for fever and chills.  HENT: Positive for congestion.   Eyes: Negative for visual disturbance.  Respiratory: Positive for shortness of breath.   Cardiovascular: Positive for chest pain.  Gastrointestinal: Negative for vomiting and abdominal pain.  Genitourinary: Negative for dysuria and flank pain.  Musculoskeletal: Negative for back pain, neck pain and neck stiffness.  Skin: Negative for rash.  Neurological: Negative for light-headedness and headaches.      Allergies  Dilaudid; Ketorolac tromethamine; and Tape  Home Medications   Prior to Admission medications   Medication Sig Start Date End Date Taking? Authorizing Provider  Armodafinil (NUVIGIL) 250 MG tablet Take 250 mg by mouth daily.    Historical Provider, MD  B-Complex TABS Take 1 tablet by mouth daily.    Historical Provider, MD  benztropine (COGENTIN) 2 MG tablet Take 2 mg by mouth daily.    Historical Provider, MD  budesonide-formoterol (SYMBICORT) 80-4.5 MCG/ACT inhaler Inhale 2 puffs into the lungs 2 (two) times daily.    Historical Provider, MD  busPIRone (BUSPAR) 10 MG tablet Take 10 mg by mouth 4 (four) times daily.     Historical Provider, MD  Carboxymethylcellulose Sodium (REFRESH LIQUIGEL OP) Apply 1 drop to eye 3 (three) times daily.    Historical Provider, MD  cetirizine (ZYRTEC) 10 MG tablet Take 10 mg by mouth daily.    Historical Provider, MD  clorazepate (TRANXENE) 15 MG tablet Take 15 mg by mouth 2 (two) times daily.     Historical Provider, MD  CRANBERRY EXTRACT PO Take 2 tablets by mouth 3 (three) times daily with meals.    Historical Provider, MD   dexmethylphenidate (FOCALIN XR) 20 MG 24 hr capsule Take 20 mg by mouth every morning.    Historical Provider, MD  dexmethylphenidate (FOCALIN) 10 MG tablet Take 10 mg by mouth 3 (three) times daily.    Historical Provider, MD  escitalopram (LEXAPRO) 20 MG tablet Take 40 mg by mouth every morning.     Historical Provider, MD  fenofibrate (TRICOR) 48 MG tablet Take 48 mg by mouth daily. Taken at bedtime    Historical Provider, MD  fluticasone (FLONASE) 50 MCG/ACT nasal spray Place 2 sprays into the nose daily.    Historical Provider, MD  gabapentin (NEURONTIN) 400 MG capsule Take 400 mg by mouth 4 (four) times daily.    Historical Provider, MD  hydrochlorothiazide (MICROZIDE) 12.5 MG capsule Take 12.5 mg by mouth daily.    Historical Provider, MD  HYDROcodone-acetaminophen (NORCO/VICODIN) 5-325 MG per tablet Take 1-2 tablets by mouth every 6 (six) hours as needed for moderate pain.    Historical Provider, MD  lamoTRIgine (LAMICTAL) 25 MG tablet Take 25 mg by mouth daily.    Historical Provider, MD  levothyroxine (SYNTHROID, LEVOTHROID) 50 MCG tablet Take 50 mcg by mouth daily.    Historical Provider, MD  Lurasidone HCl (LATUDA) 60 MG TABS Take 60 mg by mouth 2 (two) times daily.    Historical Provider, MD  metaxalone (SKELAXIN) 800 MG tablet Take 800 mg by mouth 3 (three) times daily.    Historical Provider, MD  mirabegron ER (MYRBETRIQ) 50 MG TB24 tablet Take 50 mg by mouth at bedtime.    Historical Provider, MD  Multiple Vitamin (MULTIVITAMIN WITH MINERALS) TABS tablet Take 1 tablet by mouth daily.    Historical Provider, MD  pravastatin (PRAVACHOL) 20 MG tablet Take 20 mg by mouth at bedtime.     Historical Provider, MD  propranolol ER (INDERAL LA) 60 MG 24 hr capsule Take 60 mg by mouth at bedtime.    Historical Provider, MD  ranitidine (ZANTAC) 150 MG tablet Take 150 mg by mouth 2 (two) times daily.    Historical Provider, MD   BP 116/76 mmHg  Pulse 73  Temp(Src) 97.8 F (36.6 C) (Oral)   Resp 15  SpO2 96% Physical Exam  Constitutional: She is oriented to person, place, and time. She appears well-developed and well-nourished.  HENT:  Head: Normocephalic and atraumatic.  Eyes: Conjunctivae are normal. Right eye exhibits no discharge. Left eye exhibits no discharge.  Neck: Normal range of motion. Neck supple. No tracheal deviation present.  Cardiovascular: Normal rate and regular rhythm.   Pulmonary/Chest: Effort normal and breath sounds normal.  Abdominal: Soft. She exhibits no distension. There is no tenderness. There is no guarding.  Musculoskeletal: She exhibits tenderness. She exhibits no edema.  Patient has moderate tenderness. The  sternal on the left and central palpation no rash visualized.  Neurological: She is alert and oriented to person, place, and time.  Skin: Skin is warm. No rash noted.  Psychiatric: She has a normal mood and affect.  Nursing note and vitals reviewed.   ED Course  Procedures (including critical care time) Labs Review Labs Reviewed  BASIC METABOLIC PANEL - Abnormal; Notable for the following:    Glucose, Bld 107 (*)    GFR calc non Af Amer 85 (*)    All other components within normal limits  CBC  PRO B NATRIURETIC PEPTIDE  TROPONIN I  D-DIMER, QUANTITATIVE  I-STAT TROPOININ, ED    Imaging Review Dg Chest 2 View  08/06/2014   CLINICAL DATA:  Chest pain  EXAM: CHEST  2 VIEW  COMPARISON:  07/23/2014  FINDINGS: Heart size and vascularity are normal. Lungs remain clear without infiltrate or effusion. No mass lesion.  Electrical generator pack overlying the right shaft chest with leads extending to the head as well as leads extending down to the lower back in the subcutaneous tissues posteriorly.  IMPRESSION: No active cardiopulmonary disease.   Electronically Signed   By: Franchot Gallo M.D.   On: 08/06/2014 17:25     EKG Interpretation None     EKG reviewed heart rate 97, nonspecific T-wave flattening, no acute ST elevation, QT  normal, sinus. MDM   Final diagnoses:  Atypical chest pain   Patient low risk for blood clot or cardiac disease presents with atypical chest pain worse with palpation movement and a deep breath. Discussed differential including most likely musculoskeletal however with recent upper respiratory may be pleurisy and patient low risk for blood clot d-dimer sent. Pain medicines ordered. Pain improved in the ER, patient very low risk for significant pathology, clinically likely musculoskeletal as delta troponin negative, d-dimer negative. Discussed close follow-up outpatient and return for worsening symptoms. Results and differential diagnosis were discussed with the patient/parent/guardian. Close follow up outpatient was discussed, comfortable with the plan.   Medications  morphine 4 MG/ML injection 6 mg (6 mg Intravenous Given 08/06/14 1748)    Filed Vitals:   08/06/14 1650 08/06/14 1658 08/06/14 1700 08/06/14 1800  BP: 120/79  117/72 116/76  Pulse:  73 77 73  Temp:      TempSrc:      Resp:  22 20 15   SpO2:  97% 96% 96%    Final diagnoses:  Atypical chest pain        Mariea Clonts, MD 08/06/14 1910

## 2014-08-24 ENCOUNTER — Ambulatory Visit
Admission: RE | Admit: 2014-08-24 | Discharge: 2014-08-24 | Disposition: A | Discharge status: Home / Self Care | Payer: PRIVATE HEALTH INSURANCE | Source: Ambulatory Visit | Attending: Obstetrics & Gynecology | Admitting: Obstetrics & Gynecology

## 2014-08-24 DIAGNOSIS — R921 Mammographic calcification found on diagnostic imaging of breast: Secondary | ICD-10-CM

## 2014-08-26 ENCOUNTER — Telehealth: Payer: Self-pay | Admitting: Cardiology

## 2014-08-26 NOTE — Telephone Encounter (Signed)
Crystal is calling in wanting to see if the patient can be seen sooner because she is having SOB and chest pain. Please call  Thanks

## 2014-08-27 ENCOUNTER — Telehealth: Payer: Self-pay | Admitting: Cardiology

## 2014-08-27 NOTE — Telephone Encounter (Signed)
Called patient directly, no answer.

## 2014-08-27 NOTE — Telephone Encounter (Signed)
Received records from [facility] [name], NP) for appointment with Dr Barrett Shell on 10/01/14.  Records given to [facility] (medical records) for Dr  Doug Sou schedule on 10/01/14.  lp

## 2014-08-27 NOTE — Telephone Encounter (Signed)
Called, no answer.

## 2014-09-07 ENCOUNTER — Ambulatory Visit: Payer: PRIVATE HEALTH INSURANCE | Admitting: Neurology

## 2014-09-14 ENCOUNTER — Ambulatory Visit: Payer: PRIVATE HEALTH INSURANCE | Admitting: Neurology

## 2014-09-16 ENCOUNTER — Encounter: Payer: Self-pay | Admitting: Neurology

## 2014-09-23 DIAGNOSIS — E7800 Pure hypercholesterolemia, unspecified: Secondary | ICD-10-CM | POA: Insufficient documentation

## 2014-10-01 ENCOUNTER — Ambulatory Visit: Payer: Self-pay | Admitting: Cardiology

## 2014-10-22 ENCOUNTER — Ambulatory Visit (INDEPENDENT_AMBULATORY_CARE_PROVIDER_SITE_OTHER): Payer: 59 | Admitting: Neurology

## 2014-10-22 ENCOUNTER — Encounter: Payer: Self-pay | Admitting: Neurology

## 2014-10-22 VITALS — BP 99/68 | HR 87 | Resp 16 | Ht 63.75 in | Wt 219.0 lb

## 2014-10-22 DIAGNOSIS — G4733 Obstructive sleep apnea (adult) (pediatric): Secondary | ICD-10-CM | POA: Diagnosis not present

## 2014-10-22 DIAGNOSIS — F3011 Manic episode without psychotic symptoms, mild: Secondary | ICD-10-CM | POA: Diagnosis not present

## 2014-10-22 DIAGNOSIS — Z9989 Dependence on other enabling machines and devices: Principal | ICD-10-CM

## 2014-10-22 NOTE — Patient Instructions (Signed)

## 2014-10-22 NOTE — Progress Notes (Signed)
[name]  Provider:  Larey Seat, M D  Referring Provider: Judson Roch, MD Primary Care Physician:  Judson Roch, MD  Chief Complaint  Patient presents with  . RV cpap    Rm 58, friend    HPI:  [name] is a 44 y.o. female  Is seen here as a referral/ revisit  from Dr. 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   [name]  Provider:   Asencion Partridge Celine Dishman,M.D . Referring Provider: Coletta Memos , urgent care -Primary Care Physician:  Dalbert Batman, PA.   Chief Complaint  Patient presents with  . RV cpap    Rm 11, friend   SISTER  PRESENT durng this visit . 10-22-14    HPI:  [name] is a 45 y.o. female here again  for compliance with her CPAP treatment , therapy for OSA.  The patient was hospitalized from late April to early May  2015 with pancreatitis. She also developed diabetes, and in the meantime her father and mother have medical problems. This patient was originally diagnosed was mild sleep apnea on 07-26-11 and had been titrated to CPAP at 6 cm water. In the lower I believe a full face mask had been tried was for but could not be used because of her facial asymmetry initially,  she switched to a nasal pillow mask. In December 2012 after developing  an upper respiratory infection her compliance became poor- could not  tolerate the CPAP  very well.  This was reflected in her poor compliance data. She was followed by [facility].  Her diagnostic study revealed  an AHI of 19 per hour .  The patient endorsed a  fatigue severity score at 47 points. Her Epworth score was 12 points at the last visit , down from the pretest CPAP score of 16 points.  We have discussed that she was on multiple medications that will cause additionally sleepiness and to contribute to her current degree of sleepiness.  CPAP download was obtained today in office with a residual AHI of 2.8 and fairly increased air  leak the 91st percentile is eating meters per minute the setting of still 6 cm water with old EPR. The average daily use has with has been reduced to 2 hours and 14 minutes compliance is 29% 4 days over 4 hours. The usage in the night is also very fragmented,  indicating that the patient is not tolerating the current setup very well. She wakes up and finds the setup next number and is not sure how long she may have been on CPAP to begin with. She has no trouble falling asleep at night. Bedtimes are as follows : she goes to bed around 10 PM and wakes up around 5 AM.Wakes up spontaneously without an alarm, may have one bathroom break at night.Patient states that she loses the CPAP after her bathroom break,  around 2 AM.  Estimated sleep time at night is about 6 hours. In the morning the patient will take breakfast 6.30 to 7 AM , no caffeine used. Regular lunch and dinner times, no ETOH, caffeine , no tobacco use. She feels neither refreshed nor restored in AM, dry mouth. No morning headaches since she uses Tens unit.  The patient states that she will try to nap in daytime, usually after lunch. And nap will take place in her bedroom in her bed, and lasts about one hour. Prior to her accident  in which she suffered facial injuries she had no history of sleep disorder, hypersomnia, insomnia or fragmented sleep. There has been no family history of sleep disorders. CPAP settting was6 cm water .  Since she was diagnosed with sleep apnea in  07-26-11 , she had not been able to use the FFM  mask.  She switched to a nasal pillow mask, but contracted a virus in December and had accordingly low compliance data. - See report from [place]. [facility] in January 1013 .   She has used CPAP daily- 7 hours nightly but did not bring the machine or chip here for documentation.  She  reports now  an Epworth score of 12 , down  from the pretest score of 16 points.  She had an AHI of 19  in the diagnostic study. Very high FSS score at  47, but claimed she has chronic fatigue.   She had at the time of the PSG taken a daily nap of one to two hours, scheduled and is using CPAP during Naps. She still naps in daytime now. She is on multiple medications that will cause additional sleepiness.   She reported having severe depression and pain, and changed recently from Cymbalta to Lexapro 30 mg daily, keeps taking Abilify and Adderall, and Cogentin with the Abilify.    Plan : The patient is now followed by [facility], and I would like Jonni Sanger or his colleagues to try to set her up with yet another nasal pillow to improve her compliance.  Please note that due to the facial palsy this patient will have a different air leak risk from our regular patient's.   10-22-14: Interval history :  [name] is seen you today for her CPAP compliance visit. She is using CPAP at 6 cm water pressure with a residual AHI of 5.0 this is still slightly elevated.  Her average time of use is 4 hours and 8 minutes but she was only able to use the machine on 73% of the last 90 days and only on 40 % of days for more than 4 hours.  The patient reports that she loses contact with the CPAP mask. She also remarked that her mask seems not to sit tight enough and that there is no adjustment possible at the head gear she received. I would like for her to switch to a dream where mask is [facility] is able to provide that for her. This would also allow her to have the head gear that is more adjustable and did to on top of the head but is less likely to get entangled.  The current compliance is tool ow to satisfy insurance criteria.  She is very distracted and tangential during this visit ,  Not focussed on her health and compliance issues.     Review of Systems: Out of a complete 14 system review, the patient complains of only the following symptoms, and all other reviewed systems are negative.  She still endorses the Epworth sleepiness score at 15 points  and the fatigue severity score at at 57 points both are highly elevated. Given that her AHI is reduced even if not perfectly so I would not expect an AHI of 5.02 correlate with this degree of excessive daytime sleepiness. I doubt that the apnea is to call for the sleepiness but to shortly know she would need to be more compliant with the use. For this reason I have prescribed a different mask and switch her over.  09-03-13 date of systems today is endorsed for a fatigue severity scale of 58 points, at worst sleepiness score of 17 points actually borers and then initially met her. In addition she said that she has often a runny nose she has no trouble swallowing, ringing in ears, is light sensitive, has restless legs, daytime sleepiness, is non-to perform some sleep talking, she also a house injury related orthopedic and pain problems joint pain, back pain,  aching muscles and muscle cramps, hasn't neck pain, tremor. established Right  facial droop, and she feels anxious, nervous, depressed.  Past Medical History  Diagnosis Date  . Depression   . Thyroid disease   . Anxiety   . Obsessive-compulsive disorder   . Fatigue   . Asthma   . Sleep apnea   . COPD (chronic obstructive pulmonary disease)   . Collapsed lung     right lung  . Migraine   . Fibromyalgia   . Bell's palsy   . Dislocated shoulder   . 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 Abdominal hysterectomy    . Cesarean section    . Ans unit     . Gallbladder removed    . Dnc    . Gallbladder surgery      1998  . Ans      2001, 2010  . Cesarean section      2007  . Dilation and curettage of uterus      2007  . Tens unit      04/2014    Current Outpatient Prescriptions  Medication Sig Dispense Refill  . Armodafinil (NUVIGIL) 250 MG tablet Take 250 mg by mouth daily.    Marland Kitchen B-Complex TABS Take 1 tablet by mouth daily.    . benztropine (COGENTIN) 2 MG tablet Take 2 mg by  mouth daily.    . budesonide-formoterol (SYMBICORT) 80-4.5 MCG/ACT inhaler Inhale 2 puffs into the lungs 2 (two) times daily.    . busPIRone (BUSPAR) 10 MG tablet Take 10 mg by mouth 4 (four) times daily.     . Carboxymethylcellulose Sodium (REFRESH LIQUIGEL OP) Apply 1 drop to eye 3 (three) times daily.    . cetirizine (ZYRTEC) 10 MG tablet Take 10 mg by mouth daily.    . clorazepate (TRANXENE) 15 MG tablet Take 15 mg by mouth 2 (two) times daily.     Marland Kitchen CRANBERRY EXTRACT PO Take 2 tablets by mouth 3 (three) times daily with meals.    Marland Kitchen dexmethylphenidate (FOCALIN XR) 20 MG 24 hr capsule Take 20 mg by mouth every morning.    Marland Kitchen dexmethylphenidate (FOCALIN) 10 MG tablet Take 10 mg by mouth 3 (three) times daily.    Marland Kitchen escitalopram (LEXAPRO) 20 MG tablet Take 40 mg by mouth every morning.     . fenofibrate (TRICOR) 48 MG tablet Take 48 mg by mouth daily. Taken at bedtime    . fluticasone (FLONASE) 50 MCG/ACT nasal spray Place 2 sprays into the nose daily.    Marland Kitchen gabapentin (NEURONTIN) 400 MG capsule Take 400 mg by mouth 4 (four) times daily.    . hydrochlorothiazide (MICROZIDE) 12.5 MG capsule Take 12.5 mg by mouth daily.    Marland Kitchen HYDROcodone-acetaminophen (NORCO/VICODIN) 5-325 MG per tablet Take 1-2 tablets by mouth every 6 (six) hours as needed for moderate pain.    Marland Kitchen lamoTRIgine (LAMICTAL) 25 MG tablet Take 25 mg by mouth daily.    Marland Kitchen levothyroxine (SYNTHROID, LEVOTHROID)  50 MCG tablet Take 50 mcg by mouth daily.    . Lurasidone HCl (LATUDA) 60 MG TABS Take 60 mg by mouth 2 (two) times daily.    . metaxalone (SKELAXIN) 800 MG tablet Take 800 mg by mouth 3 (three) times daily.    . mirabegron ER (MYRBETRIQ) 50 MG TB24 tablet Take 50 mg by mouth at bedtime.    . Multiple Vitamin (MULTIVITAMIN WITH MINERALS) TABS tablet Take 1 tablet by mouth daily.    . pravastatin (PRAVACHOL) 20 MG tablet Take 20 mg by mouth at bedtime.     . propranolol ER (INDERAL LA) 60 MG 24 hr capsule Take 60 mg by mouth at  bedtime.    . ranitidine (ZANTAC) 150 MG tablet Take 150 mg by mouth 2 (two) times daily.     No current facility-administered medications for this visit.    Allergies as of 10/22/2014 - Review Complete 10/22/2014  Allergen Reaction Noted  . Dilaudid [hydromorphone hcl] Itching 08/05/2013  . Ketorolac tromethamine Swelling 12/25/2011  . Tape Other (See Comments) 12/25/2011    Vitals: BP 99/68 mmHg  Pulse 87  Ht 5' 3.75" (1.619 m)  Wt 219 lb (99.338 kg)  BMI 37.90 kg/m2 Last Weight:  Wt Readings from Last 1 Encounters:  10/22/14 219 lb (99.338 kg)   Last Height:   Ht Readings from Last 1 Encounters:  10/22/14 5' 3.75" (1.619 m)   Physical Exam: General:   Patient is awake, alert & oriented to person, place & time.  Head:  Normocephalic. No  retrognathia and no TMJ- patient has partial dentures.  Ears, Nose, Throat:  Mallampati 3 Neck: circumference 15 inches  Respiratory:  Lungs clear throughout to auscultation.    Cardiovascular:  No carotid artery bruits.  Heart is regular rate and rhythm with no murmurs.  Skin:  No bruising, no rash.  Neurologic Exam: Mental Status: Alert, oriented, thought content appropriate.  Speech fluent without evidence of aphasia, m ild dysphonia.   Able to follow 3 step commands without difficulty. Cranial Nerves: IDiscs flat bilaterally. Visual fields grossly intact.patosis is not affecting EOM.  Extraocular movements intact.  Pupils reactive bilaterally. Left sided upper lip scar, right  sided facial weakness with ptosis, all 3 branches of the facial muscles are involved. fracture of the skull caused the facial nerve palsy. normal gag bilateral shoulder shrug midline tongue .Motor: 5/5 bilaterally with normal tone and bulk Sensory: Pinprick and light touch intact throughout, bilaterally Cerebellar: Normal finger-to-nose, normal rapid alternating movements and normal heel-to-shin test.  Normal gait and station.  Gait and station: Patient  walks without assistive device. Stance is stable and normal. Romberg testing  Deferred  Deep tendon reflexes: in the  upper and lower extremities are symmetric and intact. Babinski maneuver response isdowngoing.   Assessment:  After physical and neurologic examination, review of laboratory studies, imaging, neurophysiology testing and pre-existing records, assessment is  1) Mrs. Elsasser would truly benefit from being able to use her CPAP machine nightly.  Her main problem seems to be #1 noncompliance due  to falling asleep for now but not weight up and time to put the CPAP on for the night.  We would like to change that by encouraging the patient to use a CPAP during naptimes as well.  2) she feels that the mask is still not optimally fitted and I think she would be better off with a Dream ware mask if this can be prescribed- currently using  P. 10 nasal pillow  in XS and still losing "touch" with the interface.     Plan:  Treatment plan and additional workup : [facility] referral, NEXT RV with NP. If not compliant  No further RV will be scheduled.

## 2014-10-28 ENCOUNTER — Telehealth: Payer: Self-pay | Admitting: *Deleted

## 2014-10-28 NOTE — Telephone Encounter (Signed)
Patient calling wanting stating she has lost her chip. Patient wants it sent to her overnight to her in [place]. Patient states her husband will come pick it up if he has too. Please call patient at [phone]

## 2014-10-28 NOTE — Telephone Encounter (Signed)
Returned call to the patient to advise her that unfortunately we do not carry CPAP supplies.  She is aware that she must contact her dme company, [facility] to request a replacement chip.  Pt was provided the number to contact them.

## 2014-11-18 ENCOUNTER — Ambulatory Visit (INDEPENDENT_AMBULATORY_CARE_PROVIDER_SITE_OTHER): Payer: 59 | Admitting: Cardiology

## 2014-11-18 ENCOUNTER — Encounter: Payer: Self-pay | Admitting: Cardiology

## 2014-11-18 VITALS — BP 122/80 | HR 78 | Ht 63.5 in | Wt 219.5 lb

## 2014-11-18 DIAGNOSIS — E785 Hyperlipidemia, unspecified: Secondary | ICD-10-CM | POA: Diagnosis not present

## 2014-11-18 DIAGNOSIS — R079 Chest pain, unspecified: Secondary | ICD-10-CM

## 2014-11-18 NOTE — Progress Notes (Signed)
[name] Date of Birth: [birth year] Medical Record #[id number]  History of Present Illness: [name] is seen at the request of Dr. Coletta Memos for evaluation of chest pain. She is a 44 yo WF. Her father [name] is a patient of mine. She has a history of dyslipidemia and family history of CAD. She reports that in December she was driving in her car when she got a severe pain in the mid sternal region that radiated to her left side. It was sharp and stabbing. It was not associated with SOB. Pain was worse with deep breathing, cough, and bending over. Her pain did not go away and she went to the ED where evaluation with cardiac enzymes, BNP, Ecg, d-dimer, and CXR were normal. She took Advil every 4 hours for 2 weeks without relief. She says her pain lasted 2.5 months before it went away. It has not recurred. No history of cardiac disease. She does have a history of depression, sleep apnea, and fibromyalgia.    Medication List       This list is accurate as of: 11/18/14  1:35 PM.  Always use your most recent med list.               acyclovir 400 MG tablet  Commonly known as:  ZOVIRAX  TAKE 1 TABLET BY MOUTH DAILY     B-Complex Tabs  Take 1 tablet by mouth daily. With vitacmin C and Vit E 400mg      Biotin 5000 MCG Caps  Take 10,000 mcg by mouth 3 (three) times daily.     budesonide-formoterol 80-4.5 MCG/ACT inhaler  Commonly known as:  SYMBICORT  Inhale 2 puffs into the lungs 2 (two) times daily.     cetirizine 10 MG tablet  Commonly known as:  ZYRTEC  Take 10 mg by mouth daily.     cholecalciferol 1000 UNITS tablet  Commonly known as:  VITAMIN D  Take 1,000 Units by mouth daily.     CRANBERRY EXTRACT PO  Take 2 tablets by mouth 3 (three) times daily with meals.     dexmethylphenidate 10 MG tablet  Commonly known as:  FOCALIN  Take 10 mg by mouth 3 (three) times daily.     dexmethylphenidate 20 MG 24 hr capsule  Commonly known as:  FOCALIN XR  Take 20 mg by mouth  every morning.     diazepam 5 MG tablet  Commonly known as:  VALIUM  Take 2.5 mg by mouth 3 (three) times daily.     docusate sodium 100 MG capsule  Commonly known as:  COLACE  Take by mouth.     fenofibrate 48 MG tablet  Commonly known as:  TRICOR  Take 48 mg by mouth daily. Taken at bedtime     fluticasone 50 MCG/ACT nasal spray  Commonly known as:  FLONASE  Place 2 sprays into the nose daily.     gabapentin 400 MG capsule  Commonly known as:  NEURONTIN  Take 400 mg by mouth 4 (four) times daily.     hydrochlorothiazide 12.5 MG capsule  Commonly known as:  MICROZIDE  Take 12.5 mg by mouth daily.     HYDROcodone-acetaminophen 5-325 MG per tablet  Commonly known as:  NORCO/VICODIN  Take 1 tablet by mouth 3 (three) times daily.     lamoTRIgine 200 MG tablet  Commonly known as:  LAMICTAL  Take 200 mg by mouth daily.     LATUDA 60 MG Tabs  Generic drug:  Lurasidone HCl  Take 60 mg by mouth daily.     levothyroxine 50 MCG tablet  Commonly known as:  SYNTHROID, LEVOTHROID  Take 50 mcg by mouth daily.     multivitamin with minerals Tabs tablet  Take 1 tablet by mouth daily.     NUVIGIL 250 MG tablet  Generic drug:  Armodafinil  Take 250 mg by mouth daily.     oxybutynin 5 MG 24 hr tablet  Commonly known as:  DITROPAN-XL  Take 5 mg by mouth.     pravastatin 20 MG tablet  Commonly known as:  PRAVACHOL  Take 20 mg by mouth at bedtime.     propranolol ER 60 MG 24 hr capsule  Commonly known as:  INDERAL LA  Take 60 mg by mouth at bedtime.     ranitidine 150 MG tablet  Commonly known as:  ZANTAC  Take 150 mg by mouth 2 (two) times daily.     REFRESH LIQUIGEL OP  Apply 1 drop to eye 3 (three) times daily.     venlafaxine 75 MG tablet  Commonly known as:  EFFEXOR  Take 75 mg by mouth.         Allergies  Allergen Reactions  . Dilaudid [Hydromorphone Hcl] Itching  . Ketorolac Tromethamine Swelling  . Tape Other (See Comments)    Blisters - any type of  tape and Band-Aids     Past Medical History  Diagnosis Date  . Depression   . Thyroid disease   . Anxiety   . Obsessive-compulsive disorder   . Fatigue   . Asthma   . Sleep apnea   . COPD (chronic obstructive pulmonary disease)   . Collapsed lung     right lung  . Migraine   . Fibromyalgia   . Bell's palsy   . Dislocated shoulder   . 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 GERD (gastroesophageal reflux disease)   . Dyslipidemia     Past Surgical History  Procedure Laterality Date  . Abdominal hysterectomy    . Cesarean section    . Ans unit     . Gallbladder removed    . Dnc    . Gallbladder surgery      1998  . Ans      2001, 2010  . Cesarean section      2007  . Dilation and curettage of uterus      2007  . Tens unit      04/2014    History   Social History  . Marital Status: Married    Spouse Name: N/A  . Number of Children: 1  . Years of Education: college   Occupational History  . disabled    Social History Main Topics  . Smoking status: Former Smoker    Quit date: 06/25/2000  . Smokeless tobacco: Never Used  . Alcohol Use: No     Comment: 2000  . Drug Use: No  . Sexual Activity: Not on file   Other Topics Concern  . None   Social History Narrative   Caffeine 20oz diet soda daily.    Family History  Problem Relation Age of Onset  . Adopted: Yes  . Depression Mother   . Dementia Mother   . Depression Maternal Grandmother   . Diabetes Father     Review of Systems: As noted in HPI.  All other systems were reviewed and are negative.  Physical Exam: BP 122/80 mmHg  Pulse 78  Ht  5' 3.5" (1.613 m)  Wt 219 lb 8 oz (99.565 kg)  BMI 38.27 kg/m2 Filed Weights   11/18/14 0913  Weight: 219 lb 8 oz (99.565 kg)  GENERAL:  Well appearing, overweight WF in NAD. HEENT:  PERRL, EOMI, sclera are clear. Oropharynx is clear. NECK:  No jugular venous distention, carotid upstroke brisk and symmetric, no bruits, no thyromegaly or  adenopathy LUNGS:  Clear to auscultation bilaterally CHEST:  Positive pain to palpation in the right parasternal region. HEART:  RRR,  PMI not displaced or sustained,S1 and S2 within normal limits, no S3, no S4: no clicks, no rubs, no murmurs ABD:  Obese, Soft, nontender. BS +, no masses or bruits. No hepatomegaly, no splenomegaly EXT:  2 + pulses throughout, no edema, no cyanosis no clubbing SKIN:  Warm and dry.  No rashes NEURO:  Alert and oriented x 3. Cranial nerves II through XII intact. PSYCH:  Cognitively intact    LABORATORY DATA: Labs and Ecg reviewed from ED visit in Dec. 2015.   Assessment / Plan: 1. Atypical chest pain. Her pain is more consistent with musculoskeletal chest pain/costochondritis. She is very concerned about her cardiac risk with family history. I have recommended a routine ETT to evaluate further.  2. Fibromyalgia 3. Sleep apnea 4. Depression.  5. Dyslipidemia.

## 2014-11-26 ENCOUNTER — Other Ambulatory Visit: Payer: Self-pay | Admitting: Orthopedic Surgery

## 2014-11-26 DIAGNOSIS — M25512 Pain in left shoulder: Secondary | ICD-10-CM

## 2014-12-03 ENCOUNTER — Ambulatory Visit
Admission: RE | Admit: 2014-12-03 | Discharge: 2014-12-03 | Disposition: A | Discharge status: Home / Self Care | Payer: PRIVATE HEALTH INSURANCE | Source: Ambulatory Visit | Attending: Orthopedic Surgery | Admitting: Orthopedic Surgery

## 2014-12-03 DIAGNOSIS — M25512 Pain in left shoulder: Secondary | ICD-10-CM

## 2014-12-03 MED ORDER — IOHEXOL 180 MG/ML  SOLN
15.0000 mL | Freq: Once | INTRAMUSCULAR | Status: AC | PRN
  Administered 2014-12-03: 15 mL via INTRA_ARTICULAR

## 2014-12-14 ENCOUNTER — Telehealth (HOSPITAL_COMMUNITY): Payer: Self-pay

## 2014-12-14 NOTE — Telephone Encounter (Signed)
Encounter complete. 

## 2014-12-16 ENCOUNTER — Ambulatory Visit (HOSPITAL_COMMUNITY)
Admission: RE | Admit: 2014-12-16 | Discharge: 2014-12-16 | Disposition: A | Discharge status: Home / Self Care | Payer: 59 | Source: Ambulatory Visit | Attending: Cardiology | Admitting: Cardiology

## 2014-12-16 DIAGNOSIS — R079 Chest pain, unspecified: Secondary | ICD-10-CM

## 2014-12-16 DIAGNOSIS — E785 Hyperlipidemia, unspecified: Secondary | ICD-10-CM | POA: Insufficient documentation

## 2014-12-16 NOTE — Procedures (Signed)
Exercise Treadmill Test  Pre-Exercise Testing Evaluation Rhythm: sinus tachycardia                  Test  Exercise Tolerance Test Ordering MD: Peter Martinique, MD    Unique Test No: 1  Treadmill:  1  Indication for ETT: chest pain - rule out ischemia  Contraindication to ETT: No   Stress Modality: exercise - treadmill  Cardiac Imaging Performed: non   Protocol: standard Bruce - maximal  Max BP:  116/65  Max MPHR (bpm):  179 85% MPR (bpm):  152  MPHR obtained (bpm):  173 % MPHR obtained:  173  Reached 85% MPHR (min:sec):  6:20 Total Exercise Time (min-sec):  7  Workload in METS:  8.6 Borg Scale: 16  Reason ETT Terminated:  SOB and patient request    ST Segment Analysis At Rest: normal ST segments - no evidence of significant ST depression With Exercise: no evidence of significant ST depression  Other Information Arrhythmia:  No Angina during ETT:  absent (0) Quality of ETT:  diagnostic  ETT Interpretation:  normal - no evidence of ischemia by ST analysis  Comments: GXT  Recommendations: Follow up with Dr. Martinique

## 2014-12-21 ENCOUNTER — Ambulatory Visit: Payer: PRIVATE HEALTH INSURANCE | Admitting: Adult Health

## 2014-12-21 ENCOUNTER — Telehealth: Payer: Self-pay | Admitting: Neurology

## 2014-12-21 NOTE — Telephone Encounter (Signed)
Patient is calling as she is having a surgery by Dr. Cleda Mccreedy and needs Dr. Brett Fairy to call [facility] [phone] to approve procedure.  Thanks!

## 2014-12-22 ENCOUNTER — Ambulatory Visit (INDEPENDENT_AMBULATORY_CARE_PROVIDER_SITE_OTHER): Payer: 59 | Admitting: Adult Health

## 2014-12-22 ENCOUNTER — Encounter: Payer: Self-pay | Admitting: Adult Health

## 2014-12-22 VITALS — BP 113/74 | HR 73 | Ht 63.5 in | Wt 216.0 lb

## 2014-12-22 DIAGNOSIS — G4733 Obstructive sleep apnea (adult) (pediatric): Secondary | ICD-10-CM

## 2014-12-22 DIAGNOSIS — Z9989 Dependence on other enabling machines and devices: Principal | ICD-10-CM

## 2014-12-22 NOTE — Telephone Encounter (Signed)
Consulted with Dr. Rexene Alberts- the patient's compliance on CPAP has improved. Recommend that if she is staying overnight then she should bring her CPAP for usage. Otherwise there is no indication that she can't have surgery.  We do not follow the patient for DBS.

## 2014-12-22 NOTE — Patient Instructions (Signed)
Continue using the CPAP with the new mask.  We will see you in 3 months for another download.
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
## 2014-12-22 NOTE — Telephone Encounter (Signed)
I called pt.  She is to have L shoulder surgery (arthoscopic tendon repair?).  She has DBS and uses CPAP.  Dr. Alvester Morin office asking for ok to proceed.  Pt has appt today with MM/NP at 1000.  [place] mall [place] (her 10, 000 steps).

## 2014-12-22 NOTE — Telephone Encounter (Signed)
LMVM for [facility] at Dr. Alvester Morin office.  Pt ok to proceed with surgery.  Improved compliance with CPAP.  Would bring cpap with her if overnight stay.  We do not follow her for DBS.  Kelly to call back if needed.

## 2014-12-24 DIAGNOSIS — E039 Hypothyroidism, unspecified: Secondary | ICD-10-CM | POA: Insufficient documentation

## 2014-12-24 DIAGNOSIS — J302 Other seasonal allergic rhinitis: Secondary | ICD-10-CM | POA: Insufficient documentation

## 2015-02-22 ENCOUNTER — Other Ambulatory Visit: Payer: Self-pay | Admitting: Physician Assistant

## 2015-02-22 DIAGNOSIS — R1310 Dysphagia, unspecified: Secondary | ICD-10-CM

## 2015-02-24 ENCOUNTER — Ambulatory Visit
Admission: RE | Admit: 2015-02-24 | Discharge: 2015-02-24 | Disposition: A | Discharge status: Home / Self Care | Payer: 59 | Source: Ambulatory Visit | Attending: Physician Assistant | Admitting: Physician Assistant

## 2015-02-24 DIAGNOSIS — R1310 Dysphagia, unspecified: Secondary | ICD-10-CM

## 2015-03-08 ENCOUNTER — Encounter: Payer: Self-pay | Admitting: Neurology

## 2015-03-08 ENCOUNTER — Ambulatory Visit (INDEPENDENT_AMBULATORY_CARE_PROVIDER_SITE_OTHER): Payer: 59 | Admitting: Neurology

## 2015-03-08 VITALS — BP 116/77 | HR 77 | Ht 63.0 in | Wt 218.2 lb

## 2015-03-08 DIAGNOSIS — G510 Bell's palsy: Secondary | ICD-10-CM | POA: Diagnosis not present

## 2015-03-08 DIAGNOSIS — R51 Headache: Secondary | ICD-10-CM | POA: Diagnosis not present

## 2015-03-08 DIAGNOSIS — M797 Fibromyalgia: Secondary | ICD-10-CM | POA: Diagnosis not present

## 2015-03-08 DIAGNOSIS — G8929 Other chronic pain: Secondary | ICD-10-CM

## 2015-03-08 DIAGNOSIS — R519 Headache, unspecified: Secondary | ICD-10-CM

## 2015-03-08 HISTORY — DX: Other chronic pain: G89.29

## 2015-03-08 HISTORY — DX: Headache, unspecified: R51.9

## 2015-03-08 MED ORDER — NARATRIPTAN HCL 2.5 MG PO TABS
2.5000 mg | ORAL_TABLET | Freq: Two times a day (BID) | ORAL | Status: DC | PRN
Start: 2015-03-08 — End: 2016-01-11
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
## 2015-03-08 NOTE — Patient Instructions (Signed)

## 2015-03-29 ENCOUNTER — Ambulatory Visit (INDEPENDENT_AMBULATORY_CARE_PROVIDER_SITE_OTHER): Payer: 59 | Admitting: Neurology

## 2015-03-29 ENCOUNTER — Encounter: Payer: Self-pay | Admitting: Neurology

## 2015-03-29 VITALS — BP 102/70 | HR 88 | Resp 20 | Ht 62.99 in | Wt 217.0 lb

## 2015-03-29 DIAGNOSIS — F489 Nonpsychotic mental disorder, unspecified: Secondary | ICD-10-CM | POA: Diagnosis not present

## 2015-03-29 DIAGNOSIS — J441 Chronic obstructive pulmonary disease with (acute) exacerbation: Secondary | ICD-10-CM | POA: Diagnosis not present

## 2015-03-29 DIAGNOSIS — G4733 Obstructive sleep apnea (adult) (pediatric): Secondary | ICD-10-CM | POA: Diagnosis not present

## 2015-03-29 DIAGNOSIS — F5105 Insomnia due to other mental disorder: Secondary | ICD-10-CM

## 2015-03-29 DIAGNOSIS — Z9989 Dependence on other enabling machines and devices: Secondary | ICD-10-CM
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
## 2015-04-29 ENCOUNTER — Ambulatory Visit (HOSPITAL_COMMUNITY)
Admission: RE | Admit: 2015-04-29 | Discharge: 2015-04-29 | Disposition: A | Discharge status: Home / Self Care | Payer: 59 | Source: Ambulatory Visit | Attending: Gastroenterology | Admitting: Gastroenterology

## 2015-04-29 ENCOUNTER — Encounter (HOSPITAL_COMMUNITY)
Admission: RE | Disposition: A | Discharge status: Home / Self Care | Payer: Self-pay | Source: Ambulatory Visit | Attending: Gastroenterology

## 2015-04-29 DIAGNOSIS — R1310 Dysphagia, unspecified: Secondary | ICD-10-CM | POA: Diagnosis not present

## 2015-04-29 HISTORY — PX: ESOPHAGEAL MANOMETRY: SHX5429

## 2015-04-29 SURGERY — MANOMETRY, ESOPHAGUS

## 2015-04-29 MED ORDER — LIDOCAINE VISCOUS 2 % MT SOLN
OROMUCOSAL | Status: AC
  Filled 2015-04-29: qty 15

## 2015-04-29 SURGICAL SUPPLY — 2 items
FACESHIELD LNG OPTICON STERILE (SAFETY) IMPLANT
GLOVE BIO SURGEON STRL SZ8 (GLOVE) ×4 IMPLANT

## 2015-05-02 ENCOUNTER — Encounter (HOSPITAL_COMMUNITY): Payer: Self-pay | Admitting: Gastroenterology

## 2015-06-01 DIAGNOSIS — K224 Dyskinesia of esophagus: Secondary | ICD-10-CM | POA: Insufficient documentation

## 2015-06-02 ENCOUNTER — Telehealth: Payer: Self-pay | Admitting: Neurology

## 2015-06-02 NOTE — Telephone Encounter (Signed)
Called pt to find out who supplied her cpap machine and to find out what pt means by it "not working". No answer, left a message asking her to call the office back. Will forward this message to Robin, sleep lab manager to discuss with patient.

## 2015-06-02 NOTE — Telephone Encounter (Signed)
Robin, sleep Management consultant, spoke to pt and thinks that the cpap machineis broken. Pt claims she got her cpap machine from a friend. I made an appt with the pt per pt request for 10/12 at 11:15. Pt understands to bring cpap machine.  I called [facility] and explained that we think the machine is broken and to please contact pt. Pt used [facility] before but they are no longer around. I spoke to Amy, who asked her manager if this was ok for [facility] to help with this cpap machine, and they said yes.  I advised pt that [facility] would call her as soon as they could to help troubleshoot her machine. Pt verbalized understanding. I asked pt to call us back if anything further is needed.

## 2015-06-02 NOTE — Telephone Encounter (Signed)
Patient called to advise her CPAP machine is not working. She has been at the hospital with Mom who has been very sick and she noticed she wasn't feeling right. She checked her CPAP machine and it's not working. Patient has contacted [facility] who is her CPAP supplier but they are telling her they can't take care of it because they weren't the company that supplied her with this machine initially. Patient feels better with a properly functioning machine and doesn't know what to do since [facility] won't help her with this.

## 2015-06-12 ENCOUNTER — Emergency Department (HOSPITAL_COMMUNITY)
Admission: EM | Admit: 2015-06-12 | Discharge: 2015-06-12 | Disposition: A | Discharge status: Home / Self Care | Payer: 59 | Attending: Emergency Medicine | Admitting: Emergency Medicine

## 2015-06-12 ENCOUNTER — Emergency Department (HOSPITAL_COMMUNITY): Payer: 59

## 2015-06-12 ENCOUNTER — Encounter (HOSPITAL_COMMUNITY): Payer: Self-pay | Admitting: Emergency Medicine

## 2015-06-12 DIAGNOSIS — Z87891 Personal history of nicotine dependence: Secondary | ICD-10-CM | POA: Insufficient documentation

## 2015-06-12 DIAGNOSIS — E785 Hyperlipidemia, unspecified: Secondary | ICD-10-CM | POA: Diagnosis not present

## 2015-06-12 DIAGNOSIS — Z7951 Long term (current) use of inhaled steroids: Secondary | ICD-10-CM | POA: Diagnosis not present

## 2015-06-12 DIAGNOSIS — F419 Anxiety disorder, unspecified: Secondary | ICD-10-CM | POA: Diagnosis not present

## 2015-06-12 DIAGNOSIS — J449 Chronic obstructive pulmonary disease, unspecified: Secondary | ICD-10-CM | POA: Insufficient documentation

## 2015-06-12 DIAGNOSIS — G8929 Other chronic pain: Secondary | ICD-10-CM | POA: Diagnosis not present

## 2015-06-12 DIAGNOSIS — E079 Disorder of thyroid, unspecified: Secondary | ICD-10-CM | POA: Diagnosis not present

## 2015-06-12 DIAGNOSIS — F42 Obsessive-compulsive disorder: Secondary | ICD-10-CM | POA: Diagnosis not present

## 2015-06-12 DIAGNOSIS — F319 Bipolar disorder, unspecified: Secondary | ICD-10-CM | POA: Diagnosis not present

## 2015-06-12 DIAGNOSIS — M549 Dorsalgia, unspecified: Secondary | ICD-10-CM

## 2015-06-12 DIAGNOSIS — M797 Fibromyalgia: Secondary | ICD-10-CM | POA: Insufficient documentation

## 2015-06-12 DIAGNOSIS — Z85828 Personal history of other malignant neoplasm of skin: Secondary | ICD-10-CM | POA: Diagnosis not present

## 2015-06-12 DIAGNOSIS — G43909 Migraine, unspecified, not intractable, without status migrainosus: Secondary | ICD-10-CM | POA: Diagnosis not present

## 2015-06-12 DIAGNOSIS — E780 Pure hypercholesterolemia: Secondary | ICD-10-CM | POA: Insufficient documentation

## 2015-06-12 DIAGNOSIS — K219 Gastro-esophageal reflux disease without esophagitis: Secondary | ICD-10-CM | POA: Insufficient documentation

## 2015-06-12 DIAGNOSIS — Z79899 Other long term (current) drug therapy: Secondary | ICD-10-CM | POA: Insufficient documentation

## 2015-06-12 DIAGNOSIS — M545 Low back pain: Secondary | ICD-10-CM | POA: Diagnosis present

## 2015-06-12 MED ORDER — DIAZEPAM 5 MG PO TABS: 5.0000 mg | ORAL_TABLET | Freq: Two times a day (BID) | ORAL | Status: DC | PRN

## 2015-06-12 NOTE — ED Notes (Signed)
Patient transported to X-ray 

## 2015-06-12 NOTE — Discharge Instructions (Signed)
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
## 2015-06-12 NOTE — ED Notes (Signed)
Discharge instructions, rx x1, and follow up care reviewed with patient. Patient verbalized understanding.

## 2015-06-14 NOTE — Telephone Encounter (Signed)
Patient returned your call.

## 2015-06-14 NOTE — Telephone Encounter (Signed)
Called pt to verify that she wants to cancel her 10/12 appt. Left message asking pt to call back.  Please keep or cancel pt's appt based on what she says when she calls back. Thank you.

## 2015-06-14 NOTE — Telephone Encounter (Signed)
Patient called to let us know she got her cpap machine working and is doing fine on it. She would like to cancel her appoinment in October since this is fixed.  Thanks

## 2015-06-14 NOTE — Telephone Encounter (Signed)
Spoke to pt. She wants to cancel her 10/12 appt. Appt cancelled. Pt advised to call back if there are further questions or concerns.

## 2015-06-17 ENCOUNTER — Emergency Department (HOSPITAL_COMMUNITY)
Admission: EM | Admit: 2015-06-17 | Discharge: 2015-06-17 | Disposition: A | Discharge status: Home / Self Care | Payer: 59 | Attending: Emergency Medicine | Admitting: Emergency Medicine

## 2015-06-17 ENCOUNTER — Encounter (HOSPITAL_COMMUNITY): Payer: Self-pay | Admitting: Emergency Medicine

## 2015-06-17 ENCOUNTER — Emergency Department (HOSPITAL_COMMUNITY): Payer: 59

## 2015-06-17 DIAGNOSIS — S4992XA Unspecified injury of left shoulder and upper arm, initial encounter: Secondary | ICD-10-CM | POA: Insufficient documentation

## 2015-06-17 DIAGNOSIS — S199XXA Unspecified injury of neck, initial encounter: Secondary | ICD-10-CM | POA: Diagnosis not present

## 2015-06-17 DIAGNOSIS — Z87891 Personal history of nicotine dependence: Secondary | ICD-10-CM | POA: Insufficient documentation

## 2015-06-17 DIAGNOSIS — M797 Fibromyalgia: Secondary | ICD-10-CM | POA: Insufficient documentation

## 2015-06-17 DIAGNOSIS — Z85828 Personal history of other malignant neoplasm of skin: Secondary | ICD-10-CM | POA: Insufficient documentation

## 2015-06-17 DIAGNOSIS — F419 Anxiety disorder, unspecified: Secondary | ICD-10-CM | POA: Diagnosis not present

## 2015-06-17 DIAGNOSIS — Z79899 Other long term (current) drug therapy: Secondary | ICD-10-CM | POA: Diagnosis not present

## 2015-06-17 DIAGNOSIS — E079 Disorder of thyroid, unspecified: Secondary | ICD-10-CM | POA: Diagnosis not present

## 2015-06-17 DIAGNOSIS — S3991XA Unspecified injury of abdomen, initial encounter: Secondary | ICD-10-CM | POA: Diagnosis not present

## 2015-06-17 DIAGNOSIS — T148 Other injury of unspecified body region: Secondary | ICD-10-CM | POA: Diagnosis not present

## 2015-06-17 DIAGNOSIS — T07XXXA Unspecified multiple injuries, initial encounter: Secondary | ICD-10-CM

## 2015-06-17 DIAGNOSIS — Y92410 Unspecified street and highway as the place of occurrence of the external cause: Secondary | ICD-10-CM | POA: Diagnosis not present

## 2015-06-17 DIAGNOSIS — S0990XA Unspecified injury of head, initial encounter: Secondary | ICD-10-CM | POA: Diagnosis present

## 2015-06-17 DIAGNOSIS — G8929 Other chronic pain: Secondary | ICD-10-CM | POA: Insufficient documentation

## 2015-06-17 DIAGNOSIS — K219 Gastro-esophageal reflux disease without esophagitis: Secondary | ICD-10-CM | POA: Diagnosis not present

## 2015-06-17 DIAGNOSIS — F329 Major depressive disorder, single episode, unspecified: Secondary | ICD-10-CM | POA: Insufficient documentation

## 2015-06-17 DIAGNOSIS — Y9389 Activity, other specified: Secondary | ICD-10-CM | POA: Diagnosis not present

## 2015-06-17 DIAGNOSIS — Y998 Other external cause status: Secondary | ICD-10-CM | POA: Diagnosis not present

## 2015-06-17 DIAGNOSIS — S29001A Unspecified injury of muscle and tendon of front wall of thorax, initial encounter: Secondary | ICD-10-CM | POA: Diagnosis not present

## 2015-06-17 DIAGNOSIS — J449 Chronic obstructive pulmonary disease, unspecified: Secondary | ICD-10-CM | POA: Diagnosis not present

## 2015-06-17 DIAGNOSIS — S4991XA Unspecified injury of right shoulder and upper arm, initial encounter: Secondary | ICD-10-CM | POA: Insufficient documentation

## 2015-06-17 LAB — CBC WITH DIFFERENTIAL/PLATELET
Basophils Absolute: 0 10*3/uL (ref 0.0–0.1)
Basophils Relative: 0 %
EOS ABS: 0.3 10*3/uL (ref 0.0–0.7)
EOS PCT: 3 %
HCT: 39 % (ref 36.0–46.0)
HEMOGLOBIN: 12.9 g/dL (ref 12.0–15.0)
LYMPHS ABS: 2.3 10*3/uL (ref 0.7–4.0)
Lymphocytes Relative: 25 %
MCH: 28.7 pg (ref 26.0–34.0)
MCHC: 33.1 g/dL (ref 30.0–36.0)
MCV: 86.7 fL (ref 78.0–100.0)
MONOS PCT: 7 %
Monocytes Absolute: 0.7 10*3/uL (ref 0.1–1.0)
NEUTROS PCT: 65 %
Neutro Abs: 5.9 10*3/uL (ref 1.7–7.7)
Platelets: 263 10*3/uL (ref 150–400)
RBC: 4.5 MIL/uL (ref 3.87–5.11)
RDW: 13.5 % (ref 11.5–15.5)
WBC: 9.2 10*3/uL (ref 4.0–10.5)

## 2015-06-17 LAB — COMPREHENSIVE METABOLIC PANEL
ALT: 17 U/L (ref 14–54)
AST: 16 U/L (ref 15–41)
Albumin: 4 g/dL (ref 3.5–5.0)
Alkaline Phosphatase: 59 U/L (ref 38–126)
Anion gap: 11 (ref 5–15)
BUN: 23 mg/dL — AB (ref 6–20)
CHLORIDE: 100 mmol/L — AB (ref 101–111)
CO2: 23 mmol/L (ref 22–32)
CREATININE: 0.83 mg/dL (ref 0.44–1.00)
Calcium: 9.5 mg/dL (ref 8.9–10.3)
GFR calc Af Amer: >60 mL/min (ref >60)
GFR calc non Af Amer: >60 mL/min (ref >60)
Glucose, Bld: 114 mg/dL — ABNORMAL HIGH (ref 65–99)
Potassium: 3.8 mmol/L (ref 3.5–5.1)
SODIUM: 134 mmol/L — AB (ref 135–145)
Total Bilirubin: 0.7 mg/dL (ref 0.3–1.2)
Total Protein: 6.5 g/dL (ref 6.5–8.1)

## 2015-06-17 LAB — URINALYSIS, ROUTINE W REFLEX MICROSCOPIC
Bilirubin Urine: NEGATIVE
GLUCOSE, UA: NEGATIVE mg/dL
HGB URINE DIPSTICK: NEGATIVE
Ketones, ur: NEGATIVE mg/dL
Leukocytes, UA: NEGATIVE
Nitrite: NEGATIVE
PH: 6 (ref 5.0–8.0)
PROTEIN: NEGATIVE mg/dL
Specific Gravity, Urine: 1.015 (ref 1.005–1.030)
Urobilinogen, UA: 0.2 mg/dL (ref 0.0–1.0)

## 2015-06-17 LAB — LIPASE, BLOOD: Lipase: 27 U/L (ref 22–51)

## 2015-06-17 LAB — I-STAT CG4 LACTIC ACID, ED
Lactic Acid, Venous: 1.12 mmol/L (ref 0.5–2.0)
Lactic Acid, Venous: 0.9 mmol/L (ref 0.5–2.0)

## 2015-06-17 MED ORDER — SODIUM CHLORIDE 0.9 % IV BOLUS (SEPSIS)
1000.0000 mL | Freq: Once | INTRAVENOUS | Status: AC
  Administered 2015-06-17: 1000 mL via INTRAVENOUS

## 2015-06-17 MED ORDER — LORAZEPAM 2 MG/ML IJ SOLN
1.0000 mg | Freq: Once | INTRAMUSCULAR | Status: AC
  Administered 2015-06-17: 1 mg via INTRAVENOUS
  Filled 2015-06-17: qty 1

## 2015-06-17 MED ORDER — FENTANYL CITRATE (PF) 100 MCG/2ML IJ SOLN
50.0000 ug | Freq: Once | INTRAMUSCULAR | Status: AC
  Administered 2015-06-17: 50 ug via INTRAVENOUS
  Filled 2015-06-17: qty 2

## 2015-06-17 MED ORDER — HYDROMORPHONE HCL 1 MG/ML IJ SOLN
1.0000 mg | Freq: Once | INTRAMUSCULAR | Status: AC
Start: 2015-06-17 — End: 2015-06-17
  Administered 2015-06-17: 1 mg via INTRAVENOUS
  Filled 2015-06-17: qty 1

## 2015-06-17 MED ORDER — DIPHENHYDRAMINE HCL 50 MG/ML IJ SOLN
25.0000 mg | Freq: Once | INTRAMUSCULAR | Status: AC
  Administered 2015-06-17: 25 mg via INTRAVENOUS
  Filled 2015-06-17: qty 1

## 2015-06-17 MED ORDER — HYDROMORPHONE HCL 1 MG/ML IJ SOLN
1.0000 mg | Freq: Once | INTRAMUSCULAR | Status: AC
  Administered 2015-06-17: 1 mg via INTRAVENOUS
  Filled 2015-06-17: qty 1

## 2015-06-17 NOTE — ED Notes (Signed)
Dr. Wilson Singer allows C-Collar to be removed.
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
## 2015-06-17 NOTE — ED Notes (Signed)
Patient unable to void with use of bedpan and female urinal. Bladder scan revealed 552mL of urine retained. In and out cath performed.

## 2015-06-17 NOTE — ED Notes (Signed)
Clarified with dr. Amedeo Gory, changed lactic to istat lactic.

## 2015-06-17 NOTE — ED Notes (Signed)
Assisted patient with ambulation in the hallway with use of walker. Patient states she typically uses cane at home because "my right leg gets numb". Patient ambulated approximately 124ft with use of walker supervised by this RN with no assistance. Slow but steady gait. Returned to room and patient sitting in chair. Placed in disposable scrubs and clothing given
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
## 2015-06-17 NOTE — ED Notes (Signed)
Updated Dr. Wilson Singer on patient status. Patient not participating in repositioning, requested bladder to be drained, bladder scan revealed urine retained, straight cath returned 635mL. Urine sample collected. Now complaining of headache, other pain reduced. MD acknowledges, no new orders.

## 2015-06-27 ENCOUNTER — Telehealth: Payer: Self-pay | Admitting: Neurology

## 2015-06-27 NOTE — Telephone Encounter (Signed)
Pt called to cancel appt for 10/11 pt stated [place] was at the beach. This made for pt's 3rd  24 can/no show. Pt was advised that we needed to have permission from the RN and physician before she was scheduled. Pt was also advised that she might be dismissed from the practice depending on physician. She expressed understanding

## 2015-06-27 NOTE — Telephone Encounter (Signed)
I can only find 2 can/no show appointments. I called the patient. Appointment scheduled 10/24.

## 2015-06-28 ENCOUNTER — Institutional Professional Consult (permissible substitution): Payer: 59 | Admitting: Neurology

## 2015-06-29 ENCOUNTER — Ambulatory Visit: Payer: Self-pay | Admitting: Neurology

## 2015-07-08 ENCOUNTER — Ambulatory Visit: Payer: Medicare Other | Admitting: Neurology

## 2015-07-11 ENCOUNTER — Ambulatory Visit (INDEPENDENT_AMBULATORY_CARE_PROVIDER_SITE_OTHER): Payer: 59 | Admitting: Neurology

## 2015-07-11 ENCOUNTER — Encounter: Payer: Self-pay | Admitting: Neurology

## 2015-07-11 VITALS — BP 124/79 | HR 101 | Ht 63.0 in | Wt 219.0 lb

## 2015-07-11 DIAGNOSIS — R519 Headache, unspecified: Secondary | ICD-10-CM

## 2015-07-11 DIAGNOSIS — R51 Headache: Secondary | ICD-10-CM

## 2015-07-11 DIAGNOSIS — G8929 Other chronic pain: Secondary | ICD-10-CM
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
## 2015-07-11 NOTE — Patient Instructions (Addendum)

## 2015-07-27 ENCOUNTER — Encounter: Payer: Self-pay | Admitting: Neurology

## 2015-07-27 ENCOUNTER — Ambulatory Visit (INDEPENDENT_AMBULATORY_CARE_PROVIDER_SITE_OTHER): Payer: 59 | Admitting: Neurology

## 2015-07-27 VITALS — BP 130/87 | HR 72 | Ht 63.0 in | Wt 214.0 lb

## 2015-07-27 DIAGNOSIS — R519 Headache, unspecified: Secondary | ICD-10-CM

## 2015-07-27 DIAGNOSIS — M797 Fibromyalgia: Secondary | ICD-10-CM

## 2015-07-27 DIAGNOSIS — R51 Headache: Secondary | ICD-10-CM | POA: Diagnosis not present
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
## 2015-07-27 NOTE — Patient Instructions (Signed)
Headache and Arthritis  If you have arthritis and headaches, it is possible the two problems are related. Some headaches can be caused by arthritis in your neck (cervicogenic headaches).   Pain medicine is another possible link between arthritis and headache. If you take a lot of over-the-counter medicines for arthritis pain, you may develop the type of headache that can happen when you stop taking your over-the-counter pain reliever or lower your dose too quickly (rebound headache).   WHAT TYPES OF ARTHRITIS CAN CAUSE A HEADACHE?  There are two types of arthritis, rheumatoid arthritis and osteoarthritis. Both types of arthritis can cause headaches.   · Rheumatoid arthritis (RA) is an autoimmune disease that causes inflammation of your joints. When you have RA, your body's defense system (immune system) attacks the joints of your body and causes inflammation. This can lead to deformity over time.  · Osteoarthritis (OA) is wear and tear caused by joint use over time. Osteoarthritis is not an inflammatory disease.  Both OA and RA can cause neck pain that is felt in the head. When the pain is felt in a different location than it originates, it is called radiating or referred pain. This pain is usually felt in the back of the head.   HOW ARE HEADACHES AND ARTHRITIS RELATED?  RA can affect any joint in the body, including the joints between the bones of the neck (cervical vertebrae). The neck joints most commonly affected by RA are the top two joints, between the first and second cervical vertebra. Inflammation in these joints may be felt as neck pain and head pain.  OA of the neck may be caused by gradual wear and tear or by a neck injury. Neck vertebrae may develop calcium deposits in the areas where muscle attach. Wear and tear of the vertebra may cause pressure on the nerves that leave the spinal cord. These changes can cause referred pain that may be felt as a headache.  HOW ARE HEADACHES ASSOCIATED WITH ARTHRITIS  DIAGNOSED?  · Your health care provider may diagnose headache caused by RA if you have inflammation of vertebrae in your neck. You may have:  ¨ Blood tests to measure how much inflammation you have.  ¨ Imaging studies of your neck (MRI) to check for inflammation of cervical vertebrae.  · Your health care provider may diagnose headache caused by OA if an X-ray shows:  ¨ Calcium deposits.  ¨ Bone spurs.  ¨ Narrowing of the space between neck vertebrae.  · Your health care provider may diagnose rebound headache if you have a history of using over-the-counter pain relievers frequently.  WHEN SHOULD I SEEK CARE FOR MY HEADACHES?  Call your health care provider if:  · You have more than three headaches per week.  · You take an over-the-counter pain reliever almost every day.  · Your headaches are getting worse and happening more often.  · You have headache with fever.  · You have headache with numbness, weakness, or dizziness.  · You have headache with nausea or vomiting.  WHAT ARE MY TREATMENT OPTIONS?  · If you have headache caused by RA, treatment may include:    Over-the-counter or prescription-strength anti-inflammatory medicines.    Disease-modifying antirheumatic drugs (DMARDs). These medicines slow or stop the progression of RA.  · If you have headache caused by OA, treatment may include:    Over-the-counter pain medicines.    Heat or massage.    Physical therapy.  · If you have rebound headaches:      They will usually go away within several days of stopping the medicine that caused them.    You may be able to gradually reduce the amount of medicines you take to prevent headache.    Ask your health care provider if you can take another type of medicine instead.     This information is not intended to replace advice given to you by your health care provider. Make sure you discuss any questions you have with your health care provider.     Document Released: 11/24/2003 Document Revised: 09/24/2014 Document Reviewed:  12/07/2013  [employer] Interactive Patient Education ©2016 [employer]

## 2015-09-29 ENCOUNTER — Ambulatory Visit (INDEPENDENT_AMBULATORY_CARE_PROVIDER_SITE_OTHER): Payer: 59 | Admitting: Neurology

## 2015-09-29 ENCOUNTER — Encounter: Payer: Self-pay | Admitting: Neurology

## 2015-09-29 VITALS — BP 122/78 | HR 88 | Resp 20 | Ht 62.0 in | Wt 210.0 lb

## 2015-09-29 DIAGNOSIS — Z9989 Dependence on other enabling machines and devices: Principal | ICD-10-CM

## 2015-09-29 DIAGNOSIS — G4733 Obstructive sleep apnea (adult) (pediatric): Secondary | ICD-10-CM | POA: Diagnosis not present

## 2015-09-29 DIAGNOSIS — F489 Nonpsychotic mental disorder, unspecified: Secondary | ICD-10-CM

## 2015-09-29 DIAGNOSIS — F5105 Insomnia due to other mental disorder: Secondary | ICD-10-CM

## 2015-09-29 NOTE — Patient Instructions (Signed)
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
## 2015-09-30 ENCOUNTER — Telehealth: Payer: Self-pay | Admitting: Neurology

## 2015-09-30 NOTE — Telephone Encounter (Signed)
Pt called and is requesting to speak with the nurse about her insurance. Says they would not cover change of her cpap machine. She wants to talk with the nurse so when she calls the insurance company she has more information to go by. Please call and advise [phone]

## 2015-09-30 NOTE — Telephone Encounter (Signed)
Spoke to pt. She says that her insurance will not let her get a new cpap. I advised her that this might be the case if her cpap is less than five years old. Pt thinks that this might be accurate. I advised her to work closely with [facility] and her insurance, and she should hopefully find out when she can have a new cpap.

## 2015-10-04 ENCOUNTER — Other Ambulatory Visit: Payer: Self-pay | Admitting: Obstetrics & Gynecology

## 2015-10-04 DIAGNOSIS — N63 Unspecified lump in unspecified breast: Secondary | ICD-10-CM

## 2015-10-06 ENCOUNTER — Telehealth: Payer: Self-pay

## 2015-10-06 DIAGNOSIS — G4733 Obstructive sleep apnea (adult) (pediatric): Secondary | ICD-10-CM

## 2015-10-06 NOTE — Telephone Encounter (Signed)
Our Rt team got the order to change pt's pressure to 6.5 and her cpap machine will only do increments of 0.2. so 6.4 or 6.6cmof h20.  Can you ask Dr. Keturah Barre for clarification about where we should set pt?  Thanks!   Spoke with Dr. Brett Fairy. She asked for a pressure change to 6.6 cm H2O for her cpap.

## 2015-10-07 ENCOUNTER — Ambulatory Visit
Admission: RE | Admit: 2015-10-07 | Discharge: 2015-10-07 | Disposition: A | Discharge status: Home / Self Care | Payer: 59 | Source: Ambulatory Visit | Attending: Obstetrics & Gynecology | Admitting: Obstetrics & Gynecology

## 2015-10-07 ENCOUNTER — Ambulatory Visit
Admission: RE | Admit: 2015-10-07 | Discharge: 2015-10-07 | Disposition: A | Discharge status: Home / Self Care | Payer: Medicare Other | Source: Ambulatory Visit | Attending: Obstetrics & Gynecology | Admitting: Obstetrics & Gynecology

## 2015-10-07 DIAGNOSIS — N63 Unspecified lump in unspecified breast: Secondary | ICD-10-CM

## 2015-10-09 DIAGNOSIS — K219 Gastro-esophageal reflux disease without esophagitis: Secondary | ICD-10-CM | POA: Insufficient documentation

## 2015-10-09 DIAGNOSIS — E079 Disorder of thyroid, unspecified: Secondary | ICD-10-CM | POA: Diagnosis not present

## 2015-10-09 DIAGNOSIS — M797 Fibromyalgia: Secondary | ICD-10-CM | POA: Insufficient documentation

## 2015-10-09 DIAGNOSIS — R509 Fever, unspecified: Secondary | ICD-10-CM | POA: Insufficient documentation

## 2015-10-09 DIAGNOSIS — Z79899 Other long term (current) drug therapy: Secondary | ICD-10-CM | POA: Diagnosis not present

## 2015-10-09 DIAGNOSIS — J029 Acute pharyngitis, unspecified: Secondary | ICD-10-CM | POA: Diagnosis not present

## 2015-10-09 DIAGNOSIS — F319 Bipolar disorder, unspecified: Secondary | ICD-10-CM | POA: Insufficient documentation

## 2015-10-09 DIAGNOSIS — R1012 Left upper quadrant pain: Secondary | ICD-10-CM | POA: Diagnosis not present

## 2015-10-09 DIAGNOSIS — R1310 Dysphagia, unspecified: Secondary | ICD-10-CM | POA: Insufficient documentation

## 2015-10-09 DIAGNOSIS — G43909 Migraine, unspecified, not intractable, without status migrainosus: Secondary | ICD-10-CM | POA: Insufficient documentation

## 2015-10-09 DIAGNOSIS — E7800 Pure hypercholesterolemia, unspecified: Secondary | ICD-10-CM | POA: Insufficient documentation

## 2015-10-09 DIAGNOSIS — Z87828 Personal history of other (healed) physical injury and trauma: Secondary | ICD-10-CM | POA: Insufficient documentation

## 2015-10-09 DIAGNOSIS — Z7951 Long term (current) use of inhaled steroids: Secondary | ICD-10-CM | POA: Insufficient documentation

## 2015-10-09 DIAGNOSIS — R079 Chest pain, unspecified: Secondary | ICD-10-CM | POA: Insufficient documentation

## 2015-10-09 DIAGNOSIS — E785 Hyperlipidemia, unspecified: Secondary | ICD-10-CM | POA: Diagnosis not present

## 2015-10-09 DIAGNOSIS — Z85828 Personal history of other malignant neoplasm of skin: Secondary | ICD-10-CM | POA: Diagnosis not present

## 2015-10-09 DIAGNOSIS — J449 Chronic obstructive pulmonary disease, unspecified: Secondary | ICD-10-CM | POA: Diagnosis not present

## 2015-10-09 DIAGNOSIS — F419 Anxiety disorder, unspecified: Secondary | ICD-10-CM | POA: Insufficient documentation

## 2015-10-09 DIAGNOSIS — Z87891 Personal history of nicotine dependence: Secondary | ICD-10-CM | POA: Insufficient documentation

## 2015-10-10 ENCOUNTER — Emergency Department (HOSPITAL_COMMUNITY): Payer: 59

## 2015-10-10 ENCOUNTER — Encounter (HOSPITAL_COMMUNITY): Payer: Self-pay | Admitting: Emergency Medicine

## 2015-10-10 ENCOUNTER — Emergency Department (HOSPITAL_COMMUNITY)
Admission: EM | Admit: 2015-10-10 | Discharge: 2015-10-10 | Disposition: A | Discharge status: Home / Self Care | Payer: 59 | Attending: Emergency Medicine | Admitting: Emergency Medicine

## 2015-10-10 DIAGNOSIS — R1310 Dysphagia, unspecified: Secondary | ICD-10-CM | POA: Diagnosis not present

## 2015-10-10 LAB — CBC WITH DIFFERENTIAL/PLATELET
BASOS ABS: 0.1 10*3/uL (ref 0.0–0.1)
Basophils Relative: 1 %
EOS ABS: 0.1 10*3/uL (ref 0.0–0.7)
EOS PCT: 2 %
HCT: 36.2 % (ref 36.0–46.0)
Hemoglobin: 12.4 g/dL (ref 12.0–15.0)
LYMPHS ABS: 2.6 10*3/uL (ref 0.7–4.0)
Lymphocytes Relative: 47 %
MCH: 28.6 pg (ref 26.0–34.0)
MCHC: 34.3 g/dL (ref 30.0–36.0)
MCV: 83.4 fL (ref 78.0–100.0)
MONO ABS: 0.4 10*3/uL (ref 0.1–1.0)
Monocytes Relative: 8 %
NEUTROS PCT: 42 %
Neutro Abs: 2.4 10*3/uL (ref 1.7–7.7)
PLATELETS: 248 10*3/uL (ref 150–400)
RBC: 4.34 MIL/uL (ref 3.87–5.11)
RDW: 12.4 % (ref 11.5–15.5)
WBC: 5.6 10*3/uL (ref 4.0–10.5)

## 2015-10-10 LAB — BASIC METABOLIC PANEL
Anion gap: 11 (ref 5–15)
BUN: 13 mg/dL (ref 6–20)
CALCIUM: 9.2 mg/dL (ref 8.9–10.3)
CHLORIDE: 100 mmol/L — AB (ref 101–111)
CO2: 28 mmol/L (ref 22–32)
CREATININE: 0.87 mg/dL (ref 0.44–1.00)
Glucose, Bld: 114 mg/dL — ABNORMAL HIGH (ref 65–99)
Potassium: 3.5 mmol/L (ref 3.5–5.1)
SODIUM: 139 mmol/L (ref 135–145)

## 2015-10-10 MED ORDER — MORPHINE SULFATE (PF) 4 MG/ML IV SOLN
4.0000 mg | Freq: Once | INTRAVENOUS | Status: AC
  Administered 2015-10-10: 4 mg via INTRAVENOUS
  Filled 2015-10-10: qty 1

## 2015-10-10 MED ORDER — PANTOPRAZOLE SODIUM 40 MG IV SOLR
40.0000 mg | Freq: Once | INTRAVENOUS | Status: AC
  Administered 2015-10-10: 40 mg via INTRAVENOUS
  Filled 2015-10-10: qty 40

## 2015-10-10 MED ORDER — SODIUM CHLORIDE 0.9 % IV SOLN
1000.0000 mL | Freq: Once | INTRAVENOUS | Status: AC
  Administered 2015-10-10: 1000 mL via INTRAVENOUS

## 2015-10-10 MED ORDER — SODIUM CHLORIDE 0.9 % IV SOLN
1000.0000 mL | INTRAVENOUS | Status: DC
  Administered 2015-10-10: 1000 mL via INTRAVENOUS

## 2015-10-10 MED ORDER — GI COCKTAIL ~~LOC~~
30.0000 mL | Freq: Once | ORAL | Status: AC
  Administered 2015-10-10: 30 mL via ORAL
  Filled 2015-10-10: qty 30
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
## 2015-10-10 NOTE — ED Notes (Signed)
Patient here with complaint of substernal chest pain which she believe is related to recent esophageal procedures. Patient presents paperwork from [facility] which shows that patient was recently treated for esophageal stenosis and had a biopsy done of a section of her esophagus. Since that time pain has worsened in her throat. Swallowing is currently very painful. Patient spoke with GI team who performed her procedures and they recommended her take viscous lidocaine; patient reports no improvement with use of medication.

## 2015-10-10 NOTE — Discharge Instructions (Signed)
Talk with your gastroenterologist regarding treatment of your symptoms.

## 2015-10-10 NOTE — ED Notes (Signed)
Pt left with all her belongings and ambulated out of the treatment area.  

## 2015-11-10 ENCOUNTER — Ambulatory Visit (INDEPENDENT_AMBULATORY_CARE_PROVIDER_SITE_OTHER): Payer: 59 | Admitting: Adult Health

## 2015-11-10 ENCOUNTER — Encounter: Payer: Self-pay | Admitting: Adult Health

## 2015-11-10 VITALS — BP 106/76 | HR 86 | Ht 62.0 in | Wt 206.0 lb

## 2015-11-10 DIAGNOSIS — R51 Headache: Secondary | ICD-10-CM

## 2015-11-10 DIAGNOSIS — R519 Headache, unspecified: Secondary | ICD-10-CM

## 2015-11-10 DIAGNOSIS — G8929 Other chronic pain: Secondary | ICD-10-CM

## 2015-11-10 MED ORDER — PROPRANOLOL HCL ER 120 MG PO CP24: 120.0000 mg | ORAL_CAPSULE | Freq: Every day | ORAL | Status: DC

## 2015-11-10 NOTE — Patient Instructions (Addendum)
Increase inderal to inderal LA 120 mg daily  Monitor Blood pressure and heartrate If your symptoms worsen or you develop new symptoms please let us know.
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
## 2015-11-10 NOTE — Progress Notes (Signed)
I have read the note, and I agree with the clinical assessment and plan.  WILLIS,CHARLES KEITH   

## 2015-12-19 ENCOUNTER — Telehealth: Payer: Self-pay | Admitting: Neurology

## 2015-12-19 NOTE — Telephone Encounter (Signed)
I called the patient. She had a bad headache over the weekend. The patient is doing better now, but she had a severe migraine over the weekend with severe photophobia and phonophobia, nausea. The patient is on a multitude of medications, she had just recently run out of the Effexor and came off the medication suddenly several days prior to this headache. The patient may be a good candidate for Botox. If she has a severe headache again, we may try Thorazine. She gets hydrocodone through another doctor, and she has a pain medicine contract with that doctor. She claims that the hydrocodone makes her jittery and she cannot take it in the evening.

## 2015-12-19 NOTE — Telephone Encounter (Signed)
[name] called the office answering service on Friday afternoon stating that she had over a day of severe headaches, and that none of the medications available to her give her any relief. I advised her that we cannot call in narcotics on a week and she may have to present to the emergency room. The patient understood and plans to see urgent care or emergency room care over the weekend. CD

## 2016-01-03 ENCOUNTER — Other Ambulatory Visit (HOSPITAL_COMMUNITY): Payer: Self-pay | Admitting: Psychiatry

## 2016-01-06 ENCOUNTER — Telehealth: Payer: Self-pay | Admitting: Diagnostic Neuroimaging

## 2016-01-06 NOTE — Telephone Encounter (Signed)
Patient called on Wednesday 01/04/16 for severe HA. I called her back immediately on 01/04/16 and advised her to go to urgent care or ER for severe symptoms and immediate relief if needed. Otherwise, asked her to call back to the office the next day for other options with Dr. Jannifer Franklin.   This documentation is late entry done on 01/06/16. Will forward to Dr. Jannifer Franklin.    Penni Bombard, MD [id number], [id number] AM Certified in Neurology, Neurophysiology and Neuroimaging  [facility] [address] [place], [place] [address] [phone]

## 2016-01-09 ENCOUNTER — Ambulatory Visit: Payer: Medicare Other | Admitting: Adult Health

## 2016-01-10 ENCOUNTER — Telehealth: Payer: Self-pay | Admitting: Neurology

## 2016-01-10 NOTE — Telephone Encounter (Signed)
Patient called regarding severe migraine, states "she has had all day and has been going on all week, can't stand it any longer".

## 2016-01-10 NOTE — Telephone Encounter (Signed)
Pt called back. Says that despite taking medications, including Inderal, she still has a migraine. Has not been to urgent care or ER d/t "not having a ride."

## 2016-01-10 NOTE — Telephone Encounter (Signed)
Returned call to pt and appt scheduled w/ Megan NP @ 7:30 a.m.

## 2016-01-10 NOTE — Telephone Encounter (Signed)
Offer appt with Ward Givens or migraine infusion. -VRP

## 2016-01-11 ENCOUNTER — Ambulatory Visit (INDEPENDENT_AMBULATORY_CARE_PROVIDER_SITE_OTHER): Payer: 59 | Admitting: Adult Health

## 2016-01-11 ENCOUNTER — Encounter: Payer: Self-pay | Admitting: Adult Health

## 2016-01-11 ENCOUNTER — Emergency Department (HOSPITAL_COMMUNITY)
Admission: EM | Admit: 2016-01-11 | Discharge: 2016-01-12 | Disposition: A | Discharge status: Home / Self Care | Payer: 59 | Attending: Emergency Medicine | Admitting: Emergency Medicine

## 2016-01-11 ENCOUNTER — Telehealth: Payer: Self-pay | Admitting: Neurology

## 2016-01-11 ENCOUNTER — Encounter (HOSPITAL_COMMUNITY): Payer: Self-pay | Admitting: Emergency Medicine

## 2016-01-11 VITALS — BP 108/67 | HR 90 | Resp 20 | Ht 62.0 in | Wt 209.0 lb

## 2016-01-11 DIAGNOSIS — F319 Bipolar disorder, unspecified: Secondary | ICD-10-CM | POA: Diagnosis not present

## 2016-01-11 DIAGNOSIS — Z85828 Personal history of other malignant neoplasm of skin: Secondary | ICD-10-CM | POA: Diagnosis not present

## 2016-01-11 DIAGNOSIS — Z79899 Other long term (current) drug therapy: Secondary | ICD-10-CM | POA: Insufficient documentation

## 2016-01-11 DIAGNOSIS — G43811 Other migraine, intractable, with status migrainosus: Secondary | ICD-10-CM | POA: Diagnosis not present

## 2016-01-11 DIAGNOSIS — G8929 Other chronic pain: Secondary | ICD-10-CM | POA: Insufficient documentation

## 2016-01-11 DIAGNOSIS — G43011 Migraine without aura, intractable, with status migrainosus: Secondary | ICD-10-CM

## 2016-01-11 DIAGNOSIS — J449 Chronic obstructive pulmonary disease, unspecified: Secondary | ICD-10-CM | POA: Insufficient documentation

## 2016-01-11 DIAGNOSIS — F419 Anxiety disorder, unspecified: Secondary | ICD-10-CM | POA: Insufficient documentation

## 2016-01-11 DIAGNOSIS — R51 Headache: Secondary | ICD-10-CM | POA: Diagnosis present

## 2016-01-11 DIAGNOSIS — Z87891 Personal history of nicotine dependence: Secondary | ICD-10-CM | POA: Diagnosis not present

## 2016-01-11 MED ORDER — DIPHENHYDRAMINE HCL 50 MG/ML IJ SOLN
25.0000 mg | Freq: Once | INTRAMUSCULAR | Status: AC
  Administered 2016-01-12: 25 mg via INTRAVENOUS
  Filled 2016-01-11: qty 1

## 2016-01-11 MED ORDER — ONDANSETRON HCL 4 MG/2ML IJ SOLN
4.0000 mg | Freq: Once | INTRAMUSCULAR | Status: AC
  Administered 2016-01-12: 4 mg via INTRAVENOUS
  Filled 2016-01-11: qty 2

## 2016-01-11 MED ORDER — SUMATRIPTAN SUCCINATE 6 MG/0.5ML ~~LOC~~ SOLN
6.0000 mg | Freq: Once | SUBCUTANEOUS | Status: AC
  Administered 2016-01-11: 6 mg via SUBCUTANEOUS
  Filled 2016-01-11: qty 0.5

## 2016-01-11 MED ORDER — SODIUM CHLORIDE 0.9 % IV BOLUS (SEPSIS)
1000.0000 mL | Freq: Once | INTRAVENOUS | Status: AC
  Administered 2016-01-11: 1000 mL via INTRAVENOUS

## 2016-01-11 MED ORDER — VALPROATE SODIUM 500 MG/5ML IV SOLN
500.0000 mg | Freq: Once | INTRAVENOUS | Status: AC
  Administered 2016-01-11: 500 mg via INTRAVENOUS
  Filled 2016-01-11: qty 5

## 2016-01-11 MED ORDER — DIPHENHYDRAMINE HCL 50 MG/ML IJ SOLN
25.0000 mg | Freq: Once | INTRAMUSCULAR | Status: AC
  Administered 2016-01-11: 25 mg via INTRAVENOUS
  Filled 2016-01-11: qty 1

## 2016-01-11 MED ORDER — METOCLOPRAMIDE HCL 5 MG/ML IJ SOLN
10.0000 mg | Freq: Once | INTRAMUSCULAR | Status: AC
  Administered 2016-01-11: 10 mg via INTRAVENOUS
  Filled 2016-01-11: qty 2

## 2016-01-11 MED ORDER — HYDROMORPHONE HCL 1 MG/ML IJ SOLN
1.0000 mg | Freq: Once | INTRAMUSCULAR | Status: AC
  Administered 2016-01-12: 1 mg via INTRAVENOUS
  Filled 2016-01-11: qty 1
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
## 2016-01-11 NOTE — ED Notes (Signed)
Pt. reports chronic migraine headache since September last year worse today with mild nausea and photophobia unrelieved by prescription medications , [facility] Solumedrol [facility] Depacon [facility] with no relief . Denies fever or chills.

## 2016-01-11 NOTE — Patient Instructions (Signed)
IV infusion- solumedrol and depacon if needed.  Talk with PCP about eliminating some of the medications you are on.  If your symptoms worsen or you develop new symptoms please let us know.

## 2016-01-11 NOTE — Telephone Encounter (Signed)
The patient had a Solu-Medrol infusion 500 mg as well as Depacon 500 mg IV today. She reports is not been beneficial for her headache. I consulted with Dr. Leonie Man the patient is on a multitude of medication. I advised that if her headache is severe she may consider urgent care or the emergency room. The patient verbalized understanding.

## 2016-01-11 NOTE — Discharge Instructions (Signed)
Recurrent Migraine Headache A migraine headache is an intense, throbbing pain on one or both sides of your head. Recurrent migraines keep coming back. A migraine can last for 30 minutes to several hours. CAUSES  The exact cause of a migraine headache is not always known. However, a migraine may be caused when nerves in the brain become irritated and release chemicals that cause inflammation. This causes pain. Certain things may also trigger migraines, such as:   Alcohol.  Smoking.  Stress.  Menstruation.  Aged cheeses.  Foods or drinks that contain nitrates, glutamate, aspartame, or tyramine.  Lack of sleep.  Chocolate.  Caffeine.  Hunger.  Physical exertion.  Fatigue.  Medicines used to treat chest pain (nitroglycerine), birth control pills, estrogen, and some blood pressure medicines. SYMPTOMS   Pain on one or both sides of your head.  Pulsating or throbbing pain.  Severe pain that prevents daily activities.  Pain that is aggravated by any physical activity.  Nausea, vomiting, or both.  Dizziness.  Pain with exposure to bright lights, loud noises, or activity.  General sensitivity to bright lights, loud noises, or smells. Before you get a migraine, you may get warning signs that a migraine is coming (aura). An aura may include:  Seeing flashing lights.  Seeing bright spots, halos, or zigzag lines.  Having tunnel vision or blurred vision.  Having feelings of numbness or tingling.  Having trouble talking.  Having muscle weakness. DIAGNOSIS  A recurrent migraine headache is often diagnosed based on:  Symptoms.  Physical examination.  A CT scan or MRI of your head. These imaging tests cannot diagnose migraines but can help rule out other causes of headaches.  TREATMENT  Medicines may be given for pain and nausea. Medicines can also be given to help prevent recurrent migraines. HOME CARE INSTRUCTIONS  Only take over-the-counter or prescription  medicines for pain or discomfort as directed by your health care provider. The use of long-term narcotics is not recommended.  Lie down in a dark, quiet room when you have a migraine.  Keep a journal to find out what may trigger your migraine headaches. For example, write down:  What you eat and drink.  How much sleep you get.  Any change to your diet or medicines.  Limit alcohol consumption.  Quit smoking if you smoke.  Get 7-9 hours of sleep, or as recommended by your health care provider.  Limit stress.  Keep lights dim if bright lights bother you and make your migraines worse. SEEK MEDICAL CARE IF:   You do not get relief from the medicines given to you.  You have a recurrence of pain.  You have a fever. SEEK IMMEDIATE MEDICAL CARE IF:  Your migraine becomes severe.  You have a stiff neck.  You have loss of vision.  You have muscular weakness or loss of muscle control.  You start losing your balance or have trouble walking.  You feel faint or pass out.  You have severe symptoms that are different from your first symptoms. MAKE SURE YOU:   Understand these instructions.  Will watch your condition.  Will get help right away if you are not doing well or get worse.   This information is not intended to replace advice given to you by your health care provider. Make sure you discuss any questions you have with your health care provider.   Document Released: 05/29/2001 Document Revised: 09/24/2014 Document Reviewed: 05/11/2013 Elsevier Interactive Patient Education 2016 Elsevier Inc.  

## 2016-01-11 NOTE — Telephone Encounter (Signed)
Pt called in after having infusion today still complaining of severe migraine. She said the infusion has not worked. Please call and advise [phone]

## 2016-01-11 NOTE — Telephone Encounter (Signed)
Patient called to check status of message left earlier. States "infusion hasn't worked, hasn't worked at all".

## 2016-01-12 ENCOUNTER — Telehealth: Payer: Self-pay | Admitting: Adult Health

## 2016-01-12 MED ORDER — SODIUM CHLORIDE 0.9 % IV BOLUS (SEPSIS)
1000.0000 mL | Freq: Once | INTRAVENOUS | Status: AC
  Administered 2016-01-12: 1000 mL via INTRAVENOUS

## 2016-01-12 MED ORDER — DEXAMETHASONE SODIUM PHOSPHATE 10 MG/ML IJ SOLN
10.0000 mg | Freq: Once | INTRAMUSCULAR | Status: AC
  Administered 2016-01-12: 10 mg via INTRAVENOUS
  Filled 2016-01-12: qty 1

## 2016-01-12 MED ORDER — PROCHLORPERAZINE EDISYLATE 5 MG/ML IJ SOLN
10.0000 mg | Freq: Once | INTRAMUSCULAR | Status: AC
Start: 2016-01-12 — End: 2016-01-12
  Administered 2016-01-12: 10 mg via INTRAVENOUS
  Filled 2016-01-12: qty 2

## 2016-01-12 NOTE — Telephone Encounter (Signed)
Dr. Brett Fairy,   I saw this patient yesterday. She received IV solumedrol and Depacon. No improvement with her headache. She is on a multitude of medication. She called yesterday afternoon- I consulted with [facility]-Dr. Leonie Man who recommended urgent care or ED. She went to ED but is calling this morning still complaining of a  persistent headache. Do you have any recommendations?

## 2016-01-12 NOTE — Telephone Encounter (Signed)
Dr. Brett Fairy recommended putting a referral in for a headache clinic. Please call to see if the patient is interested in this. Thanks.

## 2016-01-12 NOTE — Telephone Encounter (Addendum)
Pt called sts she went to ED last night. HA is still persistant. She was requesting to make appt with [facility] but her schedule is booked. Please call

## 2016-01-12 NOTE — Telephone Encounter (Signed)
I called pt to let her know that MM/NP sent message to Dr. Brett Fairy for reccs about her migraine treatement.  She was at level 7-8 now. (pain going to her teeth).

## 2016-01-12 NOTE — Telephone Encounter (Signed)
Please send referral to [facility] on my behalf. CDohmeier.

## 2016-01-12 NOTE — Telephone Encounter (Signed)
I spoke to pt and she would like to go to [facility] pain clinic.  She has been there before. (over 3 yrs).  [address]  [place] , [place]  [address].  [phone], [phone] fax.

## 2016-01-12 NOTE — ED Provider Notes (Signed)
3:00 AM  Assumed care from Dr. Jeneen Rinks.  Pt is a 44 y.o. female with history of migraines. Here with recurrent migraine, photophobia, nausea. Received Depakote, Solu-Medrol at her PCPs office today. Has had Dilaudid, Reglan, Benadryl, Depakote with some relief. Plan was to discharge patient. Nursing staff reports me because patient was crying because of her headache. Given Compazine, Decadron and a second liter of IV fluids and now her headache is almost completely gone. She is ready for discharge. Provided with outpatient follow-up. Discussed return precautions. She verbalized understanding is comfortable with this plan. She is neurologically intact. No head injury recently. Not on anticoagulation. No fevers, meningismus. Reports this feels exactly like her prior migraines.  [place], DO 01/12/16 [phone]

## 2016-01-12 NOTE — Telephone Encounter (Signed)
error 

## 2016-01-13 NOTE — Telephone Encounter (Signed)
Placed referral to [facility] pain clinic per Dr Brett Fairy request.

## 2016-01-13 NOTE — Addendum Note (Signed)
Addended by: Hope Pigeon on: 01/13/2016 08:23 AM   Modules accepted: Orders

## 2016-01-16 NOTE — Progress Notes (Signed)
I have reviewed and agreed above plan. 

## 2016-01-22 NOTE — ED Provider Notes (Addendum)
CSN: [id number]     Arrival date & time 01/11/16  1925 History   First MD Initiated Contact with Patient 01/11/16 2139     Chief Complaint  Patient presents with  . Migraine      HPI  Vision presents evaluation of headache. Has history migraine headaches since September 2016. States she has one every day and they're severe. States this would "just isn't going away". Seen at her neurologist office morning given IV Solu-Medrol and Depakote. No fever no chills. No neurological symptoms. No vision changes no difficult to his speech or swallowing. No numbness weakness tingling to extremities. No balance or coordination issues. Fevers no neck pain no atypical features.  Past Medical History  Diagnosis Date  . Depression   . Thyroid disease   . Anxiety   . Obsessive-compulsive disorder   . Fatigue   . Asthma   . Sleep apnea   . COPD (chronic obstructive pulmonary disease) ([place])   . Collapsed lung     right lung  . Migraine   . Fibromyalgia   . Bell's palsy   . Dislocated shoulder   . 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 GERD (gastroesophageal reflux disease)   . Dyslipidemia   . Bipolar disorder ([place])   . OSA (obstructive sleep apnea)   . Hypercholesteremia   . Cancer ([place])     skin  . Chronic headache disorder 03/08/2015  . Nutcracker esophagus    Past Surgical History  Procedure Laterality Date  . Abdominal hysterectomy    . Cesarean section    . Ans unit     . Gallbladder removed    . Dnc    . Gallbladder surgery      1998  . Ans      2001, 2010  . Cesarean section      2007  . Dilation and curettage of uterus      2007  . Tens unit      04/2014  . Stretching of esophagus    . Esophageal manometry N/A 04/29/2015    Procedure: ESOPHAGEAL MANOMETRY (EM);  Surgeon: Arta Silence, MD;  Location: [facility];  Service: Endoscopy;  Laterality: N/A;   Family History  Problem Relation Age of Onset  . Adopted: Yes  . Depression Mother   . Dementia Mother   .  Depression Maternal Grandmother   . Diabetes Father    Social History  Substance Use Topics  . Smoking status: Former Smoker    Quit date: 06/25/2000  . Smokeless tobacco: Never Used  . Alcohol Use: No   OB History    No data available     Review of Systems  Constitutional: Negative for fever, chills, diaphoresis, appetite change and fatigue.  HENT: Negative for mouth sores, sore throat and trouble swallowing.   Eyes: Negative for visual disturbance.  Respiratory: Negative for cough, chest tightness, shortness of breath and wheezing.   Cardiovascular: Negative for chest pain.  Gastrointestinal: Negative for nausea, vomiting, abdominal pain, diarrhea and abdominal distention.  Endocrine: Negative for polydipsia, polyphagia and polyuria.  Genitourinary: Negative for dysuria, frequency and hematuria.  Musculoskeletal: Negative for gait problem.  Skin: Negative for color change, pallor and rash.  Neurological: Positive for headaches. Negative for dizziness, syncope and light-headedness.  Hematological: Does not bruise/bleed easily.  Psychiatric/Behavioral: Negative for behavioral problems and confusion.      Allergies  Dilaudid; Iodinated diagnostic agents; Ketorolac tromethamine; Nsaids; Salicylates; and Tape  Home Medications   Prior to  Admission medications   Medication Sig Start Date End Date Taking? Authorizing Provider  acyclovir (ZOVIRAX) 400 MG tablet TAKE 1 TABLET BY MOUTH DAILY 09/27/14  Yes Historical Provider, MD  albuterol (PROVENTIL HFA;VENTOLIN HFA) 108 (90 BASE) MCG/ACT inhaler Inhale 1 puff into the lungs every 6 (six) hours as needed for wheezing or shortness of breath.   Yes Historical Provider, MD  Armodafinil (NUVIGIL) 250 MG tablet Take 250 mg by mouth daily.   Yes Historical Provider, MD  Biotin 5000 MCG CAPS Take 10,000 mcg by mouth 2 (two) times daily.    Yes Historical Provider, MD  budesonide-formoterol (SYMBICORT) 80-4.5 MCG/ACT inhaler Inhale 2 puffs  into the lungs 2 (two) times daily.   Yes Historical Provider, MD  Carboxymethylcellulose Sodium (REFRESH LIQUIGEL OP) Apply 1 drop to eye every 4 (four) hours as needed (dryness).    Yes Historical Provider, MD  Cariprazine HCl (VRAYLAR) 3 MG CAPS Take 6 mg by mouth at bedtime. 4.5 mg   Yes Historical Provider, MD  cetirizine (ZYRTEC) 10 MG tablet Take 10 mg by mouth daily.   Yes Historical Provider, MD  dexmethylphenidate (FOCALIN XR) 20 MG 24 hr capsule Take 20 mg by mouth every morning.   Yes Historical Provider, MD  dexmethylphenidate (FOCALIN) 10 MG tablet Take 10 mg by mouth 3 (three) times daily.   Yes Historical Provider, MD  diazepam (VALIUM) 2 MG tablet Take 1-4 mg by mouth 3 (three) times daily. Take 1mg  tablet twice daily and 4mg  every night at bedtime.   Yes Historical Provider, MD  fenofibrate (TRICOR) 145 MG tablet Take 145 mg by mouth at bedtime.    Yes Historical Provider, MD  fluticasone (FLONASE) 50 MCG/ACT nasal spray Place 2 sprays into the nose daily as needed for allergies.    Yes Historical Provider, MD  gabapentin (NEURONTIN) 600 MG tablet Take 600 mg by mouth 4 (four) times daily.   Yes Historical Provider, MD  hydrochlorothiazide (MICROZIDE) 12.5 MG capsule Take 12.5 mg by mouth daily.   Yes Historical Provider, MD  HYDROcodone-acetaminophen (NORCO) 7.5-325 MG tablet Take 1 tablet by mouth 3 (three) times daily.    Yes Historical Provider, MD  lamoTRIgine (LAMICTAL) 200 MG tablet Take 200 mg by mouth at bedtime.    Yes Historical Provider, MD  levothyroxine (SYNTHROID, LEVOTHROID) 75 MCG tablet Take 75 mcg by mouth daily before breakfast.   Yes Historical Provider, MD  meloxicam (MOBIC) 15 MG tablet Take 15 mg by mouth daily as needed for pain.    Yes Historical Provider, MD  metaxalone (SKELAXIN) 800 MG tablet Take 800 mg by mouth 3 (three) times daily as needed for muscle spasms.    Yes Historical Provider, MD  Multiple Vitamin (MULTIVITAMIN WITH MINERALS) TABS tablet  Take 1 tablet by mouth daily.   Yes Historical Provider, MD  polyethylene glycol (MIRALAX / GLYCOLAX) packet Take 17 g by mouth daily as needed for mild constipation.    Yes Historical Provider, MD  pravastatin (PRAVACHOL) 20 MG tablet Take 20 mg by mouth at bedtime.    Yes Historical Provider, MD  propranolol ER (INDERAL LA) 120 MG 24 hr capsule Take 1 capsule (120 mg total) by mouth daily. 11/10/15  Yes Ward Givens, NP  senna (SENOKOT) 8.6 MG TABS tablet Take 3 tablets by mouth 2 (two) times daily.    Yes Historical Provider, MD  trospium (SANCTURA) 20 MG tablet Take 20 mg by mouth 2 (two) times daily.   Yes Historical Provider, MD  UNABLE TO FIND Take 1 Applicatorful by mouth 2 (two) times daily. Med Name: Lidocaine- antiacid solution   Yes Historical Provider, MD  urea (CARMOL) 40 % CREA Apply 1 application topically at bedtime.   Yes Historical Provider, MD  venlafaxine XR (EFFEXOR-XR) 150 MG 24 hr capsule Take 300 mg by mouth daily with breakfast.   Yes Historical Provider, MD   BP 104/62 mmHg  Pulse 75  Temp(Src) 98.7 F (37.1 C) (Oral)  Resp 20  Ht 5\' 2"  (1.575 m)  Wt 207 lb 7 oz (94.093 kg)  BMI 37.93 kg/m2  SpO2 95% Physical Exam  Constitutional: She is oriented to person, place, and time. She appears well-developed and well-nourished. No distress.  HENT:  Head: Normocephalic.  Eyes: Conjunctivae are normal. Pupils are equal, round, and reactive to light. No scleral icterus.  Neck: Normal range of motion. Neck supple. No thyromegaly present.  Cardiovascular: Normal rate and regular rhythm.  Exam reveals no gallop and no friction rub.   No murmur heard. Pulmonary/Chest: Effort normal and breath sounds normal. No respiratory distress. She has no wheezes. She has no rales.  Abdominal: Soft. Bowel sounds are normal. She exhibits no distension. There is no tenderness. There is no rebound.  Musculoskeletal: Normal range of motion.  Neurological: She is alert and oriented to  person, place, and time.  Skin: Skin is warm and dry. No rash noted.  Psychiatric: She has a normal mood and affect. Her behavior is normal.    ED Course  Procedures (including critical care time) Labs Review Labs Reviewed - No data to display  Imaging Review No results found. I have personally reviewed and evaluated these images and lab results as part of my medical decision-making.   EKG Interpretation None      MDM   Final diagnoses:  Other migraine with status migrainosus, intractable    Patient on a multitude of medications. Followed by neurology. His had intractable headaches in September. Admits to some relief with medications here. Awaiting discharge and completion of IV fluids.    Tanna Furry, MD 01/22/16 [place], MD 01/22/16 [phone]

## 2016-03-26 ENCOUNTER — Telehealth: Payer: Self-pay | Admitting: Neurology

## 2016-03-26 NOTE — Telephone Encounter (Signed)
Pt called sts she was referred to [facility],said it is not a pain clinic, she was seen 6/29. She wants to know if she needs to keep to her appt at [facility] on [facility]? Please call

## 2016-03-26 NOTE — Telephone Encounter (Signed)
Returned pt TC. Says that she was seen at [facility] headache clinic at the end of last month. Does not feel that she needs to keep her appt on Wed to f/u on headaches. Agreed to continue her visits w/ Dr. Brett Fairy for sleep apnea f/u. Wed appt cancelled per pt request.

## 2016-03-28 ENCOUNTER — Ambulatory Visit (INDEPENDENT_AMBULATORY_CARE_PROVIDER_SITE_OTHER): Payer: 59 | Admitting: Adult Health

## 2016-03-28 ENCOUNTER — Encounter: Payer: Self-pay | Admitting: Adult Health

## 2016-03-28 VITALS — BP 107/72 | HR 87 | Ht 62.0 in | Wt 212.8 lb

## 2016-03-28 DIAGNOSIS — G4730 Sleep apnea, unspecified: Secondary | ICD-10-CM

## 2016-03-28 DIAGNOSIS — G4733 Obstructive sleep apnea (adult) (pediatric): Secondary | ICD-10-CM

## 2016-03-28 NOTE — Patient Instructions (Signed)
Mask needs to be refitted Will set up appointment with Robin in Sleep Lab If your symptoms worsen or you develop new symptoms please let us know.

## 2016-03-28 NOTE — Progress Notes (Signed)
PATIENT: [name] DOB: [birth year]  REASON FOR VISIT: follow up- OSA on CPAP HISTORY FROM: patient  HISTORY OF PRESENT ILLNESS: Ms. Spakes is a 45 year old female with a history of obstructive sleep apnea on CPAP. She is here today for a compliance download. Her download indicates that she uses her machine 28 out of 30 days for compliance of 93%. She uses her machine greater than 4 hours 23 out of 30 days for compliance of 77%. On average she uses her machine 6 hours and 10 minutes. Her residual AHI is 1.3 6.6 cm of water. She does have a significant leak and the 95th percentile at 84.1 L/m. She does not recall when she has changed her mask or straps. She states that overall she is sleeping well. She is no longer taking her stimulant. She states that she is doing well with staying awake during the day. She is now being managed by [facility]. She returns today for an evaluation.   REVIEW OF SYSTEMS: Out of a complete 14 system review of symptoms, the patient complains only of the following symptoms, and all other reviewed systems are negative.  [place] sleepiness score is 0, fatigue severity score is 16 Light sensitivity, excessive sweating, cold intolerance, heat intolerance, diarrhea, nausea, insomnia, apnea, depression, nervous or changes, hallucinations, incontinence of bladder, memory loss, dizziness, headache, environmental allergies  ALLERGIES: Allergies  Allergen Reactions  . Dilaudid [Hydromorphone Hcl] Itching  . Iodinated Diagnostic Agents Other (See Comments)    unknown  . Ketorolac Tromethamine Swelling  . Nsaids     Can't take for fibromyalgia   . Salicylates Other (See Comments)    unknown  . Tape Other (See Comments)    Blisters - any type of tape and Band-Aids     HOME MEDICATIONS: Outpatient Prescriptions Prior to Visit  Medication Sig Dispense Refill  . acyclovir (ZOVIRAX) 400 MG tablet TAKE 1 TABLET BY MOUTH DAILY    . albuterol (PROVENTIL  HFA;VENTOLIN HFA) 108 (90 BASE) MCG/ACT inhaler Inhale 1 puff into the lungs every 6 (six) hours as needed for wheezing or shortness of breath.    . Armodafinil (NUVIGIL) 250 MG tablet Take 250 mg by mouth daily.    . Biotin 5000 MCG CAPS Take 10,000 mcg by mouth 2 (two) times daily.     . budesonide-formoterol (SYMBICORT) 80-4.5 MCG/ACT inhaler Inhale 2 puffs into the lungs 2 (two) times daily.    . Carboxymethylcellulose Sodium (REFRESH LIQUIGEL OP) Apply 1 drop to eye every 4 (four) hours as needed (dryness).     . cetirizine (ZYRTEC) 10 MG tablet Take 10 mg by mouth daily.    Marland Kitchen dexmethylphenidate (FOCALIN) 10 MG tablet Take 10 mg by mouth 3 (three) times daily.    . diazepam (VALIUM) 2 MG tablet Take 1-4 mg by mouth 3 (three) times daily. Take 1mg  tablet twice daily and 4mg  every night at bedtime.    . fenofibrate (TRICOR) 145 MG tablet Take 145 mg by mouth at bedtime.     . fluticasone (FLONASE) 50 MCG/ACT nasal spray Place 2 sprays into the nose daily as needed for allergies.     Marland Kitchen gabapentin (NEURONTIN) 600 MG tablet Take 600 mg by mouth 4 (four) times daily.    Marland Kitchen HYDROcodone-acetaminophen (NORCO) 7.5-325 MG tablet Take 1 tablet by mouth 3 (three) times daily.     Marland Kitchen lamoTRIgine (LAMICTAL) 200 MG tablet Take 200 mg by mouth at bedtime.     Marland Kitchen levothyroxine (SYNTHROID,  LEVOTHROID) 75 MCG tablet Take 75 mcg by mouth daily before breakfast.    . meloxicam (MOBIC) 15 MG tablet Take 15 mg by mouth daily as needed for pain.     . metaxalone (SKELAXIN) 800 MG tablet Take 800 mg by mouth 3 (three) times daily as needed for muscle spasms.     . Multiple Vitamin (MULTIVITAMIN WITH MINERALS) TABS tablet Take 1 tablet by mouth daily.    . polyethylene glycol (MIRALAX / GLYCOLAX) packet Take 17 g by mouth daily as needed for mild constipation.     . pravastatin (PRAVACHOL) 20 MG tablet Take 20 mg by mouth at bedtime.     . senna (SENOKOT) 8.6 MG TABS tablet Take 3 tablets by mouth 2 (two) times daily.       . trospium (SANCTURA) 20 MG tablet Take 20 mg by mouth 2 (two) times daily.    Marland Kitchen UNABLE TO FIND Take 1 Applicatorful by mouth 2 (two) times daily. Med Name: Lidocaine- antiacid solution    . urea (CARMOL) 40 % CREA Apply 1 application topically at bedtime.    Marland Kitchen venlafaxine XR (EFFEXOR-XR) 150 MG 24 hr capsule Take 300 mg by mouth daily with breakfast.    . Cariprazine HCl (VRAYLAR) 3 MG CAPS Take 6 mg by mouth at bedtime. Reported on 03/28/2016    . dexmethylphenidate (FOCALIN XR) 20 MG 24 hr capsule Take 20 mg by mouth every morning. Reported on 03/28/2016    . hydrochlorothiazide (MICROZIDE) 12.5 MG capsule Take 12.5 mg by mouth daily. Reported on 03/28/2016    . propranolol ER (INDERAL LA) 120 MG 24 hr capsule Take 1 capsule (120 mg total) by mouth daily. (Patient taking differently: Take 80 mg by mouth daily. ) 30 capsule 3   No facility-administered medications prior to visit.    PAST MEDICAL HISTORY: Past Medical History  Diagnosis Date  . Depression   . Thyroid disease   . Anxiety   . Obsessive-compulsive disorder   . Fatigue   . Asthma   . Sleep apnea   . COPD (chronic obstructive pulmonary disease) ([place])   . Collapsed lung     right lung  . Migraine   . Fibromyalgia   . Bell's palsy   . Dislocated shoulder   . 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 GERD (gastroesophageal reflux disease)   . Dyslipidemia   . Bipolar disorder ([place])   . OSA (obstructive sleep apnea)   . Hypercholesteremia   . Cancer ([place])     skin  . Chronic headache disorder 03/08/2015  . Nutcracker esophagus     PAST SURGICAL HISTORY: Past Surgical History  Procedure Laterality Date  . Abdominal hysterectomy    . Cesarean section    . Ans unit     . Gallbladder removed    . Dnc    . Gallbladder surgery      1998  . Ans      2001, 2010  . Cesarean section      2007  . Dilation and curettage of uterus      2007  . Tens unit      04/2014  . Stretching of esophagus    . Esophageal manometry  N/A 04/29/2015    Procedure: ESOPHAGEAL MANOMETRY (EM);  Surgeon: Arta Silence, MD;  Location: [facility];  Service: Endoscopy;  Laterality: N/A;    FAMILY HISTORY: Family History  Problem Relation Age of Onset  . Adopted: Yes  . Depression Mother   .  Dementia Mother   . Depression Maternal Grandmother   . Diabetes Father     SOCIAL HISTORY: Social History   Social History  . Marital Status: Married    Spouse Name: N/A  . Number of Children: 1  . Years of Education: college   Occupational History  . disabled    Social History Main Topics  . Smoking status: Former Smoker    Quit date: 06/25/2000  . Smokeless tobacco: Never Used  . Alcohol Use: No  . Drug Use: No  . Sexual Activity: Not on file   Other Topics Concern  . Not on file   Social History Narrative   Caffeine - patient is trying to stop drinking caffeine (she drinks 1 "huge" cup of caffeine daily)   Patient is right handed.      PHYSICAL EXAM  Filed Vitals:   03/28/16 1050  BP: 107/72  Pulse: 87  Height: 5\' 2"  (1.575 m)  Weight: 212 lb 12.8 oz (96.525 kg)   Body mass index is 38.91 kg/(m^2).  Generalized: Well developed, in no acute distress  Neck: Conference 16 inches, Mallampati 3  Neurological examination  Mentation: Alert oriented to time, place, history taking. Follows all commands speech and language fluent Cranial nerve II-XII: Pupils were equal round reactive to light. Extraocular movements were full, visual field were full on confrontational test. Facial sensation and strength were normal. Uvula tongue midline. Head turning and shoulder shrug  were normal and symmetric. Facial asymmetry on the right Bell's palsy Motor: The motor testing reveals 5 over 5 strength of all 4 extremities. Good symmetric motor tone is noted throughout.  Sensory: Sensory testing is intact to soft touch on all 4 extremities. No evidence of extinction is noted.  Coordination: Cerebellar testing reveals good  finger-nose-finger and heel-to-shin bilaterally.  Gait and station: Gait is normal. Tandem gait is normal. Romberg is negative. No drift is seen.  Reflexes: Deep tendon reflexes are symmetric and normal bilaterally.   DIAGNOSTIC DATA (LABS, IMAGING, TESTING) - I reviewed patient records, labs, notes, testing and imaging myself where available.  Lab Results  Component Value Date   WBC 5.6 10/10/2015   HGB 12.4 10/10/2015   HCT 36.2 10/10/2015   MCV 83.4 10/10/2015   PLT 248 10/10/2015      Component Value Date/Time   NA 139 10/10/2015 0223   K 3.5 10/10/2015 0223   CL 100* 10/10/2015 0223   CO2 28 10/10/2015 0223   GLUCOSE 114* 10/10/2015 0223   BUN 13 10/10/2015 0223   CREATININE 0.87 10/10/2015 0223   CREATININE 0.71 06/17/2012 1325   CALCIUM 9.2 10/10/2015 0223   PROT 6.5 06/17/2015 1940   ALBUMIN 4.0 06/17/2015 1940   AST 16 06/17/2015 1940   ALT 17 06/17/2015 1940   ALKPHOS 59 06/17/2015 1940   BILITOT 0.7 06/17/2015 1940   GFRNONAA >60 10/10/2015 0223   GFRAA >60 10/10/2015 0223       ASSESSMENT AND PLAN 45 y.o. 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 here with:  1. Obstructive sleep apnea on CPAP  The patient's compliance download indicates that she does have a significant leak. She will come in next week  to have her mask refitted with Robin in the sleep lab. Patient advised that she should continue using the CPAP nightly. She will follow-up in 6 months with Dr. Mechele Claude, MSN,  NP-C 03/28/2016, 11:14 AM [facility] [address], [place], [place] [address] [phone]

## 2016-03-28 NOTE — Progress Notes (Signed)
I agree with the assessment and plan as directed by NP .The patient is known to me .   Kaiyana Bedore, MD  

## 2016-04-03 ENCOUNTER — Telehealth: Payer: Self-pay

## 2016-04-03 NOTE — Telephone Encounter (Signed)
Patient came to sleep lab for mask fitting. She is having trouble with her mask leaking when she turns over. She is currently on Res Med airfit N10 small. She needs an xsmall but this mask does not have that size. Switched her to a Respironics wisp small. With cpap of 5 there was no leak. This is a much better seal. A small in this brand is like an xsmall in Res med. Gave her a sample to take home.

## 2016-04-24 ENCOUNTER — Encounter: Payer: Self-pay | Admitting: *Deleted

## 2016-05-09 ENCOUNTER — Ambulatory Visit: Payer: Medicare Other | Admitting: Neurology

## 2016-05-09 ENCOUNTER — Telehealth: Payer: Self-pay | Admitting: Neurology

## 2016-05-09 DIAGNOSIS — Z9989 Dependence on other enabling machines and devices: Principal | ICD-10-CM

## 2016-05-09 DIAGNOSIS — G4733 Obstructive sleep apnea (adult) (pediatric): Secondary | ICD-10-CM

## 2016-05-09 NOTE — Telephone Encounter (Signed)
I spoke to pt. She says that [facility] has never contacted her about supplies. She was a Respironics Wisp mask in Small. She also says that he cpap smells and he can barely tolerate it. I advised pt that I would also ask [facility] to check the smell out. I advised pt that requests for supplies and cpap problems should be first directed to [facility]. Pt verbalized understanding.

## 2016-05-09 NOTE — Addendum Note (Signed)
Addended by: Lester Ansley A on: 05/09/2016 04:00 PM   Modules accepted: Orders

## 2016-05-09 NOTE — Telephone Encounter (Signed)
Patient is calling regarding the order for CPAP supplies she should be getting from [place]. Please call and discuss.

## 2016-05-15 DIAGNOSIS — G43719 Chronic migraine without aura, intractable, without status migrainosus: Secondary | ICD-10-CM | POA: Insufficient documentation

## 2016-05-24 IMAGING — RF DG FLUORO GUIDE NDL PLC/BX
3 series · 3 of 3 positions shown · non-contrast
Comparison: none

CLINICAL DATA: LEFT shoulder pain.  Prior rotator cuff tear repair.

[Series 1: run · 1 of 1 slices shown (1 of 3)]
[im 1/1]
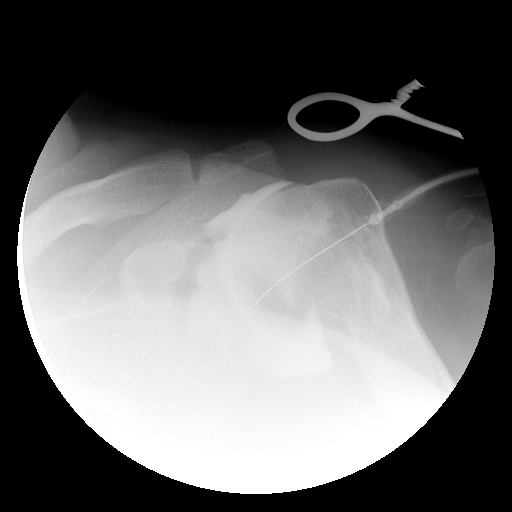

[Series 2: run · 1 of 1 slices shown (2 of 3)]
[im 1/1]
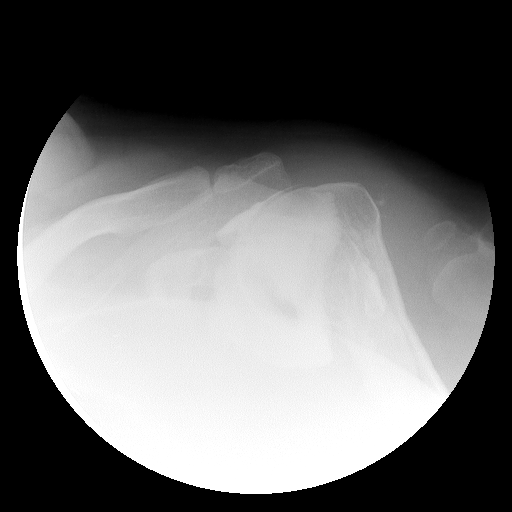

[Series 3: run · 1 of 1 slices shown (3 of 3)]
[im 1/1]
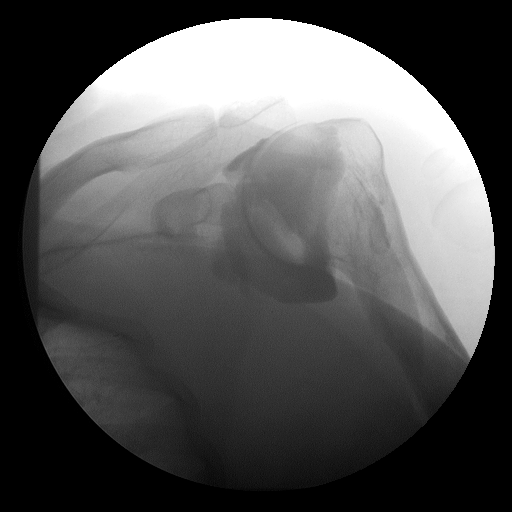

[3 of 3 positions shown; findings below may reference images not displayed]

FLUOROSCOPY TIME:  30 seconds corresponding to a dose of 3.6 dGy cm
squared.

PROCEDURE:
LEFT SHOULDER INJECTION UNDER FLUOROSCOPY

Informed written consent was obtained.  Time-out was performed.

An appropriate skin entrance site was determined. The site was
marked, prepped with Betadine, draped in the usual sterile fashion,
and infiltrated locally with 1% Lidocaine. 22 gauge spinal needle
was advanced to the superomedial margin of the humeral head under
intermittent fluoroscopy. 1 ml of Lidocaine injected easily. A
mixture of 0.1 ml Multihance and 20 ml of dilute Omnipaque 180 was
then used to opacify the LEFT shoulder capsule. No immediate
complication.
IMPRESSION: Technically successful LEFT shoulder injection for MRI.

## 2016-08-23 ENCOUNTER — Telehealth: Payer: Self-pay | Admitting: Adult Health

## 2016-08-23 ENCOUNTER — Encounter (HOSPITAL_COMMUNITY): Payer: Self-pay | Admitting: *Deleted

## 2016-08-23 ENCOUNTER — Emergency Department (HOSPITAL_COMMUNITY)
Admission: EM | Admit: 2016-08-23 | Discharge: 2016-08-23 | Disposition: A | Discharge status: Home / Self Care | Payer: 59 | Attending: Physician Assistant | Admitting: Physician Assistant

## 2016-08-23 DIAGNOSIS — G8918 Other acute postprocedural pain: Secondary | ICD-10-CM | POA: Diagnosis not present

## 2016-08-23 DIAGNOSIS — J449 Chronic obstructive pulmonary disease, unspecified: Secondary | ICD-10-CM | POA: Insufficient documentation

## 2016-08-23 DIAGNOSIS — Z87891 Personal history of nicotine dependence: Secondary | ICD-10-CM | POA: Diagnosis not present

## 2016-08-23 DIAGNOSIS — Z85828 Personal history of other malignant neoplasm of skin: Secondary | ICD-10-CM | POA: Insufficient documentation

## 2016-08-23 MED ORDER — NAPROXEN 250 MG PO TABS
500.0000 mg | ORAL_TABLET | Freq: Once | ORAL | Status: AC
  Administered 2016-08-23: 500 mg via ORAL
  Filled 2016-08-23: qty 2

## 2016-08-23 MED ORDER — LORAZEPAM 1 MG PO TABS
1.0000 mg | ORAL_TABLET | Freq: Once | ORAL | Status: AC
  Administered 2016-08-23: 1 mg via ORAL
  Filled 2016-08-23: qty 1

## 2016-08-23 MED ORDER — NAPROXEN 500 MG PO TABS: 500.0000 mg | ORAL_TABLET | Freq: Two times a day (BID) | ORAL | 0 refills | Status: DC

## 2016-08-23 MED ORDER — MORPHINE SULFATE (PF) 10 MG/ML IV SOLN
10.0000 mg | Freq: Once | INTRAVENOUS | Status: AC
  Administered 2016-08-23: 10 mg via INTRAMUSCULAR
  Filled 2016-08-23: qty 1

## 2016-08-23 MED ORDER — ONDANSETRON 4 MG PO TBDP
4.0000 mg | ORAL_TABLET | Freq: Once | ORAL | Status: AC
  Administered 2016-08-23: 4 mg via ORAL
  Filled 2016-08-23: qty 1

## 2016-08-23 NOTE — Discharge Instructions (Signed)
SEEK MEDICAL CARE IF:   Your legs or feet become painful or swollen.  You have redness, swelling, or pain at the site of your incision.  You have fluid, blood, or pus coming from your incision.  You vomit or feel nauseous.  You have weakness or numbness in your legs that is new or getting worse.  You have a fever.  You have trouble controlling urination or bowel movements.  You have chest pain.  You have trouble breathing.  You develop a cough.

## 2016-08-23 NOTE — ED Notes (Signed)
Abigail PA at bedside about patients continuing pain.

## 2016-08-23 NOTE — Telephone Encounter (Signed)
I have spoken with [name] this afternoon.  She sts. she had a stimulator removed-- has an incision behind her right ear that prevents her from wearing CPAP mask (until incision heals.)  Sts. o/w she has been compliant with CPAP.  She is established on CPAP,  is not within her 90 day compliance period.  Once incision heals, she will resume CPAP.  Will pass update along to Dr. Brett Fairy and Megan/fim

## 2016-08-23 NOTE — Telephone Encounter (Signed)
Pt called to advise she had a peripheral stimulator implant @ [facility] yesterday in her chest, wires up to neck,behind the the ear, top of the head, and over bil eyebrows. Says she is unable to use CPAP. Please call

## 2016-08-23 NOTE — ED Triage Notes (Addendum)
Pt a nerve stimulator that had been used to control migraines removed yesterday (at [facility]).  Pt now c/o pain to site (from R shoulder, up R neck, and to R forehead).  Pt describes pain as spasms.  Pt is diaphoretic. Was told by surgeon to come to ED.

## 2016-08-23 NOTE — ED Provider Notes (Signed)
[name] DEPT Provider Note   CSN: [id number] Arrival date & time: 08/23/16  1749     History   Chief Complaint Chief Complaint  Patient presents with  . Post-op Problem    HPI [name] is a 45 y.o. female . 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 The surgery was performed yesterday at the [facility] by Dr. Danelle Berry. The patient states that [name] has been on Percocet 7.5-325  before the surgery. [name] states that her pain is uncontrolled with that disposition. Begin taking 2 of them every 4-6 hours and consultation with her surgeon his nurse said that that would be okay. Patient states that that medication is not helping her. Her allergies listed NSAIDs, however, [name] states that [name] just doesn't usually take them because [name] has fibromyalgia and they don't seem to work well. [name] denies any severe headache, fevers, nausea, vomiting, difficulty swallowing, numbness, tingling or paresthesias in the arms. [name] denies any new weakness.   HPI  Past Medical History:  Diagnosis Date  . Anxiety   . Asthma   . Bell's palsy   . Bipolar disorder ([place])   . Cancer ([place])    skin  . Chronic headache disorder 03/08/2015  . Collapsed lung    right lung  . COPD (chronic obstructive pulmonary disease) ([place])   . Depression   . Dislocated shoulder   . Dyslipidemia   . Fatigue   . Fibromyalgia   . GERD (gastroesophageal reflux disease)   . Hypercholesteremia   . Migraine   . Nutcracker esophagus   . Obsessive-compulsive disorder   . OSA (obstructive sleep apnea)   . 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 Sleep apnea   . Thyroid disease     Patient Active Problem List   Diagnosis Date  Noted  . Chronic headache disorder 03/08/2015  . Chest pain syndrome 11/18/2014  . Dyslipidemia 11/18/2014  . Sleep apnea with use of continuous positive airway pressure (CPAP) 09/03/2013  . 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 Contusion of face, scalp, and neck except eye(s) 11/08/2012  . Mechanical complication of nervous system device, implant, and graft 11/08/2012  . Obstructive sleep apnea (adult) (pediatric) 11/08/2012  . Fibromyalgia 12/25/2011  . Depression, major 12/25/2011  . Bell's palsy 12/25/2011  . Chronic pain 12/25/2011  . GERD (gastroesophageal reflux disease) 12/25/2011    Past Surgical History:  Procedure Laterality Date  . ABDOMINAL HYSTERECTOMY    . ans     2001, 2010  . ANS unit     . CESAREAN SECTION    . CESAREAN SECTION     2007  . DILATION AND CURETTAGE OF UTERUS     2007  . DNC    . ESOPHAGEAL MANOMETRY N/A 04/29/2015   Procedure: ESOPHAGEAL MANOMETRY (EM);  Surgeon: Arta Silence, MD;  Location: [facility] ENDOSCOPY;  Service: Endoscopy;  Laterality: N/A;  . gallbladder removed    . GALLBLADDER SURGERY     1998  . stretching of esophagus    . Tens unit     04/2014    OB History    No data available       Home Medications    Prior to Admission medications   Medication Sig Start  Date End Date Taking? Authorizing Provider  acyclovir (ZOVIRAX) 400 MG tablet TAKE 1 TABLET BY MOUTH DAILY 09/27/14   Historical Provider, MD  albuterol (PROVENTIL HFA;VENTOLIN HFA) 108 (90 BASE) MCG/ACT inhaler Inhale 1 puff into the lungs every 6 (six) hours as needed for wheezing or shortness of breath.    Historical Provider, MD  Armodafinil (NUVIGIL) 250 MG tablet Take 250 mg by mouth daily.    Historical Provider, MD  Biotin 5000 MCG CAPS Take 10,000 mcg by mouth 2 (two) times daily.     Historical Provider, MD  budesonide-formoterol (SYMBICORT) 80-4.5 MCG/ACT inhaler Inhale 2 puffs into the lungs 2 (two) times daily.    Historical Provider, MD    Carboxymethylcellulose Sodium (REFRESH LIQUIGEL OP) Apply 1 drop to eye every 4 (four) hours as needed (dryness).     Historical Provider, MD  cetirizine (ZYRTEC) 10 MG tablet Take 10 mg by mouth daily.    Historical Provider, MD  dexmethylphenidate (FOCALIN) 10 MG tablet Take 10 mg by mouth 3 (three) times daily.    Historical Provider, MD  diazepam (VALIUM) 2 MG tablet Take 1-4 mg by mouth 3 (three) times daily. Take 1mg  tablet twice daily and 4mg  every night at bedtime.    Historical Provider, MD  fenofibrate (TRICOR) 145 MG tablet Take 145 mg by mouth at bedtime.     Historical Provider, MD  fluticasone (FLONASE) 50 MCG/ACT nasal spray Place 2 sprays into the nose daily as needed for allergies.     Historical Provider, MD  gabapentin (NEURONTIN) 600 MG tablet Take 600 mg by mouth 4 (four) times daily.    Historical Provider, MD  HYDROcodone-acetaminophen (NORCO) 7.5-325 MG tablet Take 1 tablet by mouth 3 (three) times daily.     Historical Provider, MD  lamoTRIgine (LAMICTAL) 200 MG tablet Take 200 mg by mouth at bedtime.     Historical Provider, MD  levothyroxine (SYNTHROID, LEVOTHROID) 75 MCG tablet Take 75 mcg by mouth daily before breakfast.    Historical Provider, MD  meloxicam (MOBIC) 15 MG tablet Take 15 mg by mouth daily as needed for pain.     Historical Provider, MD  metaxalone (SKELAXIN) 800 MG tablet Take 800 mg by mouth 3 (three) times daily as needed for muscle spasms.     Historical Provider, MD  Multiple Vitamin (MULTIVITAMIN WITH MINERALS) TABS tablet Take 1 tablet by mouth daily.    Historical Provider, MD  polyethylene glycol (MIRALAX / GLYCOLAX) packet Take 17 g by mouth daily as needed for mild constipation.     Historical Provider, MD  pravastatin (PRAVACHOL) 20 MG tablet Take 20 mg by mouth at bedtime.     Historical Provider, MD  propranolol ER (INDERAL LA) 80 MG 24 hr capsule TK ONE C PO D 02/24/16   Historical Provider, MD  REXULTI 3 MG TABS TK 1 T PO HS 02/20/16    Historical Provider, MD  senna (SENOKOT) 8.6 MG TABS tablet Take 3 tablets by mouth 2 (two) times daily.     Historical Provider, MD  trospium (SANCTURA) 20 MG tablet Take 20 mg by mouth 2 (two) times daily.    Historical Provider, MD  UNABLE TO FIND Take 1 Applicatorful by mouth 2 (two) times daily. Med Name: Lidocaine- antiacid solution    Historical Provider, MD  urea (CARMOL) 40 % CREA Apply 1 application topically at bedtime.    Historical Provider, MD  venlafaxine XR (EFFEXOR-XR) 150 MG 24 hr capsule Take 300 mg by mouth daily  with breakfast.    Historical Provider, MD  zonisamide (ZONEGRAN) 100 MG capsule  03/15/16   Historical Provider, MD    Family History Family History  Problem Relation Age of Onset  . Adopted: Yes  . Depression Mother   . Dementia Mother   . Depression Maternal Grandmother   . Diabetes Father     Social History Social History  Substance Use Topics  . Smoking status: Former Smoker    Quit date: 06/25/2000  . Smokeless tobacco: Never Used  . Alcohol use No     Allergies   Dilaudid [hydromorphone hcl]; Iodinated diagnostic agents; Ketorolac tromethamine; Nsaids; Salicylates; and Tape   Review of Systems Review of Systems Ten systems reviewed and are negative for acute change, except as noted in the HPI.    Physical Exam Updated Vital Signs BP 110/66   Pulse 84   Temp 97.8 F (36.6 C) (Oral)   Resp 18   SpO2 96%   Physical Exam  Constitutional: [name] is oriented to person, place, and time. [name] appears well-developed and well-nourished. No distress.  HENT:  Head: Normocephalic and atraumatic.  Well-healing surgical scar behind the right ear. Staples in place. No active drainage, no swelling or signs of infection.  Eyes: Conjunctivae and EOM are normal. Pupils are equal, round, and reactive to light. No scleral icterus.  Neck: Normal range of motion.  Cardiovascular: Normal rate, regular rhythm and normal heart sounds.  Exam reveals no gallop  and no friction rub.   No murmur heard. Pulmonary/Chest: Effort normal and breath sounds normal. No respiratory distress.  Well-healing surgical incision site of the right chest wall. Staples in place. No significant swelling, drainage. No signs of seroma or hematoma.  Abdominal: Soft. Bowel sounds are normal. [name] exhibits no distension and no mass. There is no tenderness. There is no guarding.  Neurological: [name] is alert and oriented to person, place, and time.  Skin: Skin is warm and dry. [name] is not diaphoretic.  Nursing note and vitals reviewed.        ED Treatments / Results  Labs (all labs ordered are listed, but only abnormal results are displayed) Labs Reviewed - No data to display  EKG  EKG Interpretation None       Radiology No results found.  Procedures Procedures (including critical care time)  Medications Ordered in ED Medications  Morphine Sulfate (PF) SOLN 10 mg (10 mg Intramuscular Given 08/23/16 1928)  LORazepam (ATIVAN) tablet 1 mg (1 mg Oral Given 08/23/16 1928)  ondansetron (ZOFRAN-ODT) disintegrating tablet 4 mg (4 mg Oral Given 08/23/16 1928)     Initial Impression / Assessment and Plan / ED Course  I have reviewed the triage vital signs and the nursing notes.  Pertinent labs & imaging results that were available during my care of the patient were reviewed by me and considered in my medical decision making (see chart for details).  Clinical Course     I've spoken with Dr. Randolm Idol, who is on call for [facility] surgery team. We have reviewed the patient's charts and procedure today. [name] is well-appearing. No signs of infection. Able to swallow easily, no stridor. Patient seen in shared visit with Dr. Thomasene Lot who agrees that [name] is well-appearing and may be discharged. According to plan of care with Dr. Randolm Idol, no change in her current pain meds. I will give the patient a prescription for NSAIDs. [name] is able to tolerate naproxen. Patient is to follow-up  tomorrow with her surgical care team  and [facility]. Final Clinical Impressions(s) / ED Diagnoses   Final diagnoses:  None    New Prescriptions New Prescriptions   No medications on file     Margarita Mail, PA-C 08/24/16 [place], MD 09/02/16 2022

## 2016-08-24 NOTE — Telephone Encounter (Signed)
She may need a break from using CPAP interface for 10 days or more until the tenderness resolves, agree. CD

## 2016-08-24 NOTE — Telephone Encounter (Signed)
Noted/fim 

## 2016-08-28 ENCOUNTER — Emergency Department (HOSPITAL_COMMUNITY): Payer: 59

## 2016-08-28 ENCOUNTER — Emergency Department (HOSPITAL_COMMUNITY)
Admission: EM | Admit: 2016-08-28 | Discharge: 2016-08-28 | Disposition: A | Discharge status: Home / Self Care | Payer: 59 | Attending: Emergency Medicine | Admitting: Emergency Medicine

## 2016-08-28 ENCOUNTER — Encounter (HOSPITAL_COMMUNITY): Payer: Self-pay

## 2016-08-28 DIAGNOSIS — Z87891 Personal history of nicotine dependence: Secondary | ICD-10-CM | POA: Diagnosis not present

## 2016-08-28 DIAGNOSIS — R0602 Shortness of breath: Secondary | ICD-10-CM

## 2016-08-28 DIAGNOSIS — Z85828 Personal history of other malignant neoplasm of skin: Secondary | ICD-10-CM | POA: Insufficient documentation

## 2016-08-28 DIAGNOSIS — J449 Chronic obstructive pulmonary disease, unspecified: Secondary | ICD-10-CM | POA: Diagnosis not present

## 2016-08-28 DIAGNOSIS — R51 Headache: Secondary | ICD-10-CM | POA: Insufficient documentation

## 2016-08-28 DIAGNOSIS — J0100 Acute maxillary sinusitis, unspecified: Secondary | ICD-10-CM | POA: Diagnosis not present

## 2016-08-28 LAB — COMPREHENSIVE METABOLIC PANEL
ALBUMIN: 4.1 g/dL (ref 3.5–5.0)
ALK PHOS: 58 U/L (ref 38–126)
ALT: 62 U/L — AB (ref 14–54)
ANION GAP: 8 (ref 5–15)
AST: 58 U/L — ABNORMAL HIGH (ref 15–41)
BUN: 16 mg/dL (ref 6–20)
CALCIUM: 9.2 mg/dL (ref 8.9–10.3)
CO2: 27 mmol/L (ref 22–32)
Chloride: 102 mmol/L (ref 101–111)
Creatinine, Ser: 0.99 mg/dL (ref 0.44–1.00)
GFR calc Af Amer: >60 mL/min (ref >60)
GFR calc non Af Amer: >60 mL/min (ref >60)
GLUCOSE: 122 mg/dL — AB (ref 65–99)
Potassium: 3.9 mmol/L (ref 3.5–5.1)
Sodium: 137 mmol/L (ref 135–145)
Total Bilirubin: 0.3 mg/dL (ref 0.3–1.2)
Total Protein: 6.1 g/dL — ABNORMAL LOW (ref 6.5–8.1)

## 2016-08-28 LAB — CBC WITH DIFFERENTIAL/PLATELET
BASOS PCT: 1 %
Basophils Absolute: 0 10*3/uL (ref 0.0–0.1)
Eosinophils Absolute: 0.2 10*3/uL (ref 0.0–0.7)
Eosinophils Relative: 6 %
HEMATOCRIT: 31.7 % — AB (ref 36.0–46.0)
Hemoglobin: 10.2 g/dL — ABNORMAL LOW (ref 12.0–15.0)
LYMPHS ABS: 1.2 10*3/uL (ref 0.7–4.0)
Lymphocytes Relative: 28 %
MCH: 27.7 pg (ref 26.0–34.0)
MCHC: 32.2 g/dL (ref 30.0–36.0)
MCV: 86.1 fL (ref 78.0–100.0)
MONO ABS: 0.3 10*3/uL (ref 0.1–1.0)
MONOS PCT: 7 %
Neutro Abs: 2.5 10*3/uL (ref 1.7–7.7)
Neutrophils Relative %: 60 %
Platelets: 214 10*3/uL (ref 150–400)
RBC: 3.68 MIL/uL — ABNORMAL LOW (ref 3.87–5.11)
RDW: 12.9 % (ref 11.5–15.5)
WBC: 4.3 10*3/uL (ref 4.0–10.5)

## 2016-08-28 LAB — I-STAT TROPONIN, ED: Troponin i, poc: 0 ng/mL (ref 0.00–0.08)

## 2016-08-28 LAB — D-DIMER, QUANTITATIVE: D-Dimer, Quant: 0.37 ug/mL-FEU (ref 0.00–0.50)

## 2016-08-28 MED ORDER — MECLIZINE HCL 25 MG PO TABS
25.0000 mg | ORAL_TABLET | Freq: Once | ORAL | Status: AC
  Administered 2016-08-28: 25 mg via ORAL
  Filled 2016-08-28: qty 1

## 2016-08-28 MED ORDER — ONDANSETRON 4 MG PO TBDP
4.0000 mg | ORAL_TABLET | Freq: Once | ORAL | Status: AC
  Administered 2016-08-28: 4 mg via ORAL
  Filled 2016-08-28: qty 1

## 2016-08-28 MED ORDER — AMOXICILLIN-POT CLAVULANATE 875-125 MG PO TABS: 1.0000 | ORAL_TABLET | Freq: Two times a day (BID) | ORAL | 0 refills | Status: DC

## 2016-08-28 NOTE — ED Notes (Signed)
[name] SpO2 was 98% on room air.  Pt placed on 2 L/min Nasal Cannula per her request, per Steffanie Dunn, RN.

## 2016-08-28 NOTE — ED Triage Notes (Signed)
Pt brought in by EMS due to having SOB. Pt seen by PCP yesterday and diagnosed with bronchitis. Pt received prednisone. Pt a&ox4. Pt had neuro stimulator put in recently, one behind right ear and one in right leg. Pt recevied. 5mg  of albuterol and 125mg  of solumedrol.

## 2016-08-28 NOTE — ED Provider Notes (Signed)
[facility] DEPT Provider Note   CSN: [id number] Arrival date & time: 08/28/16  [id number]     History   Chief Complaint Chief Complaint  Patient presents with  . Shortness of Breath    HPI [name] is a 45 y.o. female.  The history is provided by the patient. No language interpreter was used.  Shortness of Breath    [name] is a 45 y.o. female who presents to the Emergency Department complaining of SOB.  She reports 2 days of increased shortness of breath with associated nonproductive cough and chest pain. Her pain is located in her central chest described as a grabbing sensation. Her chest pain is constant with no clear alleviating or worsening factors. One week ago she had ANS stimulators removed from her chest and head. They were in place for 7 years. She tried albuterol treatments at home with partial improvement in her symptoms. She has associated nausea and severe dizziness. Past Medical History:  Diagnosis Date  . Anxiety   . Asthma   . Bell's palsy   . Bipolar disorder ([place])   . Cancer ([place])    skin  . Chronic headache disorder 03/08/2015  . Collapsed lung    right lung  . COPD (chronic obstructive pulmonary disease) ([place])   . Depression   . Dislocated shoulder   . Dyslipidemia   . Fatigue   . Fibromyalgia   . GERD (gastroesophageal reflux disease)   . Hypercholesteremia   . Migraine   . Nutcracker esophagus   . Obsessive-compulsive disorder   . OSA (obstructive sleep apnea)   . 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 Sleep apnea   . Thyroid disease     Patient Active Problem List   Diagnosis Date Noted  . Chronic headache disorder 03/08/2015  . Chest pain syndrome 11/18/2014  . Dyslipidemia 11/18/2014  . Sleep apnea with use of continuous positive airway pressure (CPAP) 09/03/2013  . 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 Contusion of face, scalp, and neck except eye(s) 11/08/2012  . Mechanical complication of nervous system device,  implant, and graft 11/08/2012  . Obstructive sleep apnea (adult) (pediatric) 11/08/2012  . Fibromyalgia 12/25/2011  . Depression, major 12/25/2011  . Bell's palsy 12/25/2011  . Chronic pain 12/25/2011  . GERD (gastroesophageal reflux disease) 12/25/2011    Past Surgical History:  Procedure Laterality Date  . ABDOMINAL HYSTERECTOMY    . ans     2001, 2010  . ANS unit     . CESAREAN SECTION    . CESAREAN SECTION     2007  . DILATION AND CURETTAGE OF UTERUS     2007  . DNC    . ESOPHAGEAL MANOMETRY N/A 04/29/2015   Procedure: ESOPHAGEAL MANOMETRY (EM);  Surgeon: Arta Silence, MD;  Location: [facility] ENDOSCOPY;  Service: Endoscopy;  Laterality: N/A;  . gallbladder removed    . GALLBLADDER SURGERY     1998  . stretching of esophagus    . Tens unit     04/2014    OB History    No data available       Home Medications    Prior to Admission medications   Medication Sig Start Date End Date Taking? Authorizing Provider  acyclovir (ZOVIRAX) 400 MG tablet TAKE 1 TABLET BY MOUTH DAILY 09/27/14  Yes Historical Provider, MD  albuterol (PROVENTIL HFA;VENTOLIN HFA) 108 (90 BASE) MCG/ACT inhaler Inhale 1 puff into the lungs every 6 (six) hours as needed for wheezing  or shortness of breath.   Yes Historical Provider, MD  Armodafinil (NUVIGIL) 250 MG tablet Take 250 mg by mouth daily.   Yes Historical Provider, MD  benzonatate (TESSALON) 100 MG capsule Take 100 mg by mouth 3 (three) times daily as needed for cough.   Yes Historical Provider, MD  Biotin 5000 MCG CAPS Take 10,000 mcg by mouth 2 (two) times daily.    Yes Historical Provider, MD  Carboxymethylcellulose Sodium (REFRESH LIQUIGEL OP) Apply 1 drop to eye every 4 (four) hours as needed (dryness).    Yes Historical Provider, MD  cetirizine (ZYRTEC) 10 MG tablet Take 10 mg by mouth daily.   Yes Historical Provider, MD  dexmethylphenidate (FOCALIN) 10 MG tablet Take 10 mg by mouth 3 (three) times daily.   Yes Historical Provider, MD    diazepam (VALIUM) 2 MG tablet Take 4 mg by mouth at bedtime.    Yes Historical Provider, MD  fenofibrate (TRICOR) 145 MG tablet Take 145 mg by mouth at bedtime.    Yes Historical Provider, MD  fluticasone (FLONASE) 50 MCG/ACT nasal spray Place 2 sprays into the nose daily as needed for allergies.    Yes Historical Provider, MD  gabapentin (NEURONTIN) 600 MG tablet Take 600 mg by mouth 4 (four) times daily.   Yes Historical Provider, MD  lamoTRIgine (LAMICTAL) 200 MG tablet Take 200 mg by mouth at bedtime.    Yes Historical Provider, MD  levothyroxine (SYNTHROID, LEVOTHROID) 75 MCG tablet Take 75 mcg by mouth daily before breakfast.   Yes Historical Provider, MD  meloxicam (MOBIC) 15 MG tablet Take 15 mg by mouth daily as needed for pain.    Yes Historical Provider, MD  metaxalone (SKELAXIN) 800 MG tablet Take 800 mg by mouth 3 (three) times daily as needed for muscle spasms.    Yes Historical Provider, MD  Multiple Vitamin (MULTIVITAMIN WITH MINERALS) TABS tablet Take 1 tablet by mouth daily.   Yes Historical Provider, MD  naproxen (NAPROSYN) 500 MG tablet Take 1 tablet (500 mg total) by mouth 2 (two) times daily with a meal. 08/23/16  Yes Margarita Mail, PA-C  OXcarbazepine (TRILEPTAL) 150 MG tablet Take 150 mg by mouth 2 (two) times daily.   Yes Historical Provider, MD  oxyCODONE-acetaminophen (PERCOCET) 7.5-325 MG tablet Take 1 tablet by mouth every 8 (eight) hours as needed for severe pain.   Yes Historical Provider, MD  polyethylene glycol (MIRALAX / GLYCOLAX) packet Take 17 g by mouth daily as needed for mild constipation.    Yes Historical Provider, MD  pravastatin (PRAVACHOL) 20 MG tablet Take 20 mg by mouth at bedtime.    Yes Historical Provider, MD  propranolol ER (INDERAL LA) 80 MG 24 hr capsule TK ONE C PO D 02/24/16  Yes Historical Provider, MD  REXULTI 3 MG TABS TK 1 T PO HS 02/20/16  Yes Historical Provider, MD  senna (SENOKOT) 8.6 MG TABS tablet Take 3 tablets by mouth 2 (two) times  daily.    Yes Historical Provider, MD  trospium (SANCTURA) 20 MG tablet Take 20 mg by mouth 2 (two) times daily.   Yes Historical Provider, MD  UNABLE TO FIND Take 1 Applicatorful by mouth 2 (two) times daily. Med Name: Lidocaine- antiacid solution   Yes Historical Provider, MD  urea (CARMOL) 40 % CREA Apply 1 application topically at bedtime.   Yes Historical Provider, MD  venlafaxine XR (EFFEXOR-XR) 150 MG 24 hr capsule Take 300 mg by mouth daily with breakfast.   Yes  Historical Provider, MD  amoxicillin-clavulanate (AUGMENTIN) 875-125 MG tablet Take 1 tablet by mouth every 12 (twelve) hours. 08/28/16   Quintella Reichert, MD  budesonide-formoterol [facility]) 80-4.5 MCG/ACT inhaler Inhale 2 puffs into the lungs 2 (two) times daily.    Historical Provider, MD  HYDROcodone-acetaminophen (NORCO) 7.5-325 MG tablet Take 1 tablet by mouth 3 (three) times daily.     Historical Provider, MD  zonisamide (ZONEGRAN) 100 MG capsule  03/15/16   Historical Provider, MD    Family History Family History  Problem Relation Age of Onset  . Adopted: Yes  . Depression Mother   . Dementia Mother   . Depression Maternal Grandmother   . Diabetes Father     Social History Social History  Substance Use Topics  . Smoking status: Former Smoker    Quit date: 06/25/2000  . Smokeless tobacco: Never Used  . Alcohol use No     Allergies   Dilaudid [hydromorphone hcl]; Iodinated diagnostic agents; Ketorolac tromethamine; Nsaids; Salicylates; and Tape   Review of Systems Review of Systems  Respiratory: Positive for shortness of breath.   All other systems reviewed and are negative.    Physical Exam Updated Vital Signs BP 112/64   Pulse 96   Temp 98.2 F (36.8 C) (Oral)   Resp 12   SpO2 99%   Physical Exam  Constitutional: She appears well-developed and well-nourished.  HENT:  Head: Normocephalic.  Healing surgical wound to the right postauricular region without any erythema or exudate.  Eyes:  Pupils are equal, round, and reactive to light.  Cardiovascular: Normal rate and regular rhythm.   No murmur heard. Pulmonary/Chest: Effort normal and breath sounds normal. No respiratory distress.  Healing surgical wound to the right upper chest wall without any erythema or exudate.  Abdominal: Soft. There is no tenderness. There is no rebound and no guarding.  Musculoskeletal: She exhibits no edema or tenderness.  Neurological:  Drowsy. Weakness of right upper and lower facial muscles. 5 out of 5 strength in all 4 extremities.  Skin: Skin is warm and dry.  Psychiatric: She has a normal mood and affect. Her behavior is normal.  Nursing note and vitals reviewed.    ED Treatments / Results  Labs (all labs ordered are listed, but only abnormal results are displayed) Labs Reviewed  COMPREHENSIVE METABOLIC PANEL - Abnormal; Notable for the following:       Result Value   Glucose, Bld 122 (*)    Total Protein 6.1 (*)    AST 58 (*)    ALT 62 (*)    All other components within normal limits  CBC WITH DIFFERENTIAL/PLATELET - Abnormal; Notable for the following:    RBC 3.68 (*)    Hemoglobin 10.2 (*)    HCT 31.7 (*)    All other components within normal limits  D-DIMER, QUANTITATIVE (NOT AT [facility])  I-STAT TROPOININ, ED    EKG  EKG Interpretation  Date/Time:  Tuesday August 28 2016 10:08:49 EST Ventricular Rate:  97 PR Interval:    QRS Duration: 96 QT Interval:  348 QTC Calculation: 442 R Axis:   67 Text Interpretation:  Sinus rhythm Low voltage, precordial leads Confirmed by Hazle Coca [phone]) on 08/28/2016 10:22:30 AM       Radiology Ct Head Wo Contrast  Result Date: 08/28/2016 CLINICAL DATA:  45 year old female with right side headache dizziness and altered mental status following recent surgical removal of superficial frontal nerve stimulators. Initial encounter. EXAM: CT HEAD WITHOUT CONTRAST TECHNIQUE: Contiguous axial  images were obtained from the base of the skull  through the vertex without intravenous contrast. COMPARISON:  Head CT 02/27/2016. FINDINGS: Brain: Cerebral volume remains normal. No midline shift, ventriculomegaly, mass effect, evidence of mass lesion, intracranial hemorrhage or evidence of cortically based acute infarction. Gray-white matter differentiation is within normal limits throughout the brain. Vascular: No suspicious intracranial vascular hyperdensity. Skull: No osseous abnormality identified. Sinuses/Orbits: Tympanic cavities and visible mastoid air cells remain clear. Increased bubbly opacity and mucosal thickening in both maxillary sinuses and sphenoid sinuses. Stable minimal to Mild ethmoid sinus mucosal thickening. Frontal sinuses remain clear. Other: Skin staples posterior to the right ear. The right side approach bifrontal scalp nerve stimulators have been removed. No associated scout fluid or gas. The bilateral occipital nerve stimulators remain in place and appears stable. Visualized orbit soft tissues are within normal limits. IMPRESSION: 1. Stable and normal noncontrast CT appearance of the brain. 2. New mild to moderate maxillary and sphenoid sinus inflammatory changes suggesting acute sinusitis. 3. Interval removal of the right side approach bifrontal scalp nerve stimulators with no adverse features. Stable appearance of bilateral occipital nerve stimulators. Electronically Signed   By: Genevie Ann M.D.   On: 08/28/2016 11:45   Dg Chest Port 1 View  Result Date: 08/28/2016 CLINICAL DATA:  Shortness of breath and vicelike chest pressure, altered mental status, dizziness. History of asthma-bronchitis. EXAM: PORTABLE CHEST 1 VIEW COMPARISON:  PA and lateral chest x-ray of October 10, 2015 FINDINGS: The lungs are adequately inflated and clear. There is no pneumothorax, pneumomediastinum, or pleural effusion. The heart and pulmonary vascularity are normal. The mediastinum is normal in width. The patient has undergone removal of a nerve  stimulator generator from the right pectoral region. Skin staples are present over the upper right pectoral region. The observed bony thorax is unremarkable. IMPRESSION: There is no active cardiopulmonary disease. Electronically Signed   By: David  Martinique M.D.   On: 08/28/2016 11:04    Procedures Procedures (including critical care time)  Medications Ordered in ED Medications  ondansetron (ZOFRAN-ODT) disintegrating tablet 4 mg (4 mg Oral Given 08/28/16 1200)  meclizine (ANTIVERT) tablet 25 mg (25 mg Oral Given 08/28/16 1200)     Initial Impression / Assessment and Plan / ED Course  I have reviewed the triage vital signs and the nursing notes.  Pertinent labs & imaging results that were available during my care of the patient were reviewed by me and considered in my medical decision making (see chart for details).  Clinical Course     Patient here with increased shortness of breath, chest tightness, dizziness, nausea. She describes vertiginous type symptoms in the emergency department. CT scan is consistent with acute sinusitis, we will treat given her symptoms. Presentation is not consistent with ACS or PE. Her lungs are clear on examination with no respiratory distress. She was treated with 1 time dose of IM steroids yesterday by PCP. Discussed home care for sinusitis as well as shortness of breath and possible bronchitis. Discussed outpatient follow-up and return precautions.  Final Clinical Impressions(s) / ED Diagnoses   Final diagnoses:  SOB (shortness of breath)  Acute maxillary sinusitis, recurrence not specified    New Prescriptions Discharge Medication List as of 08/28/2016 12:15 PM    START taking these medications   Details  amoxicillin-clavulanate (AUGMENTIN) 875-125 MG tablet Take 1 tablet by mouth every 12 (twelve) hours., Starting Tue 08/28/2016, Print         Quintella Reichert, MD 08/28/16 [phone]

## 2016-08-31 ENCOUNTER — Observation Stay (HOSPITAL_COMMUNITY)
Admission: EM | Admit: 2016-08-31 | Discharge: 2016-09-02 | Disposition: A | Discharge status: Home / Self Care | Payer: 59 | Attending: Family Medicine | Admitting: Family Medicine

## 2016-08-31 ENCOUNTER — Encounter (HOSPITAL_COMMUNITY): Payer: Self-pay

## 2016-08-31 ENCOUNTER — Emergency Department (HOSPITAL_COMMUNITY): Payer: 59

## 2016-08-31 DIAGNOSIS — M797 Fibromyalgia: Secondary | ICD-10-CM | POA: Diagnosis not present

## 2016-08-31 DIAGNOSIS — Z818 Family history of other mental and behavioral disorders: Secondary | ICD-10-CM | POA: Insufficient documentation

## 2016-08-31 DIAGNOSIS — Z90710 Acquired absence of both cervix and uterus: Secondary | ICD-10-CM | POA: Insufficient documentation

## 2016-08-31 DIAGNOSIS — Z833 Family history of diabetes mellitus: Secondary | ICD-10-CM | POA: Insufficient documentation

## 2016-08-31 DIAGNOSIS — R7989 Other specified abnormal findings of blood chemistry: Secondary | ICD-10-CM

## 2016-08-31 DIAGNOSIS — G43909 Migraine, unspecified, not intractable, without status migrainosus: Secondary | ICD-10-CM | POA: Insufficient documentation

## 2016-08-31 DIAGNOSIS — E7800 Pure hypercholesterolemia, unspecified: Secondary | ICD-10-CM | POA: Diagnosis not present

## 2016-08-31 DIAGNOSIS — N179 Acute kidney failure, unspecified: Secondary | ICD-10-CM | POA: Diagnosis not present

## 2016-08-31 DIAGNOSIS — J019 Acute sinusitis, unspecified: Secondary | ICD-10-CM | POA: Diagnosis not present

## 2016-08-31 DIAGNOSIS — R0609 Other forms of dyspnea: Secondary | ICD-10-CM | POA: Diagnosis not present

## 2016-08-31 DIAGNOSIS — J449 Chronic obstructive pulmonary disease, unspecified: Secondary | ICD-10-CM | POA: Insufficient documentation

## 2016-08-31 DIAGNOSIS — K219 Gastro-esophageal reflux disease without esophagitis: Secondary | ICD-10-CM | POA: Diagnosis not present

## 2016-08-31 DIAGNOSIS — G4733 Obstructive sleep apnea (adult) (pediatric): Secondary | ICD-10-CM | POA: Diagnosis not present

## 2016-08-31 DIAGNOSIS — I081 Rheumatic disorders of both mitral and tricuspid valves: Secondary | ICD-10-CM | POA: Insufficient documentation

## 2016-08-31 DIAGNOSIS — Z9049 Acquired absence of other specified parts of digestive tract: Secondary | ICD-10-CM | POA: Insufficient documentation

## 2016-08-31 DIAGNOSIS — F209 Schizophrenia, unspecified: Secondary | ICD-10-CM | POA: Diagnosis not present

## 2016-08-31 DIAGNOSIS — Z87891 Personal history of nicotine dependence: Secondary | ICD-10-CM | POA: Insufficient documentation

## 2016-08-31 DIAGNOSIS — F429 Obsessive-compulsive disorder, unspecified: Secondary | ICD-10-CM | POA: Insufficient documentation

## 2016-08-31 DIAGNOSIS — G510 Bell's palsy: Secondary | ICD-10-CM | POA: Diagnosis present

## 2016-08-31 DIAGNOSIS — R079 Chest pain, unspecified: Secondary | ICD-10-CM | POA: Diagnosis present

## 2016-08-31 DIAGNOSIS — G8929 Other chronic pain: Secondary | ICD-10-CM | POA: Diagnosis present

## 2016-08-31 DIAGNOSIS — E039 Hypothyroidism, unspecified: Secondary | ICD-10-CM | POA: Insufficient documentation

## 2016-08-31 DIAGNOSIS — F419 Anxiety disorder, unspecified: Secondary | ICD-10-CM | POA: Insufficient documentation

## 2016-08-31 DIAGNOSIS — Z91048 Other nonmedicinal substance allergy status: Secondary | ICD-10-CM | POA: Insufficient documentation

## 2016-08-31 DIAGNOSIS — Z888 Allergy status to other drugs, medicaments and biological substances status: Secondary | ICD-10-CM | POA: Insufficient documentation

## 2016-08-31 DIAGNOSIS — R0600 Dyspnea, unspecified: Principal | ICD-10-CM | POA: Diagnosis present

## 2016-08-31 DIAGNOSIS — F319 Bipolar disorder, unspecified: Secondary | ICD-10-CM | POA: Insufficient documentation

## 2016-08-31 DIAGNOSIS — I509 Heart failure, unspecified: Secondary | ICD-10-CM | POA: Insufficient documentation

## 2016-08-31 DIAGNOSIS — Z79899 Other long term (current) drug therapy: Secondary | ICD-10-CM | POA: Insufficient documentation

## 2016-08-31 DIAGNOSIS — K760 Fatty (change of) liver, not elsewhere classified: Secondary | ICD-10-CM | POA: Insufficient documentation

## 2016-08-31 LAB — DIFFERENTIAL
BASOS ABS: 0 10*3/uL (ref 0.0–0.1)
Basophils Relative: 1 %
EOS ABS: 0.3 10*3/uL (ref 0.0–0.7)
Eosinophils Relative: 5 %
LYMPHS ABS: 2.1 10*3/uL (ref 0.7–4.0)
Lymphocytes Relative: 34 %
MONOS PCT: 6 %
Monocytes Absolute: 0.3 10*3/uL (ref 0.1–1.0)
NEUTROS ABS: 3.5 10*3/uL (ref 1.7–7.7)
NEUTROS PCT: 56 %

## 2016-08-31 LAB — BASIC METABOLIC PANEL
ANION GAP: 12 (ref 5–15)
BUN: 22 mg/dL — ABNORMAL HIGH (ref 6–20)
CHLORIDE: 98 mmol/L — AB (ref 101–111)
CO2: 29 mmol/L (ref 22–32)
Calcium: 9.4 mg/dL (ref 8.9–10.3)
Creatinine, Ser: 1.27 mg/dL — ABNORMAL HIGH (ref 0.44–1.00)
GFR calc non Af Amer: 51 mL/min — ABNORMAL LOW (ref >60)
GFR, EST AFRICAN AMERICAN: 59 mL/min — AB (ref >60)
GLUCOSE: 145 mg/dL — AB (ref 65–99)
POTASSIUM: 3.8 mmol/L (ref 3.5–5.1)
Sodium: 139 mmol/L (ref 135–145)

## 2016-08-31 LAB — CBC
HEMATOCRIT: 33.7 % — AB (ref 36.0–46.0)
Hemoglobin: 10.6 g/dL — ABNORMAL LOW (ref 12.0–15.0)
MCH: 27.1 pg (ref 26.0–34.0)
MCHC: 31.5 g/dL (ref 30.0–36.0)
MCV: 86.2 fL (ref 78.0–100.0)
Platelets: 260 10*3/uL (ref 150–400)
RBC: 3.91 MIL/uL (ref 3.87–5.11)
RDW: 13.1 % (ref 11.5–15.5)
WBC: 5.2 10*3/uL (ref 4.0–10.5)

## 2016-08-31 LAB — BRAIN NATRIURETIC PEPTIDE: B Natriuretic Peptide: 323.9 pg/mL — ABNORMAL HIGH (ref 0.0–100.0)

## 2016-08-31 LAB — I-STAT TROPONIN, ED: Troponin i, poc: 0 ng/mL (ref 0.00–0.08)

## 2016-08-31 MED ORDER — GABAPENTIN 600 MG PO TABS
600.0000 mg | ORAL_TABLET | Freq: Four times a day (QID) | ORAL | Status: DC
  Administered 2016-08-31 – 2016-09-02 (×7): 600 mg via ORAL
  Filled 2016-08-31 (×7): qty 1

## 2016-08-31 MED ORDER — FENOFIBRATE 160 MG PO TABS
160.0000 mg | ORAL_TABLET | Freq: Every day | ORAL | Status: DC
  Administered 2016-09-01 – 2016-09-02 (×2): 160 mg via ORAL
  Filled 2016-08-31 (×2): qty 1

## 2016-08-31 MED ORDER — LAMOTRIGINE 25 MG PO TABS
200.0000 mg | ORAL_TABLET | Freq: Every day | ORAL | Status: DC
  Administered 2016-08-31 – 2016-09-01 (×2): 200 mg via ORAL
  Filled 2016-08-31 (×2): qty 8

## 2016-08-31 MED ORDER — VENLAFAXINE HCL ER 75 MG PO CP24
300.0000 mg | ORAL_CAPSULE | Freq: Every day | ORAL | Status: DC
  Administered 2016-09-01 – 2016-09-02 (×2): 300 mg via ORAL
  Filled 2016-08-31 (×2): qty 4

## 2016-08-31 MED ORDER — ACYCLOVIR 400 MG PO TABS
400.0000 mg | ORAL_TABLET | Freq: Every day | ORAL | Status: DC
  Administered 2016-09-01 – 2016-09-02 (×2): 400 mg via ORAL
  Filled 2016-08-31 (×2): qty 1

## 2016-08-31 MED ORDER — OXYCODONE HCL 5 MG PO TABS
2.5000 mg | ORAL_TABLET | Freq: Three times a day (TID) | ORAL | Status: DC | PRN
  Administered 2016-09-01 (×2): 2.5 mg via ORAL
  Filled 2016-08-31 (×3): qty 1

## 2016-08-31 MED ORDER — OXYCODONE-ACETAMINOPHEN 5-325 MG PO TABS
1.0000 | ORAL_TABLET | Freq: Three times a day (TID) | ORAL | Status: DC | PRN
  Administered 2016-09-01 (×2): 1 via ORAL
  Filled 2016-08-31 (×3): qty 1

## 2016-08-31 MED ORDER — ONDANSETRON HCL 4 MG/2ML IJ SOLN: 4.0000 mg | Freq: Four times a day (QID) | INTRAMUSCULAR | Status: DC | PRN

## 2016-08-31 MED ORDER — ACETAMINOPHEN 325 MG PO TABS: 650.0000 mg | ORAL_TABLET | ORAL | Status: DC | PRN

## 2016-08-31 MED ORDER — DARIFENACIN HYDROBROMIDE ER 7.5 MG PO TB24
7.5000 mg | ORAL_TABLET | Freq: Every day | ORAL | Status: DC
  Administered 2016-09-01 (×2): 7.5 mg via ORAL
  Filled 2016-08-31 (×3): qty 1

## 2016-08-31 MED ORDER — OXCARBAZEPINE 150 MG PO TABS
150.0000 mg | ORAL_TABLET | Freq: Two times a day (BID) | ORAL | Status: DC
  Administered 2016-08-31 – 2016-09-02 (×4): 150 mg via ORAL
  Filled 2016-08-31 (×5): qty 1

## 2016-08-31 MED ORDER — DIAZEPAM 2 MG PO TABS: 4.0000 mg | ORAL_TABLET | Freq: Every day | ORAL | Status: DC

## 2016-08-31 MED ORDER — PREDNISONE 20 MG PO TABS
40.0000 mg | ORAL_TABLET | Freq: Every day | ORAL | Status: DC
  Administered 2016-08-31 – 2016-09-02 (×2): 40 mg via ORAL
  Filled 2016-08-31 (×3): qty 2

## 2016-08-31 MED ORDER — IPRATROPIUM-ALBUTEROL 0.5-2.5 (3) MG/3ML IN SOLN: 3.0000 mL | RESPIRATORY_TRACT | Status: DC | PRN

## 2016-08-31 MED ORDER — ARMODAFINIL 250 MG PO TABS: 250.0000 mg | ORAL_TABLET | Freq: Every day | ORAL | Status: DC

## 2016-08-31 MED ORDER — SODIUM CHLORIDE 0.9 % IV SOLN
INTRAVENOUS | Status: AC
  Administered 2016-08-31: via INTRAVENOUS

## 2016-08-31 MED ORDER — PRAVASTATIN SODIUM 20 MG PO TABS
20.0000 mg | ORAL_TABLET | Freq: Every day | ORAL | Status: DC
  Administered 2016-09-01 (×2): 20 mg via ORAL
  Filled 2016-08-31 (×2): qty 1

## 2016-08-31 MED ORDER — ZONISAMIDE 100 MG PO CAPS
100.0000 mg | ORAL_CAPSULE | Freq: Every day | ORAL | Status: DC
  Administered 2016-09-01 – 2016-09-02 (×2): 100 mg via ORAL
  Filled 2016-08-31 (×2): qty 1

## 2016-08-31 MED ORDER — LEVOTHYROXINE SODIUM 75 MCG PO TABS
75.0000 ug | ORAL_TABLET | Freq: Every day | ORAL | Status: DC
  Administered 2016-09-01 – 2016-09-02 (×2): 75 ug via ORAL
  Filled 2016-08-31 (×2): qty 1

## 2016-08-31 MED ORDER — AMOXICILLIN-POT CLAVULANATE 875-125 MG PO TABS
1.0000 | ORAL_TABLET | Freq: Two times a day (BID) | ORAL | Status: DC
  Administered 2016-08-31 – 2016-09-02 (×4): 1 via ORAL
  Filled 2016-08-31 (×4): qty 1

## 2016-08-31 MED ORDER — PROPRANOLOL HCL ER 80 MG PO CP24
80.0000 mg | ORAL_CAPSULE | Freq: Every day | ORAL | Status: DC
  Administered 2016-09-01 – 2016-09-02 (×2): 80 mg via ORAL
  Filled 2016-08-31 (×2): qty 1

## 2016-08-31 MED ORDER — MODAFINIL 100 MG PO TABS
100.0000 mg | ORAL_TABLET | Freq: Every day | ORAL | Status: DC
  Administered 2016-09-01 – 2016-09-02 (×2): 100 mg via ORAL
  Filled 2016-08-31 (×2): qty 1

## 2016-08-31 MED ORDER — BREXPIPRAZOLE 3 MG PO TABS
3.0000 mg | ORAL_TABLET | Freq: Every day | ORAL | Status: DC
  Administered 2016-08-31 – 2016-09-01 (×2): 3 mg via ORAL
  Filled 2016-08-31 (×3): qty 1

## 2016-08-31 MED ORDER — FAMOTIDINE 20 MG PO TABS
20.0000 mg | ORAL_TABLET | Freq: Two times a day (BID) | ORAL | Status: DC
  Administered 2016-08-31 – 2016-09-02 (×4): 20 mg via ORAL
  Filled 2016-08-31 (×4): qty 1

## 2016-08-31 MED ORDER — OXYCODONE-ACETAMINOPHEN 7.5-325 MG PO TABS: 1.0000 | ORAL_TABLET | Freq: Three times a day (TID) | ORAL | Status: DC | PRN

## 2016-08-31 NOTE — ED Notes (Signed)
Report called and given to [place]

## 2016-08-31 NOTE — ED Triage Notes (Addendum)
Pt presents for evaluation of SOB from PCP. States PCP told her to come here for possible heart failure exacerbation after blood work this week. Pt was seen 12/12 and diagnosed with bronchitis. Pt AxO x4. Pt states it is painful to take a deep breath. Pt had brain stimulator placed last week.

## 2016-08-31 NOTE — ED Notes (Signed)
Pt and family made aware of bed assignment 

## 2016-08-31 NOTE — ED Provider Notes (Signed)
[facility] DEPT Provider Note   CSN: [id number] Arrival date & time: 08/31/16  1713     History   Chief Complaint Chief Complaint  Patient presents with  . Shortness of Breath    HPI [name] is a 45 y.o. female.  The history is provided by the patient and medical records.  Shortness of Breath  This is a recurrent (seen previously with similar, negative d-dimer at that time, notably has elevated pro-BNP 1299 in last 24 hours) problem. The average episode lasts 1 day. The current episode started 3 to 5 hours ago. The problem has not changed since onset.Associated symptoms include cough, chest pain (L sided, sharp, worsens with deep breathing) and leg swelling. Pertinent negatives include no fever, no rhinorrhea, no sputum production, no wheezing, no orthopnea, no vomiting and no rash. She has had prior ED visits (seen on 12/12 for similar, negative CXR, D-dimer negative. diagnosed with bronchitis and d/c home. ). Associated medical issues include asthma and recent surgery (recent peripheral nerve stimulator removal 9 days ago -- had this in for migraines). Associated medical issues do not include COPD, PE or DVT.    Past Medical History:  Diagnosis Date  . Anxiety   . Asthma   . Bell's palsy   . Bipolar disorder ([place])   . Cancer ([place])    skin  . Chronic headache disorder 03/08/2015  . Collapsed lung    right lung  . COPD (chronic obstructive pulmonary disease) ([place])   . Depression   . Dislocated shoulder   . Dyslipidemia   . Fatigue   . Fibromyalgia   . GERD (gastroesophageal reflux disease)   . Hypercholesteremia   . Migraine   . Nutcracker esophagus   . Obsessive-compulsive disorder   . OSA (obstructive sleep apnea)   . 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 Sleep apnea   . Thyroid disease     Patient Active Problem List   Diagnosis Date Noted  . Dyspnea 08/31/2016  . AKI (acute kidney injury) ([place]) 08/31/2016  . Chest pain 08/31/2016  . Chronic  headache disorder 03/08/2015  . Chest pain syndrome 11/18/2014  . Dyslipidemia 11/18/2014  . Sleep apnea with use of continuous positive airway pressure (CPAP) 09/03/2013  . 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 Contusion of face, scalp, and neck except eye(s) 11/08/2012  . Mechanical complication of nervous system device, implant, and graft 11/08/2012  . Obstructive sleep apnea 11/08/2012  . Fibromyalgia 12/25/2011  . Depression, major 12/25/2011  . Bell's palsy 12/25/2011  . Chronic pain 12/25/2011  . GERD (gastroesophageal reflux disease) 12/25/2011    Past Surgical History:  Procedure Laterality Date  . ABDOMINAL HYSTERECTOMY    . ans     2001, 2010  . ANS unit     . CESAREAN SECTION    . CESAREAN SECTION     2007  . DILATION AND CURETTAGE OF UTERUS     2007  . DNC    . ESOPHAGEAL MANOMETRY N/A 04/29/2015   Procedure: ESOPHAGEAL MANOMETRY (EM);  Surgeon: Arta Silence, MD;  Location: [facility];  Service: Endoscopy;  Laterality: N/A;  . gallbladder removed    . GALLBLADDER SURGERY     1998  . stretching of esophagus    . Tens unit     04/2014    OB History    No data available       Home Medications    Prior to Admission medications   Medication Sig  Start Date End Date Taking? Authorizing Provider  acyclovir (ZOVIRAX) 400 MG tablet TAKE 1 TABLET BY MOUTH DAILY 09/27/14  Yes Historical Provider, MD  albuterol (PROVENTIL HFA;VENTOLIN HFA) 108 (90 BASE) MCG/ACT inhaler Inhale 1 puff into the lungs every 6 (six) hours as needed for wheezing or shortness of breath.   Yes Historical Provider, MD  amoxicillin-clavulanate (AUGMENTIN) 875-125 MG tablet Take 1 tablet by mouth every 12 (twelve) hours. 08/28/16  Yes Quintella Reichert, MD  Armodafinil (NUVIGIL) 250 MG tablet Take 250 mg by mouth daily.   Yes Historical Provider, MD  benzonatate (TESSALON) 100 MG capsule Take 100 mg by mouth 3 (three) times daily as needed for cough.   Yes Historical Provider, MD  Biotin  5000 MCG CAPS Take 10,000 mcg by mouth 2 (two) times daily.    Yes Historical Provider, MD  Brexpiprazole (REXULTI) 3 MG TABS Take 3 mg by mouth at bedtime.   Yes Historical Provider, MD  budesonide-formoterol (SYMBICORT) 80-4.5 MCG/ACT inhaler Inhale 2 puffs into the lungs 2 (two) times daily.   Yes Historical Provider, MD  candesartan (ATACAND) 4 MG tablet Take 8 mg by mouth daily. 08/14/16  Yes Historical Provider, MD  Carboxymethylcellulose Sodium (REFRESH LIQUIGEL OP) Apply 1 drop to eye every 4 (four) hours as needed (dryness).    Yes Historical Provider, MD  cetirizine (ZYRTEC) 10 MG tablet Take 10 mg by mouth daily.   Yes Historical Provider, MD  dexmethylphenidate (FOCALIN) 10 MG tablet Take 10 mg by mouth 3 (three) times daily.   Yes Historical Provider, MD  diazepam (VALIUM) 2 MG tablet Take 4 mg by mouth at bedtime.    Yes Historical Provider, MD  fenofibrate (TRICOR) 145 MG tablet Take 145 mg by mouth at bedtime.    Yes Historical Provider, MD  fluticasone (FLONASE) 50 MCG/ACT nasal spray Place 2 sprays into the nose daily as needed for allergies.    Yes Historical Provider, MD  gabapentin (NEURONTIN) 600 MG tablet Take 600 mg by mouth 4 (four) times daily.   Yes Historical Provider, MD  lamoTRIgine (LAMICTAL) 200 MG tablet Take 200 mg by mouth at bedtime.    Yes Historical Provider, MD  levothyroxine (SYNTHROID, LEVOTHROID) 75 MCG tablet Take 75 mcg by mouth daily before breakfast.   Yes Historical Provider, MD  meloxicam (MOBIC) 15 MG tablet Take 15 mg by mouth daily as needed for pain.    Yes Historical Provider, MD  metaxalone (SKELAXIN) 800 MG tablet Take 800 mg by mouth 3 (three) times daily as needed for muscle spasms.    Yes Historical Provider, MD  Multiple Vitamin (MULTIVITAMIN WITH MINERALS) TABS tablet Take 1 tablet by mouth daily.   Yes Historical Provider, MD  oxcarbazepine (TRILEPTAL) 600 MG tablet Take 600 mg by mouth 2 (two) times daily. 08/14/16 08/14/17 Yes Historical  Provider, MD  oxyCODONE-acetaminophen (PERCOCET) 7.5-325 MG tablet Take 1 tablet by mouth 2 times daily at 12 noon and 4 pm.    Yes Historical Provider, MD  pravastatin (PRAVACHOL) 20 MG tablet Take 20 mg by mouth at bedtime.    Yes Historical Provider, MD  propranolol ER (INDERAL LA) 80 MG 24 hr capsule Take 80 mg by mouth daily.   Yes Historical Provider, MD  senna (SENOKOT) 8.6 MG TABS tablet Take 3 tablets by mouth 2 (two) times daily.    Yes Historical Provider, MD  trospium (SANCTURA) 20 MG tablet Take 20 mg by mouth 2 (two) times daily.   Yes Historical Provider, MD  urea (CARMOL) 40 % CREA Apply 1 application topically at bedtime.   Yes Historical Provider, MD  venlafaxine XR (EFFEXOR-XR) 150 MG 24 hr capsule Take 300 mg by mouth daily with breakfast.   Yes Historical Provider, MD    Family History Family History  Problem Relation Age of Onset  . Adopted: Yes  . Depression Mother   . Dementia Mother   . Depression Maternal Grandmother   . Diabetes Father     Social History Social History  Substance Use Topics  . Smoking status: Former Smoker    Quit date: 06/25/2000  . Smokeless tobacco: Never Used  . Alcohol use No     Allergies   Dilaudid [hydromorphone hcl]; Iodinated diagnostic agents; Ketorolac tromethamine; Nsaids; Salicylates; and Tape   Review of Systems Review of Systems  Constitutional: Positive for chills. Negative for fever.  HENT: Positive for congestion. Negative for rhinorrhea.   Respiratory: Positive for cough and shortness of breath. Negative for sputum production and wheezing.   Cardiovascular: Positive for chest pain (L sided, sharp, worsens with deep breathing) and leg swelling. Negative for orthopnea.  Gastrointestinal: Positive for nausea. Negative for vomiting.  Genitourinary: Negative for dysuria and hematuria.  Skin: Positive for wound (surgical wounds from stimulator removal). Negative for pallor and rash.  Neurological: Negative for  weakness and numbness.  Psychiatric/Behavioral: Negative for agitation and confusion.  All other systems reviewed and are negative.    Physical Exam Updated Vital Signs BP 123/71   Pulse 81   Temp 97.8 F (36.6 C) (Oral)   Resp 18   SpO2 92%   Physical Exam  Constitutional: She is oriented to person, place, and time. No distress.  Playing solitaire on cell phone during history and exam  HENT:  Head: Normocephalic and atraumatic.  Eyes: Conjunctivae are normal. Pupils are equal, round, and reactive to light.  Neck: Normal range of motion. Neck supple.  Cardiovascular: Normal rate and regular rhythm.   Pulmonary/Chest: Breath sounds normal. She is in respiratory distress (mild tachypnea). She has no wheezes.  Abdominal: Soft. She exhibits no distension.  Musculoskeletal: Normal range of motion. She exhibits edema (pitting edema up to mid ankle bilaterally).  Neurological: She is alert and oriented to person, place, and time.  Skin: Skin is warm and dry. She is not diaphoretic.  Nursing note and vitals reviewed.    ED Treatments / Results  Labs (all labs ordered are listed, but only abnormal results are displayed) Labs Reviewed  BASIC METABOLIC PANEL - Abnormal; Notable for the following:       Result Value   Chloride 98 (*)    Glucose, Bld 145 (*)    BUN 22 (*)    Creatinine, Ser 1.27 (*)    GFR calc non Af Amer 51 (*)    GFR calc Af Amer 59 (*)    All other components within normal limits  CBC - Abnormal; Notable for the following:    Hemoglobin 10.6 (*)    HCT 33.7 (*)    All other components within normal limits  BRAIN NATRIURETIC PEPTIDE - Abnormal; Notable for the following:    B Natriuretic Peptide 323.9 (*)    All other components within normal limits  DIFFERENTIAL  TROPONIN I  TROPONIN I  TROPONIN I  CBC  BASIC METABOLIC PANEL  Randolm Idol, ED    EKG  EKG Interpretation  Date/Time:  Friday August 31 2016 17:17:58 EST Ventricular Rate:   76 PR Interval:  150 QRS  Duration: 86 QT Interval:  380 QTC Calculation: 427 R Axis:   76 Text Interpretation:  Normal sinus rhythm Low voltage QRS Borderline ECG When compared with ECG of 08/28/2016, No significant change was found Confirmed by [facility]  MD, DAVID ([id number]) on 08/31/2016 6:53:03 PM       Radiology Dg Chest 2 View  Result Date: 08/31/2016 CLINICAL DATA:  Shortness of breath EXAM: CHEST  2 VIEW COMPARISON:  08/28/2016, 10/10/2015 FINDINGS: Cutaneous staples over the right upper chest wall. Low lung volumes with bibasilar atelectasis. No acute consolidation or effusion. Heart size upper normal. No overt pulmonary edema. No pneumothorax. IMPRESSION: 1. Low lung volumes with streaky bibasilar atelectasis 2. No acute consolidation or overt pulmonary edema Electronically Signed   By: Donavan Foil M.D.   On: 08/31/2016 18:25    Procedures Procedures (including critical care time)  Medications Ordered in ED Medications  amoxicillin-clavulanate (AUGMENTIN) 875-125 MG per tablet 1 tablet (not administered)  OXcarbazepine (TRILEPTAL) tablet 150 mg (not administered)  propranolol ER (INDERAL LA) 24 hr capsule 80 mg (not administered)  Brexpiprazole TABS 3 mg (not administered)  zonisamide (ZONEGRAN) capsule 100 mg (not administered)  gabapentin (NEURONTIN) tablet 600 mg (not administered)  diazepam (VALIUM) tablet 4 mg (not administered)  levothyroxine (SYNTHROID, LEVOTHROID) tablet 75 mcg (not administered)  darifenacin (ENABLEX) 24 hr tablet 7.5 mg (not administered)  venlafaxine XR (EFFEXOR-XR) 24 hr capsule 300 mg (not administered)  fenofibrate tablet 160 mg (not administered)  acyclovir (ZOVIRAX) tablet 400 mg (not administered)  lamoTRIgine (LAMICTAL) tablet 200 mg (not administered)  Armodafinil 250 mg (not administered)  pravastatin (PRAVACHOL) tablet 20 mg (not administered)  acetaminophen (TYLENOL) tablet 650 mg (not administered)  ondansetron (ZOFRAN) injection 4  mg (not administered)  predniSONE (DELTASONE) tablet 40 mg (not administered)  0.9 %  sodium chloride infusion (not administered)  ipratropium-albuterol (DUONEB) 0.5-2.5 (3) MG/3ML nebulizer solution 3 mL (not administered)  famotidine (PEPCID) tablet 20 mg (not administered)  oxyCODONE-acetaminophen (PERCOCET/ROXICET) 5-325 MG per tablet 1 tablet (not administered)    And  oxyCODONE (Oxy IR/ROXICODONE) immediate release tablet 2.5 mg (not administered)     Initial Impression / Assessment and Plan / ED Course  I have reviewed the triage vital signs and the nursing notes.  Pertinent labs & imaging results that were available during my care of the patient were reviewed by me and considered in my medical decision making (see chart for details).  Clinical Course      Patient is a 45 year old female who presents today with worsening dyspnea. Patient states she was told that she has heart failure although she has no previous echo to confirm this. She does have a recently documented pro BNP that was elevated at 1299. Today her BNP was elevated to the 300s. I have a clear reason to explain why the patient would have heart failure especially at her age. Given that this is fairly new in onset, patient would be appropriate for admission for further workup of this. 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 Patient is on Lasix and it is unclear if she is intravascularly depleted and third spacing and therefore having an AKA I secondary to this. Discussed with internal medicine who will evaluate the patient and admit for further evaluation and management. Patient is stable at the time of admission.  Final Clinical Impressions(s) / ED Diagnoses   Final diagnoses:  Dyspnea, unspecified type  Elevated brain natriuretic peptide (BNP) level    New Prescriptions  Current Discharge Medication List       Theodosia Quay, MD [id number]    Delora Fuel, MD [id number]

## 2016-08-31 NOTE — H&P (Signed)
History and Physical    [name] [id number] DOB: [birth year] DOA: 08/31/2016  PCP: Berkley Harvey, NP [facility])  Patient coming from: HOME  Chief Complaint: Atypical chest pain, shortness of breath  HPI: [name] is a 45 y.o. woman with multiple comorbidities including Bipolar disorder with schizophrenia, chronic pain, fibromyalgia, migraines, OSA on CPAP, COPD (patient is not familiar with this diagnosis; she is not followed by a pulmonologist), Bell's palsy with right sided facial paralysis, GERD, nutcracker esophagus, and hypothyroidism who presents to the ED for evaluation of persistent shortness of breath, atypical chest pain, and swelling in her legs.  She reports that symptoms started after have a nerve stimulator removed at [facility] just over one week ago.  She saw her PCP at [facility] on 12/11 and was diagnosed with bronchitis.  She received a single injection of solumedrol and a nebulizer treatment.  She received prescriptions for Tessalon perles and a Breo inhaler.  She was then seen in our ED for similar complaints on 12/12 and was diagnosed with acute sinusitis.  She received a prescription for Augmentin.  She went back to her PCP on 12/14.  She was found to have an elevated BNP.  She was advised to increase her oral lasix dose to 20mg  twice daily (she says that she had been on once daily dosing for about one month prior) and she was referred for outpatient cardiology follow-up.  Apparently, she is in the ED tonight at the urging of her husband.  She complains of right side chest tightness that is worse with inspiration.  She says that she is short of breath at rest and has DOE.  No orthopnea.  She has had swelling in her legs.  She has a nonproductive cough.  She has been light-headed but no syncope.  She has had nausea but no vomiting.  She has intermittent loose stools (but I notice multiple stool softeners/laxatives on her home med list).  No watery diarrhea.  She denies any recent  viral illnesses.  ED Course: BNP here 323.  Troponin negative.  EKG shows a NSR without acute ST changes.  Chest xray negative for acute process.  She does not have an oxygen requirement.  Labs suggest AKI.  ED attending worried about acute CHF and has requested observation.  Of note, the patient had a negative D-Dimer on 08/28/16.  Review of Systems: As per HPI otherwise 10 point review of systems negative.    Past Medical History:  Diagnosis Date  . Anxiety   . Asthma   . Bell's palsy   . Bipolar disorder ([place])   . Cancer ([place])    skin  . Chronic headache disorder 03/08/2015  . Collapsed lung    right lung  . COPD (chronic obstructive pulmonary disease) ([place])   . Depression   . Dislocated shoulder   . Dyslipidemia   . Fatigue   . Fibromyalgia   . GERD (gastroesophageal reflux disease)   . Hypercholesteremia   . Migraine   . Nutcracker esophagus   . Obsessive-compulsive disorder   . OSA (obstructive sleep apnea)   . 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 Sleep apnea   . Thyroid disease     Past Surgical History:  Procedure Laterality Date  . ABDOMINAL HYSTERECTOMY    . ans     2001, 2010  . ANS unit     . CESAREAN SECTION    . CESAREAN SECTION     2007  .  DILATION AND CURETTAGE OF UTERUS     2007  . DNC    . ESOPHAGEAL MANOMETRY N/A 04/29/2015   Procedure: ESOPHAGEAL MANOMETRY (EM);  Surgeon: Arta Silence, MD;  Location: [facility];  Service: Endoscopy;  Laterality: N/A;  . gallbladder removed    . GALLBLADDER SURGERY     1998  . stretching of esophagus    . Tens unit     04/2014     reports that she quit smoking about 16 years ago. She has never used smokeless tobacco. She reports that she does not drink alcohol or use drugs. She is married.  She has one daughter.  Allergies  Allergen Reactions  . Dilaudid [Hydromorphone Hcl] Itching  . Iodinated Diagnostic Agents Other (See Comments)    unknown  . Ketorolac Tromethamine Swelling  . Nsaids      Can't take for fibromyalgia   . Salicylates Other (See Comments)    unknown  . Tape Other (See Comments)    Blisters - any type of tape and Band-Aids     Family History  Problem Relation Age of Onset  . Adopted: Yes  . Depression Mother   . Dementia Mother   . Depression Maternal Grandmother   . Diabetes Father      Prior to Admission medications   Medication Sig Start Date End Date Taking? Authorizing Provider  acyclovir (ZOVIRAX) 400 MG tablet TAKE 1 TABLET BY MOUTH DAILY 09/27/14   Historical Provider, MD  albuterol (PROVENTIL HFA;VENTOLIN HFA) 108 (90 BASE) MCG/ACT inhaler Inhale 1 puff into the lungs every 6 (six) hours as needed for wheezing or shortness of breath.    Historical Provider, MD  amoxicillin-clavulanate (AUGMENTIN) 875-125 MG tablet Take 1 tablet by mouth every 12 (twelve) hours. 08/28/16   Quintella Reichert, MD  Armodafinil (NUVIGIL) 250 MG tablet Take 250 mg by mouth daily.    Historical Provider, MD  benzonatate (TESSALON) 100 MG capsule Take 100 mg by mouth 3 (three) times daily as needed for cough.    Historical Provider, MD  Biotin 5000 MCG CAPS Take 10,000 mcg by mouth 2 (two) times daily.     Historical Provider, MD  budesonide-formoterol (SYMBICORT) 80-4.5 MCG/ACT inhaler Inhale 2 puffs into the lungs 2 (two) times daily.    Historical Provider, MD  Carboxymethylcellulose Sodium (REFRESH LIQUIGEL OP) Apply 1 drop to eye every 4 (four) hours as needed (dryness).     Historical Provider, MD  cetirizine (ZYRTEC) 10 MG tablet Take 10 mg by mouth daily.    Historical Provider, MD  dexmethylphenidate (FOCALIN) 10 MG tablet Take 10 mg by mouth 3 (three) times daily.    Historical Provider, MD  diazepam (VALIUM) 2 MG tablet Take 4 mg by mouth at bedtime.     Historical Provider, MD  fenofibrate (TRICOR) 145 MG tablet Take 145 mg by mouth at bedtime.     Historical Provider, MD  fluticasone (FLONASE) 50 MCG/ACT nasal spray Place 2 sprays into the nose daily as needed  for allergies.     Historical Provider, MD  gabapentin (NEURONTIN) 600 MG tablet Take 600 mg by mouth 4 (four) times daily.    Historical Provider, MD  HYDROcodone-acetaminophen (NORCO) 7.5-325 MG tablet Take 1 tablet by mouth 3 (three) times daily.     Historical Provider, MD  lamoTRIgine (LAMICTAL) 200 MG tablet Take 200 mg by mouth at bedtime.     Historical Provider, MD  levothyroxine (SYNTHROID, LEVOTHROID) 75 MCG tablet Take 75 mcg by mouth daily  before breakfast.    Historical Provider, MD  meloxicam (MOBIC) 15 MG tablet Take 15 mg by mouth daily as needed for pain.     Historical Provider, MD  metaxalone (SKELAXIN) 800 MG tablet Take 800 mg by mouth 3 (three) times daily as needed for muscle spasms.     Historical Provider, MD  Multiple Vitamin (MULTIVITAMIN WITH MINERALS) TABS tablet Take 1 tablet by mouth daily.    Historical Provider, MD  naproxen (NAPROSYN) 500 MG tablet Take 1 tablet (500 mg total) by mouth 2 (two) times daily with a meal. 08/23/16   Margarita Mail, PA-C  OXcarbazepine (TRILEPTAL) 150 MG tablet Take 150 mg by mouth 2 (two) times daily.    Historical Provider, MD  oxyCODONE-acetaminophen (PERCOCET) 7.5-325 MG tablet Take 1 tablet by mouth every 8 (eight) hours as needed for severe pain.    Historical Provider, MD  polyethylene glycol (MIRALAX / GLYCOLAX) packet Take 17 g by mouth daily as needed for mild constipation.     Historical Provider, MD  pravastatin (PRAVACHOL) 20 MG tablet Take 20 mg by mouth at bedtime.     Historical Provider, MD  propranolol ER (INDERAL LA) 80 MG 24 hr capsule TK ONE C PO D 02/24/16   Historical Provider, MD  REXULTI 3 MG TABS TK 1 T PO HS 02/20/16   Historical Provider, MD  senna (SENOKOT) 8.6 MG TABS tablet Take 3 tablets by mouth 2 (two) times daily.     Historical Provider, MD  trospium (SANCTURA) 20 MG tablet Take 20 mg by mouth 2 (two) times daily.    Historical Provider, MD  UNABLE TO FIND Take 1 Applicatorful by mouth 2 (two) times daily.  Med Name: Lidocaine- antiacid solution    Historical Provider, MD  urea (CARMOL) 40 % CREA Apply 1 application topically at bedtime.    Historical Provider, MD  venlafaxine XR (EFFEXOR-XR) 150 MG 24 hr capsule Take 300 mg by mouth daily with breakfast.    Historical Provider, MD  zonisamide (ZONEGRAN) 100 MG capsule  03/15/16   Historical Provider, MD    Physical Exam: Vitals:   08/31/16 2115 08/31/16 2130 08/31/16 2144 08/31/16 2145  BP: 117/68 134/76 134/76 123/71  Pulse: 71 77 73 81  Resp: 13 25 22 18   Temp:      TempSrc:      SpO2: 93% 98% 99% 92%      Constitutional: NAD, calm, comfortable, NONtoxic appearing, she is not acutely decompensating Vitals:   08/31/16 2115 08/31/16 2130 08/31/16 2144 08/31/16 2145  BP: 117/68 134/76 134/76 123/71  Pulse: 71 77 73 81  Resp: 13 25 22 18   Temp:      TempSrc:      SpO2: 93% 98% 99% 92%   Eyes: PERRL, lids and conjunctivae normal ENMT: Mucous membranes are moist. Posterior pharynx clear of any exudate or lesions. Normal dentition.  Neck: normal appearance, supple Respiratory: clear to auscultation bilaterally, no wheezing, no crackles. Normal respiratory effort. No accessory muscle use.  Cardiovascular: Normal rate, regular rhythm, no murmurs / rubs / gallops. Trace to 1+ pretibial edema bilaterally.  2+ pedal pulses. GI: abdomen is soft and compressible.  No distention.  No tenderness.  Bowel sounds are present. Musculoskeletal:  No joint deformity in upper and lower extremities. Good ROM, no contractures. Normal muscle tone.  Skin: no rashes, warm and dry Neurologic: Chronic right facial droop.  Otherwise, no focal deficits. Psychiatric: Normal judgment and insight. Alert and oriented x 3. Normal  mood.     Labs on Admission: I have personally reviewed following labs and imaging studies  CBC:  Recent Labs Lab 08/28/16 1040 08/31/16 1738 08/31/16 2038  WBC 4.3 5.2  --   NEUTROABS 2.5  --  3.5  HGB 10.2* 10.6*  --   HCT  31.7* 33.7*  --   MCV 86.1 86.2  --   PLT 214 260  --    Basic Metabolic Panel:  Recent Labs Lab 08/28/16 1040 08/31/16 1738  NA 137 139  K 3.9 3.8  CL 102 98*  CO2 27 29  GLUCOSE 122* 145*  BUN 16 22*  CREATININE 0.99 1.27*  CALCIUM 9.2 9.4   GFR: CrCl cannot be calculated (Unknown ideal weight.). Liver Function Tests:  Recent Labs Lab 08/28/16 1040  AST 58*  ALT 62*  ALKPHOS 58  BILITOT 0.3  PROT 6.1*  ALBUMIN 4.1   Radiological Exams on Admission: Dg Chest 2 View  Result Date: 08/31/2016 CLINICAL DATA:  Shortness of breath EXAM: CHEST  2 VIEW COMPARISON:  08/28/2016, 10/10/2015 FINDINGS: Cutaneous staples over the right upper chest wall. Low lung volumes with bibasilar atelectasis. No acute consolidation or effusion. Heart size upper normal. No overt pulmonary edema. No pneumothorax. IMPRESSION: 1. Low lung volumes with streaky bibasilar atelectasis 2. No acute consolidation or overt pulmonary edema Electronically Signed   By: Donavan Foil M.D.   On: 08/31/2016 18:25    EKG: Independently reviewed. NSR.  No acute ST segment changes.  Assessment/Plan Principal Problem:   Dyspnea Active Problems:   Bell's palsy   Chronic pain   GERD (gastroesophageal reflux disease)   Obstructive sleep apnea   AKI (acute kidney injury) (HCC)   Chest pain      Atypical chest pain, dyspnea.  Trace pretibial edema.  Etiology of her symptoms unclear but differential includes bronchitis/COPD exacerbation, cardiac (ACS, CHF, pericarditis), muscle spasm, esophageal spasm.  At this point, I am not convinced that she has ACS or acute CHF.  BNP is equivocal, chest xray does not show pulmonary edema, and leg swelling is mild to me.  I think acute PE is unlikely with documented negative D-Dimer.  At this point, I favor bronchitis, and I actually think the patient is dry and needs fluids (not further diuresis at this point). --Agree with observation --Serial troponin (expect to be  negative) --Complete echo in the AM --Patient has ASA and toradol allergies --Start prednisone 40mg  daily, 1st dose now. --Duonebs prn --Continue Augmentin for recent diagnosis of sinusitis  AKI --NS at 75cc/hr x 10 hrs.  Repeat BMP in the AM.    GERD --PPI BID --Soft diet  OSA --CPAP  Bell's palsy --Acyclovir  Bipolar/schizophrenia --Continue home meds  Hypothyroidsim --Continue home dose of levothyroxine  Chronic pain --Neurontin, oxycodone   DVT prophylaxis: Low risk, outpatient status Code Status: FULL Family Communication: Patient's husband present at bedside in the ED at the time of admission. Disposition Plan: Expect she will go home at discharge. Consults called: NONE Admission status: Place in observation with telemetry monitoring and continuous pulse oximetry.   TIME SPENT: 70 minutes   Eber Jones MD [facility] Pager [phone]  If 7PM-7AM, please contact night-coverage [email or web address] Password [facility]  08/31/2016, 9:54 PM

## 2016-09-01 ENCOUNTER — Encounter (HOSPITAL_COMMUNITY): Payer: Self-pay | Admitting: *Deleted

## 2016-09-01 ENCOUNTER — Observation Stay (HOSPITAL_COMMUNITY): Payer: 59

## 2016-09-01 DIAGNOSIS — G894 Chronic pain syndrome: Secondary | ICD-10-CM

## 2016-09-01 DIAGNOSIS — R0609 Other forms of dyspnea: Secondary | ICD-10-CM | POA: Diagnosis not present

## 2016-09-01 DIAGNOSIS — R079 Chest pain, unspecified: Secondary | ICD-10-CM

## 2016-09-01 DIAGNOSIS — G4733 Obstructive sleep apnea (adult) (pediatric): Secondary | ICD-10-CM

## 2016-09-01 DIAGNOSIS — K219 Gastro-esophageal reflux disease without esophagitis: Secondary | ICD-10-CM

## 2016-09-01 DIAGNOSIS — N179 Acute kidney failure, unspecified: Secondary | ICD-10-CM

## 2016-09-01 DIAGNOSIS — G510 Bell's palsy: Secondary | ICD-10-CM | POA: Diagnosis not present

## 2016-09-01 LAB — BASIC METABOLIC PANEL
Anion gap: 7 (ref 5–15)
BUN: 16 mg/dL (ref 6–20)
CO2: 26 mmol/L (ref 22–32)
Calcium: 9.2 mg/dL (ref 8.9–10.3)
Chloride: 103 mmol/L (ref 101–111)
Creatinine, Ser: 1.05 mg/dL — ABNORMAL HIGH (ref 0.44–1.00)
GFR calc Af Amer: >60 mL/min (ref >60)
GLUCOSE: 165 mg/dL — AB (ref 65–99)
POTASSIUM: 4.4 mmol/L (ref 3.5–5.1)
Sodium: 136 mmol/L (ref 135–145)

## 2016-09-01 LAB — ECHOCARDIOGRAM COMPLETE
Height: 63.5 in
WEIGHTICAEL: 3785.6 [oz_av]

## 2016-09-01 LAB — TROPONIN I: Troponin I: <0.03 ng/mL (ref <0.03)

## 2016-09-01 LAB — CBC
HCT: 34.1 % — ABNORMAL LOW (ref 36.0–46.0)
Hemoglobin: 10.6 g/dL — ABNORMAL LOW (ref 12.0–15.0)
MCH: 27.4 pg (ref 26.0–34.0)
MCHC: 31.1 g/dL (ref 30.0–36.0)
MCV: 88.1 fL (ref 78.0–100.0)
PLATELETS: 242 10*3/uL (ref 150–400)
RBC: 3.87 MIL/uL (ref 3.87–5.11)
RDW: 13.7 % (ref 11.5–15.5)
WBC: 5.5 10*3/uL (ref 4.0–10.5)

## 2016-09-01 LAB — D-DIMER, QUANTITATIVE: D-Dimer, Quant: 0.27 ug/mL-FEU (ref 0.00–0.50)

## 2016-09-01 MED ORDER — OXYCODONE-ACETAMINOPHEN 5-325 MG PO TABS: 1.0000 | ORAL_TABLET | Freq: Three times a day (TID) | ORAL | Status: DC | PRN

## 2016-09-01 MED ORDER — ENOXAPARIN SODIUM 60 MG/0.6ML ~~LOC~~ SOLN
50.0000 mg | SUBCUTANEOUS | Status: DC
  Administered 2016-09-01 – 2016-09-02 (×2): 50 mg via SUBCUTANEOUS
  Filled 2016-09-01 (×2): qty 0.6

## 2016-09-01 MED ORDER — OXYCODONE HCL 5 MG PO TABS: 2.5000 mg | ORAL_TABLET | Freq: Three times a day (TID) | ORAL | Status: DC | PRN

## 2016-09-01 MED ORDER — OXYCODONE-ACETAMINOPHEN 5-325 MG PO TABS
1.0000 | ORAL_TABLET | Freq: Three times a day (TID) | ORAL | Status: DC | PRN
  Administered 2016-09-01 – 2016-09-02 (×3): 1 via ORAL
  Filled 2016-09-01 (×2): qty 1

## 2016-09-01 MED ORDER — IPRATROPIUM-ALBUTEROL 0.5-2.5 (3) MG/3ML IN SOLN
3.0000 mL | RESPIRATORY_TRACT | Status: DC
  Administered 2016-09-01: 3 mL via RESPIRATORY_TRACT
  Filled 2016-09-01: qty 3

## 2016-09-01 MED ORDER — IPRATROPIUM-ALBUTEROL 0.5-2.5 (3) MG/3ML IN SOLN
3.0000 mL | Freq: Two times a day (BID) | RESPIRATORY_TRACT | Status: DC
  Administered 2016-09-01 – 2016-09-02 (×2): 3 mL via RESPIRATORY_TRACT
  Filled 2016-09-01 (×2): qty 3

## 2016-09-01 MED ORDER — OXYCODONE HCL 5 MG PO TABS
2.5000 mg | ORAL_TABLET | Freq: Three times a day (TID) | ORAL | Status: DC | PRN
  Administered 2016-09-01 – 2016-09-02 (×3): 2.5 mg via ORAL
  Filled 2016-09-01 (×2): qty 1

## 2016-09-01 NOTE — Progress Notes (Signed)
PROGRESS NOTE    [name]  [id number] DOB: 21-Oct-1970 DOA: 08/31/2016 PCP: Berkley Harvey, NP    Brief Narrative:  [name] is a 45 y.o. woman with multiple comorbidities including Bipolar disorder with schizophrenia, chronic pain, fibromyalgia, migraines, OSA on CPAP, COPD (patient is not familiar with this diagnosis; she is not followed by a pulmonologist), Bell's palsy with right sided facial paralysis, GERD, nutcracker esophagus, and hypothyroidism who presents to the ED for evaluation of persistent shortness of breath, atypical chest pain, and swelling in her legs.  She reports that symptoms started after have a nerve stimulator removed at [facility] just over one week ago.  She saw her PCP at [facility] on 12/11 and was diagnosed with bronchitis.  She received a single injection of solumedrol and a nebulizer treatment.  She received prescriptions for Tessalon perles and a Breo inhaler.  She was then seen in our ED for similar complaints on 12/12 and was diagnosed with acute sinusitis.  She received a prescription for Augmentin.  She went back to her PCP on 12/14.  She was found to have an elevated BNP.  She was advised to increase her oral lasix dose to 20mg  twice daily (she says that she had been on once daily dosing for about one month prior) and she was referred for outpatient cardiology follow-up.  Apparently, she is in the ED tonight at the urging of her husband.  She complains of right side chest tightness that is worse with inspiration.  She says that she is short of breath at rest and has DOE.  No orthopnea.  She has had swelling in her legs.  She has a nonproductive cough.  She has been light-headed but no syncope.  She has had nausea but no vomiting.  She has intermittent loose stools (but I notice multiple stool softeners/laxatives on her home med list).  No watery diarrhea.  She denies any recent viral illnesses.   Assessment & Plan:   Principal Problem:   Dyspnea Active  Problems:   Bell's palsy   Chronic pain   GERD (gastroesophageal reflux disease)   Obstructive sleep apnea   AKI (acute kidney injury) (HCC)   Chest pain   Atypical chest pain, dyspnea - BNP is equivocal - chest xray does not show pulmonary edema - D-Dimer negative on 12/12 - will repeat D- Dimer and then consider ordering CT scan - EKG WNL --Serial troponin negative -- Echocardiogram: Left ventricle: The cavity size was normal. Systolic function was normal. The estimated ejection fraction was in the range of 55% to 60%. Wall motion was normal; there were no regional wall   motion abnormalities. Mitral valve: There was mild regurgitation. Left atrium: The atrium was moderately to severely dilated -- Continue prednisone 40mg  daily. --Duonebs q4h --Continue Augmentin for recent diagnosis of sinusitis  AKI - Cr improved this am    GERD --PPI BID --Soft diet  OSA --CPAP  Bell's palsy --Acyclovir  Bipolar/schizophrenia --Continue home meds  Hypothyroidsim --Continue home dose of levothyroxine  Chronic pain --Neurontin, oxycodone - increased oxycodone frequency secondary to increase in pain today   DVT prophylaxis: Lovenox Code Status: FULL Family Communication: No family present bedside. Disposition Plan: Expect she will go home at discharge   Consultants:   None  Procedures:   Echocardiogram on 12/16  Antimicrobials:   Augmentin (started prior to hospitalization)   Subjective: Patient is voicing she is still experiencing significant chest tightness- says breathing treatment only mildly improved this.  Troponins negative, BNP elevated, EKG normal.  Will repeat D- dimer and then consider Chest CT pending that result.    Objective: Vitals:   08/31/16 2300 09/01/16 0759 09/01/16 1224 09/01/16 1230  BP: 119/66 118/65  115/64  Pulse: 73 72  72  Resp: 20 16  18   Temp: 98.3 F (36.8 C) 98.2 F (36.8 C)  98.7 F (37.1 C)  TempSrc: Oral Oral   Oral  SpO2: 98% 95% 94% 93%  Weight: 107.3 kg (236 lb 9.6 oz)     Height: 5' 3.5" (1.613 m)       Intake/Output Summary (Last 24 hours) at 09/01/16 1502 Last data filed at 09/01/16 0900  Gross per 24 hour  Intake              480 ml  Output              875 ml  Net             -395 ml   Filed Weights   08/31/16 2300  Weight: 107.3 kg (236 lb 9.6 oz)    Examination:  General exam: Appears calm and comfortable  Respiratory system: Clear to auscultation. Respiratory effort normal. Cardiovascular system: S1 & S2 heard, RRR. No JVD, murmurs, rubs, gallops or clicks. Slight pedal edema bilaterally Gastrointestinal system: Abdomen is nondistended, soft and nontender. No organomegaly or masses felt. Normal bowel sounds heard. Central nervous system: Alert and oriented. No focal neurological deficits. Extremities: Symmetric 5 x 5 power. Skin: No rashes, lesions or ulcers Psychiatry: Judgement and insight appear normal. Mood & affect appropriate.     Data Reviewed: I have personally reviewed following labs and imaging studies  CBC:  Recent Labs Lab 08/28/16 1040 08/31/16 1738 08/31/16 2038 09/01/16 0640  WBC 4.3 5.2  --  5.5  NEUTROABS 2.5  --  3.5  --   HGB 10.2* 10.6*  --  10.6*  HCT 31.7* 33.7*  --  34.1*  MCV 86.1 86.2  --  88.1  PLT 214 260  --  [id number]   Basic Metabolic Panel:  Recent Labs Lab 08/28/16 1040 08/31/16 1738 09/01/16 0640  NA 137 139 136  K 3.9 3.8 4.4  CL 102 98* 103  CO2 27 29 26   GLUCOSE 122* 145* 165*  BUN 16 22* 16  CREATININE 0.99 1.27* 1.05*  CALCIUM 9.2 9.4 9.2   GFR: Estimated Creatinine Clearance: 81.9 mL/min (by C-G formula based on SCr of 1.05 mg/dL (H)). Liver Function Tests:  Recent Labs Lab 08/28/16 1040  AST 58*  ALT 62*  ALKPHOS 58  BILITOT 0.3  PROT 6.1*  ALBUMIN 4.1   No results for input(s): LIPASE, AMYLASE in the last 168 hours. No results for input(s): AMMONIA in the last 168 hours. Coagulation Profile: No  results for input(s): INR, PROTIME in the last 168 hours. Cardiac Enzymes:  Recent Labs Lab 09/01/16 0043 09/01/16 0317 09/01/16 0640  TROPONINI <0.03 <0.03 <0.03   BNP (last 3 results) No results for input(s): PROBNP in the last 8760 hours. HbA1C: No results for input(s): HGBA1C in the last 72 hours. CBG: No results for input(s): GLUCAP in the last 168 hours. Lipid Profile: No results for input(s): CHOL, HDL, LDLCALC, TRIG, CHOLHDL, LDLDIRECT in the last 72 hours. Thyroid Function Tests: No results for input(s): TSH, T4TOTAL, FREET4, T3FREE, THYROIDAB in the last 72 hours. Anemia Panel: No results for input(s): VITAMINB12, FOLATE, FERRITIN, TIBC, IRON, RETICCTPCT in the last 72 hours. Sepsis Labs: No  results for input(s): PROCALCITON, LATICACIDVEN in the last 168 hours.  No results found for this or any previous visit (from the past 240 hour(s)).       Radiology Studies: Dg Chest 2 View  Result Date: 08/31/2016 CLINICAL DATA:  Shortness of breath EXAM: CHEST  2 VIEW COMPARISON:  08/28/2016, 10/10/2015 FINDINGS: Cutaneous staples over the right upper chest wall. Low lung volumes with bibasilar atelectasis. No acute consolidation or effusion. Heart size upper normal. No overt pulmonary edema. No pneumothorax. IMPRESSION: 1. Low lung volumes with streaky bibasilar atelectasis 2. No acute consolidation or overt pulmonary edema Electronically Signed   By: Donavan Foil M.D.   On: 08/31/2016 18:25        Scheduled Meds: . acyclovir  400 mg Oral Daily  . amoxicillin-clavulanate  1 tablet Oral BID  . Brexpiprazole  3 mg Oral QHS  . darifenacin  7.5 mg Oral Daily  . enoxaparin (LOVENOX) injection  50 mg Subcutaneous Q24H  . famotidine  20 mg Oral BID  . fenofibrate  160 mg Oral Daily  . gabapentin  600 mg Oral QID  . ipratropium-albuterol  3 mL Nebulization BID  . lamoTRIgine  200 mg Oral QHS  . levothyroxine  75 mcg Oral QAC breakfast  . modafinil  100 mg Oral Daily  .  OXcarbazepine  150 mg Oral BID  . pravastatin  20 mg Oral QHS  . predniSONE  40 mg Oral Q breakfast  . propranolol ER  80 mg Oral Daily  . venlafaxine XR  300 mg Oral Q breakfast  . zonisamide  100 mg Oral Daily   Continuous Infusions:   LOS: 0 days    Time spent: 35 minutes    Loretha Stapler, MD [facility] Pager [phone]  If 7PM-7AM, please contact night-coverage [email or web address] Password [email or web address] 09/01/2016, 3:02 PM

## 2016-09-01 NOTE — Progress Notes (Signed)
  Echocardiogram 2D Echocardiogram has been performed.  Donata Clay 09/01/2016, 10:41 AM

## 2016-09-01 NOTE — Progress Notes (Signed)
Pharmacy consult for Lovenox for VTE prophylaxis Due to BMI of 41 will order 0.5 mg/kg Lovenox 50 mg Percy q24h  Harvel Quale  09/01/2016 9:45 AM

## 2016-09-01 NOTE — Procedures (Signed)
CPAP is bedside and ready for use. Patient said she would place on when ready for bed.

## 2016-09-02 DIAGNOSIS — R079 Chest pain, unspecified: Secondary | ICD-10-CM | POA: Diagnosis not present

## 2016-09-02 DIAGNOSIS — G510 Bell's palsy: Secondary | ICD-10-CM

## 2016-09-02 DIAGNOSIS — R0609 Other forms of dyspnea: Secondary | ICD-10-CM | POA: Diagnosis not present

## 2016-09-02 DIAGNOSIS — N179 Acute kidney failure, unspecified: Secondary | ICD-10-CM | POA: Diagnosis not present

## 2016-09-02 MED ORDER — OXCARBAZEPINE 150 MG PO TABS: 150.0000 mg | ORAL_TABLET | Freq: Two times a day (BID) | ORAL | 0 refills | Status: DC

## 2016-09-02 MED ORDER — GUAIFENESIN-DM 100-10 MG/5ML PO SYRP: 5.0000 mL | ORAL_SOLUTION | ORAL | 0 refills | Status: DC | PRN

## 2016-09-02 MED ORDER — GUAIFENESIN-DM 100-10 MG/5ML PO SYRP
5.0000 mL | ORAL_SOLUTION | ORAL | Status: DC | PRN
  Administered 2016-09-02: 5 mL via ORAL
  Filled 2016-09-02: qty 5

## 2016-09-02 NOTE — Progress Notes (Addendum)
PRN Cough Medication provided to pt, per MD request.

## 2016-09-02 NOTE — Progress Notes (Addendum)
IV d/c'd, AVS reviewed, pt advised to contact her ride, as she is ready for d/c

## 2016-09-02 NOTE — Discharge Summary (Signed)
Physician Discharge Summary  [name] [id number] DOB: Oct 07, 1970 DOA: 08/31/2016  PCP: Berkley Harvey, NP  Admit date: 08/31/2016 Discharge date: 09/02/2016  Admitted From: Home Disposition:  Home  Recommendations for Outpatient Follow-up:  1. Follow up with PCP in 1-2 weeks 2. Take prescriptions as prescribed 3. Please obtain BMP/CBC in one week 4. Stay hydrated! Drink plenty of fluids  Home Health:No Equipment/Devices: None   Discharge Condition:Stable CODE STATUS: Full Code Diet recommendation: Regular Diet  Brief/Interim Summary: [name] a 45 y.o.woman with multiple comorbidities including Bipolar disorder with schizophrenia, chronic pain, fibromyalgia, migraines, OSA on CPAP, COPD (patient is not familiar with this diagnosis; she is not followed by a pulmonologist), Bell's palsy with right sided facial paralysis, GERD, nutcracker esophagus, and hypothyroidism who presents to the ED for evaluation of persistent shortness of breath, atypical chest pain, and swelling in her legs. She reports that symptoms started after have a nerve stimulator removed at [facility] just over one week ago. She saw her PCP at [facility] on 12/11 and was diagnosed with bronchitis. She received a single injection of solumedrol and a nebulizer treatment. She received prescriptions for Tessalon perles and a Breo inhaler. She was then seen in our ED for similar complaints on 12/12 and was diagnosed with acute sinusitis. She received a prescription for Augmentin. She went back to her PCP on 12/14. She was found to have an elevated BNP. She was advised to increase her oral lasix dose to 20mg  twice daily (she says that she had been on once daily dosing for about one month prior) and she was referred for outpatient cardiology follow-up. Apparently, she is in the ED tonight at the urging of her husband. She complains of right side chest tightness that is worse with inspiration. She says that she is  short of breath at rest and has DOE. No orthopnea. She has had swelling in her legs. She has a nonproductive cough. She has been light-headed but no syncope. She has had nausea but no vomiting. She has intermittent loose stools (but I notice multiple stool softeners/laxatives on her home med list). No watery diarrhea.  She denies any recent viral illnesses. During admission patient was evaluated for cause of chest pain and shortness of breath.  BNP only slightly elevated and troponins negative x 3.  Chest xray negative, D- dimer negative, chest CT shows no etiology for symptoms.  Patient reports some improvement with breathing treatments and cough syrup.  Will discharge with prescriptions for cough syrup and albuterol.  Discharge Diagnoses:  Principal Problem:   Dyspnea Active Problems:   Bell's palsy   Chronic pain   GERD (gastroesophageal reflux disease)   Obstructive sleep apnea   AKI (acute kidney injury) (HCC)   Chest pain    Discharge Instructions  Discharge Instructions    Activity as tolerated - No restrictions    Complete by:  As directed    Call MD for:  difficulty breathing, headache or visual disturbances    Complete by:  As directed    Call MD for:  persistant nausea and vomiting    Complete by:  As directed    Call MD for:  severe uncontrolled pain    Complete by:  As directed    Call MD for:  temperature >100.4    Complete by:  As directed    Diet general    Complete by:  As directed    Increase activity slowly    Complete by:  As  directed      Allergies as of 09/02/2016      Reactions   Dilaudid [hydromorphone Hcl] Itching   Iodinated Diagnostic Agents Other (See Comments)   unknown   Ketorolac Tromethamine Swelling   Nsaids    Can't take for fibromyalgia   Salicylates Other (See Comments)   unknown   Tape Other (See Comments)   Blisters - any type of tape and Band-Aids       Medication List    TAKE these medications   acyclovir 400 MG  tablet Commonly known as:  ZOVIRAX TAKE 1 TABLET BY MOUTH DAILY   albuterol 108 (90 Base) MCG/ACT inhaler Commonly known as:  PROVENTIL HFA;VENTOLIN HFA Inhale 1 puff into the lungs every 6 (six) hours as needed for wheezing or shortness of breath.   amoxicillin-clavulanate 875-125 MG tablet Commonly known as:  AUGMENTIN Take 1 tablet by mouth every 12 (twelve) hours.   benzonatate 100 MG capsule Commonly known as:  TESSALON Take 100 mg by mouth 3 (three) times daily as needed for cough.   Biotin 5000 MCG Caps Take 10,000 mcg by mouth 2 (two) times daily.   budesonide-formoterol 80-4.5 MCG/ACT inhaler Commonly known as:  SYMBICORT Inhale 2 puffs into the lungs 2 (two) times daily.   candesartan 4 MG tablet Commonly known as:  ATACAND Take 8 mg by mouth daily.   cetirizine 10 MG tablet Commonly known as:  ZYRTEC Take 10 mg by mouth daily.   dexmethylphenidate 10 MG tablet Commonly known as:  FOCALIN Take 10 mg by mouth 3 (three) times daily.   diazepam 2 MG tablet Commonly known as:  VALIUM Take 4 mg by mouth at bedtime.   fenofibrate 145 MG tablet Commonly known as:  TRICOR Take 145 mg by mouth at bedtime.   fluticasone 50 MCG/ACT nasal spray Commonly known as:  FLONASE Place 2 sprays into the nose daily as needed for allergies.   gabapentin 600 MG tablet Commonly known as:  NEURONTIN Take 600 mg by mouth 4 (four) times daily.   guaiFENesin-dextromethorphan 100-10 MG/5ML syrup Commonly known as:  ROBITUSSIN DM Take 5 mLs by mouth every 4 (four) hours as needed for cough.   lamoTRIgine 200 MG tablet Commonly known as:  LAMICTAL Take 200 mg by mouth at bedtime.   levothyroxine 75 MCG tablet Commonly known as:  SYNTHROID, LEVOTHROID Take 75 mcg by mouth daily before breakfast.   meloxicam 15 MG tablet Commonly known as:  MOBIC Take 15 mg by mouth daily as needed for pain.   metaxalone 800 MG tablet Commonly known as:  SKELAXIN Take 800 mg by mouth 3  (three) times daily as needed for muscle spasms.   multivitamin with minerals Tabs tablet Take 1 tablet by mouth daily.   NUVIGIL 250 MG tablet Generic drug:  Armodafinil Take 250 mg by mouth daily.   OXcarbazepine 150 MG tablet Commonly known as:  TRILEPTAL Take 1 tablet (150 mg total) by mouth 2 (two) times daily. What changed:  Another medication with the same name was removed. Continue taking this medication, and follow the directions you see here.   oxyCODONE-acetaminophen 7.5-325 MG tablet Commonly known as:  PERCOCET Take 1 tablet by mouth 2 times daily at 12 noon and 4 pm.   pravastatin 20 MG tablet Commonly known as:  PRAVACHOL Take 20 mg by mouth at bedtime.   propranolol ER 80 MG 24 hr capsule Commonly known as:  INDERAL LA Take 80 mg by mouth daily.  REFRESH LIQUIGEL OP Apply 1 drop to eye every 4 (four) hours as needed (dryness).   REXULTI 3 MG Tabs Generic drug:  Brexpiprazole Take 3 mg by mouth at bedtime.   senna 8.6 MG Tabs tablet Commonly known as:  SENOKOT Take 3 tablets by mouth 2 (two) times daily.   trospium 20 MG tablet Commonly known as:  SANCTURA Take 20 mg by mouth 2 (two) times daily.   urea 40 % Crea Commonly known as:  CARMOL Apply 1 application topically at bedtime.   venlafaxine XR 150 MG 24 hr capsule Commonly known as:  EFFEXOR-XR Take 300 mg by mouth daily with breakfast.      Follow-up Information    Berkley Harvey, NP. Schedule an appointment as soon as possible for a visit in 1 week(s).   Specialty:  Nurse Practitioner Contact information: [address] [place] [address] [phone]          Allergies  Allergen Reactions  . Dilaudid [Hydromorphone Hcl] Itching  . Iodinated Diagnostic Agents Other (See Comments)    unknown  . Ketorolac Tromethamine Swelling  . Nsaids     Can't take for fibromyalgia   . Salicylates Other (See Comments)    unknown  . Tape Other (See Comments)    Blisters - any  type of tape and Band-Aids     Consultations:  None   Procedures/Studies: Dg Chest 2 View  Result Date: 08/31/2016 CLINICAL DATA:  Shortness of breath EXAM: CHEST  2 VIEW COMPARISON:  08/28/2016, 10/10/2015 FINDINGS: Cutaneous staples over the right upper chest wall. Low lung volumes with bibasilar atelectasis. No acute consolidation or effusion. Heart size upper normal. No overt pulmonary edema. No pneumothorax. IMPRESSION: 1. Low lung volumes with streaky bibasilar atelectasis 2. No acute consolidation or overt pulmonary edema Electronically Signed   By: Donavan Foil M.D.   On: 08/31/2016 18:25   Ct Head Wo Contrast  Result Date: 08/28/2016 CLINICAL DATA:  45 year old female with right side headache dizziness and altered mental status following recent surgical removal of superficial frontal nerve stimulators. Initial encounter. EXAM: CT HEAD WITHOUT CONTRAST TECHNIQUE: Contiguous axial images were obtained from the base of the skull through the vertex without intravenous contrast. COMPARISON:  Head CT 02/27/2016. FINDINGS: Brain: Cerebral volume remains normal. No midline shift, ventriculomegaly, mass effect, evidence of mass lesion, intracranial hemorrhage or evidence of cortically based acute infarction. Gray-white matter differentiation is within normal limits throughout the brain. Vascular: No suspicious intracranial vascular hyperdensity. Skull: No osseous abnormality identified. Sinuses/Orbits: Tympanic cavities and visible mastoid air cells remain clear. Increased bubbly opacity and mucosal thickening in both maxillary sinuses and sphenoid sinuses. Stable minimal to Mild ethmoid sinus mucosal thickening. Frontal sinuses remain clear. Other: Skin staples posterior to the right ear. The right side approach bifrontal scalp nerve stimulators have been removed. No associated scout fluid or gas. The bilateral occipital nerve stimulators remain in place and appears stable. Visualized orbit soft  tissues are within normal limits. IMPRESSION: 1. Stable and normal noncontrast CT appearance of the brain. 2. New mild to moderate maxillary and sphenoid sinus inflammatory changes suggesting acute sinusitis. 3. Interval removal of the right side approach bifrontal scalp nerve stimulators with no adverse features. Stable appearance of bilateral occipital nerve stimulators. Electronically Signed   By: Genevie Ann M.D.   On: 08/28/2016 11:45   Ct Chest Wo Contrast  Result Date: 09/01/2016 CLINICAL DATA:  45 year old female with chest pain. Symptoms started after dinner stimulator  was removed over a week ago. Patient was diagnosed with bronchitis on 08/27/2016. History of asthma. EXAM: CT CHEST WITHOUT CONTRAST TECHNIQUE: Multidetector CT imaging of the chest was performed following the standard protocol without IV contrast. COMPARISON:  Chest radiograph dated 08/29/2016 and chest CT dated 06/17/2015 FINDINGS: Evaluation of this exam is limited in the absence of intravenous contrast. Cardiovascular: There is no cardiomegaly or pericardial effusion. There is mild hypoattenuation of the cardiac blood pool suggestive of mild anemia. Clinical correlation is recommended. The thoracic aorta and central pulmonary arteries are grossly unremarkable on this noncontrast study. Mediastinum/Nodes: No enlarged mediastinal or axillary lymph nodes. Thyroid gland, trachea, and esophagus demonstrate no significant findings. Lungs/Pleura: Bibasilar hazy airspace densities as well as hazy subpleural densities most compatible with atelectatic changes. Faint focal hazy density in the left upper lobe (series 201, image 33) most likely related to atelectasis. Developing infiltrate is less likely. Clinical correlation is recommended. There is no consolidative changes. No pleural effusion or pneumothorax. The central airways are patent. Upper Abdomen: There is diffuse fatty infiltration of the liver. The visualized upper abdomen is otherwise  unremarkable. Musculoskeletal: There is degenerative changes of the left shoulder as well as mild degenerative changes of the spine. No acute fracture. IMPRESSION: No acute intrathoracic pathology. Minimal hazy bibasilar and subpleural densities most compatible with atelectatic changes. Electronically Signed   By: Anner Crete M.D.   On: 09/01/2016 21:53   Dg Chest Port 1 View  Result Date: 08/28/2016 CLINICAL DATA:  Shortness of breath and vicelike chest pressure, altered mental status, dizziness. History of asthma-bronchitis. EXAM: PORTABLE CHEST 1 VIEW COMPARISON:  PA and lateral chest x-ray of October 10, 2015 FINDINGS: The lungs are adequately inflated and clear. There is no pneumothorax, pneumomediastinum, or pleural effusion. The heart and pulmonary vascularity are normal. The mediastinum is normal in width. The patient has undergone removal of a nerve stimulator generator from the right pectoral region. Skin staples are present over the upper right pectoral region. The observed bony thorax is unremarkable. IMPRESSION: There is no active cardiopulmonary disease. Electronically Signed   By: David  Martinique M.D.   On: 08/28/2016 11:04      Subjective: Patient states this morning she feels sleepy.  After administration of cough syrup she reports improvement in her cough.  Pain is reasonably controlled on her medication.  Wants to go home.  Discharge Exam: Vitals:   09/02/16 0435 09/02/16 1200  BP: 107/71 116/66  Pulse:  64  Resp: 19 20  Temp: 97.8 F (36.6 C) 98 F (36.7 C)   Vitals:   09/02/16 0045 09/02/16 0435 09/02/16 0929 09/02/16 1200  BP: 121/68 107/71  116/66  Pulse: 65   64  Resp: 18 19  20   Temp: 98 F (36.7 C) 97.8 F (36.6 C)  98 F (36.7 C)  TempSrc: Oral Oral  Oral  SpO2: 100% 96% 94% 96%  Weight:  105.1 kg (231 lb 11.2 oz)    Height:        General: Pt is alert, awake, not in acute distress Cardiovascular: RRR, S1/S2 +, no rubs, no gallops Respiratory: CTA  bilaterally, no wheezing, no rhonchi Abdominal: Soft, NT, ND, bowel sounds + Extremities: no edema, no cyanosis  Telemetry strip showing NSR  The results of significant diagnostics from this hospitalization (including imaging, microbiology, ancillary and laboratory) are listed below for reference.     Microbiology: No results found for this or any previous visit (from the past 240 hour(s)).  Labs: BNP (last 3 results)  Recent Labs  08/31/16 1738  BNP [id number]*   Basic Metabolic Panel:  Recent Labs Lab 08/28/16 1040 08/31/16 1738 09/01/16 0640  NA 137 139 136  K 3.9 3.8 4.4  CL 102 98* 103  CO2 27 29 26   GLUCOSE 122* 145* 165*  BUN 16 22* 16  CREATININE 0.99 1.27* 1.05*  CALCIUM 9.2 9.4 9.2   Liver Function Tests:  Recent Labs Lab 08/28/16 1040  AST 58*  ALT 62*  ALKPHOS 58  BILITOT 0.3  PROT 6.1*  ALBUMIN 4.1   No results for input(s): LIPASE, AMYLASE in the last 168 hours. No results for input(s): AMMONIA in the last 168 hours. CBC:  Recent Labs Lab 08/28/16 1040 08/31/16 1738 08/31/16 2038 09/01/16 0640  WBC 4.3 5.2  --  5.5  NEUTROABS 2.5  --  3.5  --   HGB 10.2* 10.6*  --  10.6*  HCT 31.7* 33.7*  --  34.1*  MCV 86.1 86.2  --  88.1  PLT 214 260  --  242   Cardiac Enzymes:  Recent Labs Lab 09/01/16 0043 09/01/16 0317 09/01/16 0640  TROPONINI <0.03 <0.03 <0.03   BNP: Invalid input(s): POCBNP CBG: No results for input(s): GLUCAP in the last 168 hours. D-Dimer  Recent Labs  09/01/16 1537  DDIMER 0.27   Hgb A1c No results for input(s): HGBA1C in the last 72 hours. Lipid Profile No results for input(s): CHOL, HDL, LDLCALC, TRIG, CHOLHDL, LDLDIRECT in the last 72 hours. Thyroid function studies No results for input(s): TSH, T4TOTAL, T3FREE, THYROIDAB in the last 72 hours.  Invalid input(s): FREET3 Anemia work up No results for input(s): VITAMINB12, FOLATE, FERRITIN, TIBC, IRON, RETICCTPCT in the last 72 hours. Urinalysis     Component Value Date/Time   COLORURINE YELLOW 06/17/2015 2115   APPEARANCEUR CLEAR 06/17/2015 2115   LABSPEC 1.015 06/17/2015 2115   PHURINE 6.0 06/17/2015 2115   GLUCOSEU NEGATIVE 06/17/2015 2115   HGBUR NEGATIVE 06/17/2015 2115   BILIRUBINUR NEGATIVE 06/17/2015 2115   KETONESUR NEGATIVE 06/17/2015 2115   PROTEINUR NEGATIVE 06/17/2015 2115   UROBILINOGEN 0.2 06/17/2015 2115   NITRITE NEGATIVE 06/17/2015 2115   LEUKOCYTESUR NEGATIVE 06/17/2015 2115   Sepsis Labs Invalid input(s): PROCALCITONIN,  WBC,  LACTICIDVEN Microbiology No results found for this or any previous visit (from the past 240 hour(s)).   Time coordinating discharge: Less than 30 minutes  SIGNED:   Loretha Stapler, MD  [facility] 09/02/2016, 3:29 PM Pager [phone] If 7PM-7AM, please contact night-coverage [email or web address] Password [email or web address]

## 2016-09-03 MED FILL — Brexpiprazole Tab 3 MG: ORAL | Qty: 1 | Status: AC

## 2016-09-25 DIAGNOSIS — Z9181 History of falling: Secondary | ICD-10-CM | POA: Insufficient documentation

## 2016-09-26 ENCOUNTER — Telehealth: Payer: Self-pay

## 2016-09-26 ENCOUNTER — Ambulatory Visit: Payer: Medicare Other | Admitting: Neurology

## 2016-09-26 NOTE — Telephone Encounter (Signed)
I called pt again to r/s, no answer, left a detailed message on mobile number (per DPR) advising her that Dr. Brett Fairy is out sick today and her appt will need to be rescheduled.

## 2016-09-26 NOTE — Telephone Encounter (Signed)
Dr. Brett Fairy is out sick today and therefore pt's appt needs to be r/s. I called pt but no answer, left a message asking her to call me back. If pt calls back, please get her rescheduled with Dr. Brett Fairy at her soonest available office visit (currently, the soonest appears to be 11/01/16.)

## 2016-10-23 ENCOUNTER — Telehealth: Payer: Self-pay | Admitting: Neurology

## 2016-10-23 ENCOUNTER — Encounter: Payer: Self-pay | Admitting: Neurology

## 2016-10-23 DIAGNOSIS — G4733 Obstructive sleep apnea (adult) (pediatric): Secondary | ICD-10-CM

## 2016-10-23 NOTE — Telephone Encounter (Signed)
Patient called stated she has the flu and uses a CPAP machine.  Per patient she is needing to see what she can do in order to receive a full face mask, currently using nasal but unable to breath thru her nose.  Please call

## 2016-10-23 NOTE — Telephone Encounter (Signed)
OK to send order for [facility]?

## 2016-10-23 NOTE — Addendum Note (Signed)
Addended by: Larey Seat on: 10/23/2016 10:57 AM   Modules accepted: Orders

## 2016-10-23 NOTE — Telephone Encounter (Signed)
Cyril Mourning- this was sent to me by mistake. She is a Dr Dohmeier patient

## 2016-10-23 NOTE — Telephone Encounter (Signed)
I spoke to patient and advised her that we are sending orders to [facility] (her DME). Patient voiced understanding.

## 2016-10-23 NOTE — Telephone Encounter (Signed)
Order for [facility] ok, but fitting at DME needed. CD

## 2016-11-14 ENCOUNTER — Encounter: Payer: Self-pay | Admitting: Neurology

## 2016-11-14 ENCOUNTER — Ambulatory Visit (INDEPENDENT_AMBULATORY_CARE_PROVIDER_SITE_OTHER): Payer: Commercial Managed Care - PPO | Admitting: Neurology

## 2016-11-14 VITALS — BP 118/72 | HR 80 | Resp 18 | Ht 62.0 in | Wt 212.0 lb

## 2016-11-14 DIAGNOSIS — J209 Acute bronchitis, unspecified: Secondary | ICD-10-CM | POA: Diagnosis not present

## 2016-11-14 DIAGNOSIS — G4730 Sleep apnea, unspecified: Secondary | ICD-10-CM | POA: Diagnosis not present

## 2016-11-14 DIAGNOSIS — G510 Bell's palsy: Secondary | ICD-10-CM

## 2016-11-14 DIAGNOSIS — Z9989 Dependence on other enabling machines and devices: Secondary | ICD-10-CM | POA: Diagnosis not present

## 2016-11-14 DIAGNOSIS — E6609 Other obesity due to excess calories: Secondary | ICD-10-CM

## 2016-11-14 DIAGNOSIS — G4733 Obstructive sleep apnea (adult) (pediatric): Secondary | ICD-10-CM | POA: Diagnosis not present

## 2016-11-14 DIAGNOSIS — J45909 Unspecified asthma, uncomplicated: Secondary | ICD-10-CM | POA: Diagnosis not present

## 2016-11-14 DIAGNOSIS — G4710 Hypersomnia, unspecified: Secondary | ICD-10-CM

## 2016-11-14 NOTE — Progress Notes (Signed)
PATIENT: [name] DOB: 17-Apr-1971  REASON FOR VISIT: follow up- OSA on CPAP HISTORY FROM: patient  HISTORY OF PRESENT ILLNESS: [name] is a 46 year old female with a history of obstructive sleep apnea on CPAP. [name] reports a fatigue severity score of 55 points and the Epworth sleepiness score of 15 points. She states that she does not fall asleep in conversations overdriving, but easily falls asleep after lunch, when resting sitting and not being physically active or mentally stimulated. She is falling asleep promptly when watching TV or reading. Fifth she has been compliant with CPAP but has extremely high air leaks, which could partially be explained by her Bell's palsy. I think a nasal mask or nasal pillow would be the best interface for her.She is now being managed by [facility]. She returns today for an evaluation. She [facility] and finds this very helpful for her fibromyalgia and fatigue. She also established a walking regimen.  She has used to machine 26 out of 30 days and only 23 days over 4 hours with a 77% compliance, set pressure is 6.6 cm water, AHI is 1.9 but her leaks have reached an 95th percentile of 17.5 L which is very high. I do think that her leaks affect her apnea control as a parts correlate. The leaks may also be related to the facial palsy as described above. Fatigue may also be related to fibromyalgia, she has been followed by the [facility].   REVIEW OF SYSTEMS: Out of a complete 14 system review of symptoms, the patient complains only of the following symptoms, and all other reviewed systems are negative.  [name] reports a fatigue severity score of 55 points and the Epworth sleepiness score of 15 points. She states that she does not fall asleep in conversations overdriving, but easily falls asleep after lunch, when resting sitting and not being physically active or mentally stimulated. She is falling asleep  promptly when watching TV or reading. Fifth she has been compliant with CPAP but has extremely high air leaks, which could partially be explained by her Bell's palsy. I think a nasal mask or nasal pillow would be the best interface for her   Light sensitivity, excessive sweating, cold intolerance, heat intolerance, diarrhea, nausea, insomnia, apnea, depression, nervous or changes, hallucinations, incontinence of bladder, memory loss, dizziness, headache, environmental allergies. She reports fatigue, flushing, constipation, restless legs, insomnia, apnea, frequently waking up partially due to air leaks, trouble swallowing, frequency of urination and incontinence, joint pain, back pain, muscle pain, muscle cramping, facial droop, the feeling of being weak and heavy, headaches, memory loss, agitation depression, nervousness, anxiousness, behavior problems, hyperactivity. History of skin cancer. Strong history of diabetes in the family.  ALLERGIES: Allergies  Allergen Reactions  . Dilaudid [Hydromorphone Hcl] Itching  . Iodinated Diagnostic Agents Other (See Comments)    unknown  . Ketorolac Tromethamine Swelling  . Nsaids     Can't take for fibromyalgia   . Salicylates Other (See Comments)    unknown  . Tape Other (See Comments)    Blisters - any type of tape and Band-Aids     HOME MEDICATIONS: Outpatient Medications Prior to Visit  Medication Sig Dispense Refill  . acyclovir (ZOVIRAX) 400 MG tablet TAKE 1 TABLET BY MOUTH DAILY    . albuterol (PROVENTIL HFA;VENTOLIN HFA) 108 (90 BASE) MCG/ACT inhaler Inhale 1 puff into the lungs every 6 (six) hours as needed for wheezing or shortness  of breath.    . Armodafinil (NUVIGIL) 250 MG tablet Take 250 mg by mouth daily.    . benzonatate (TESSALON) 100 MG capsule Take 100 mg by mouth 3 (three) times daily as needed for cough.    . Biotin 5000 MCG CAPS Take 10,000 mcg by mouth 2 (two) times daily.     . Brexpiprazole (REXULTI) 3 MG TABS Take 3 mg by  mouth at bedtime.    . Carboxymethylcellulose Sodium (REFRESH LIQUIGEL OP) Apply 1 drop to eye every 4 (four) hours as needed (dryness).     . cetirizine (ZYRTEC) 10 MG tablet Take 10 mg by mouth daily.    . diazepam (VALIUM) 2 MG tablet Take 4 mg by mouth at bedtime.     . fenofibrate (TRICOR) 145 MG tablet Take 145 mg by mouth at bedtime.     . fluticasone (FLONASE) 50 MCG/ACT nasal spray Place 2 sprays into the nose daily as needed for allergies.     Marland Kitchen gabapentin (NEURONTIN) 600 MG tablet Take 600 mg by mouth 4 (four) times daily.    Marland Kitchen guaiFENesin-dextromethorphan (ROBITUSSIN DM) 100-10 MG/5ML syrup Take 5 mLs by mouth every 4 (four) hours as needed for cough. 118 mL 0  . lamoTRIgine (LAMICTAL) 200 MG tablet Take 200 mg by mouth at bedtime.     Marland Kitchen levothyroxine (SYNTHROID, LEVOTHROID) 75 MCG tablet Take 75 mcg by mouth daily before breakfast.    . meloxicam (MOBIC) 15 MG tablet Take 15 mg by mouth daily as needed for pain.     . metaxalone (SKELAXIN) 800 MG tablet Take 800 mg by mouth 3 (three) times daily as needed for muscle spasms.     . Multiple Vitamin (MULTIVITAMIN WITH MINERALS) TABS tablet Take 1 tablet by mouth daily.    . OXcarbazepine (TRILEPTAL) 150 MG tablet Take 1 tablet (150 mg total) by mouth 2 (two) times daily. (Patient taking differently: Take 150 mg by mouth 4 (four) times daily. ) 30 tablet 0  . pravastatin (PRAVACHOL) 20 MG tablet Take 20 mg by mouth at bedtime.     . propranolol ER (INDERAL LA) 80 MG 24 hr capsule Take 80 mg by mouth daily.    . trospium (SANCTURA) 20 MG tablet Take 20 mg by mouth 2 (two) times daily.    . urea (CARMOL) 40 % CREA Apply 1 application topically at bedtime.    Marland Kitchen venlafaxine XR (EFFEXOR-XR) 150 MG 24 hr capsule Take 300 mg by mouth daily with breakfast.    . amoxicillin-clavulanate (AUGMENTIN) 875-125 MG tablet Take 1 tablet by mouth every 12 (twelve) hours. 14 tablet 0  . budesonide-formoterol (SYMBICORT) 80-4.5 MCG/ACT inhaler Inhale 2  puffs into the lungs 2 (two) times daily.    . candesartan (ATACAND) 4 MG tablet Take 8 mg by mouth daily.    Marland Kitchen dexmethylphenidate (FOCALIN) 10 MG tablet Take 10 mg by mouth 3 (three) times daily.    Marland Kitchen oxyCODONE-acetaminophen (PERCOCET) 7.5-325 MG tablet Take 1 tablet by mouth 2 times daily at 12 noon and 4 pm.     . senna (SENOKOT) 8.6 MG TABS tablet Take 3 tablets by mouth 2 (two) times daily.      No facility-administered medications prior to visit.     PAST MEDICAL HISTORY: Past Medical History:  Diagnosis Date  . Anxiety   . Asthma   . Bell's palsy   . Bipolar disorder ([place])   . Cancer ([place])    skin  . Chronic headache disorder  03/08/2015  . Collapsed lung    right lung  . COPD (chronic obstructive pulmonary disease) ([place])   . Depression   . Dislocated shoulder   . Dyslipidemia   . Fatigue   . Fibromyalgia   . GERD (gastroesophageal reflux disease)   . Hypercholesteremia   . Migraine   . Nutcracker esophagus   . Obsessive-compulsive disorder   . OSA (obstructive sleep apnea)   . 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 Sleep apnea   . Thyroid disease     PAST SURGICAL HISTORY: Past Surgical History:  Procedure Laterality Date  . ABDOMINAL HYSTERECTOMY    . ans     2001, 2010  . ANS unit     . CESAREAN SECTION    . CESAREAN SECTION     2007  . DILATION AND CURETTAGE OF UTERUS     2007  . DNC    . ESOPHAGEAL MANOMETRY N/A 04/29/2015   Procedure: ESOPHAGEAL MANOMETRY (EM);  Surgeon: Arta Silence, MD;  Location: [facility];  Service: Endoscopy;  Laterality: N/A;  . gallbladder removed    . GALLBLADDER SURGERY     1998  . stretching of esophagus    . Tens unit     04/2014    FAMILY HISTORY: Family History  Problem Relation Age of Onset  . Adopted: Yes  . Depression Mother   . Dementia Mother   . Depression Maternal Grandmother   . Diabetes Father     SOCIAL HISTORY: Social History   Social History  . Marital status: Married    Spouse name: N/A   . Number of children: 1  . Years of education: college   Occupational History  . disabled    Social History Main Topics  . Smoking status: Former Smoker    Quit date: 06/25/2000  . Smokeless tobacco: Never Used  . Alcohol use No  . Drug use: No  . Sexual activity: Not on file   Other Topics Concern  . Not on file   Social History Narrative   Caffeine - patient is trying to stop drinking caffeine (she drinks 1 "huge" cup of caffeine daily)   Patient is right handed.      PHYSICAL EXAM  Vitals:   11/14/16 0934  BP: 118/72  Pulse: 80  Resp: 18  Weight: 212 lb (96.2 kg)  Height: 5\' 2"  (1.575 m)   Body mass index is 38.78 kg/m.  Generalized: History of morbid obesity, current BMI is 39. Neck: Conference 16.25 inches, Mallampati 3  " I have fibromyalgia "   Neurological examination  Mentation: Alert oriented to time, place, history taking. Follows all commands speech and language fluent. She has a raspy voice. Her sense of taste and smell was affected by the severe cold/ flu ( presumed )   Pupils are round and reactive to light,  Right ptosis. Right sided facial palsy, Bell's.  Facial sensory is not affected. tongue and  uvula midline. Hearing impaired with hyperacusis.    Muscle tone and muscle bulk and symmetric, with no sign of atrophy no sign of fasciculation, grip strength bilaterally intact, fine motor skills are preserved handwriting has not changed. Coordination without signs of ataxia or tremor or dysmetria.   DIAGNOSTIC DATA (LABS, IMAGING, TESTING)    Lab Results  Component Value Date   WBC 5.5 09/01/2016   HGB 10.6 (L) 09/01/2016   HCT 34.1 (L) 09/01/2016   MCV 88.1 09/01/2016   PLT 242 09/01/2016  Component Value Date/Time   NA 136 09/01/2016 0640   K 4.4 09/01/2016 0640   CL 103 09/01/2016 0640   CO2 26 09/01/2016 0640   GLUCOSE 165 (H) 09/01/2016 0640   BUN 16 09/01/2016 0640   CREATININE 1.05 (H) 09/01/2016 0640   CREATININE 0.71  06/17/2012 1325   CALCIUM 9.2 09/01/2016 0640   PROT 6.1 (L) 08/28/2016 1040   ALBUMIN 4.1 08/28/2016 1040   AST 58 (H) 08/28/2016 1040   ALT 62 (H) 08/28/2016 1040   ALKPHOS 58 08/28/2016 1040   BILITOT 0.3 08/28/2016 1040   GFRNONAA >60 09/01/2016 0640   GFRAA >60 09/01/2016 0640       ASSESSMENT AND PLAN 46 y.o. year old right-handed caucasian patient is seen here for 25 minutes in a scheduled Rv, had complications in CPAP compliacne due to presumed influenza . Her CPAP fell and is broken. The patient has Bell's Palsy on the right, affecting the air-seal of the CPAP interface.      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 here with:  1. Obstructive sleep apnea on CPAP  [name]'s CPAP machine has been set at 6.6 cm water without EPR providing a residual AHI of 1.9 but very very high air leaks are noted in the process. She is highly compliant 87% has only used it 26 out of 30 days, with an average user time of 5 hours and 27 minutes. It is the airleak and 30 addition her CPAP is in that makes me wonder if it can supply a sufficient amount of air for her to feel well. The machine is broken and needs to be replaced.  The patient overcame the flu, her doctor's office tested her the flu test itself was negative but has a 50% false rate she suffered 14 days fever, chills, gastrointestinal symptoms coughing and shortness of breath. He had received a flu vaccination. She reports having fibromyalgia and it is important to have an exercise regimen,  walking , swimming. This helps with weight loss,  Keep hydration up (8 glasses of  water )    Nichoals Heyde, MD  11/14/2016, 9:58 AM [facility] [address] [place], [place] [address] [phone]

## 2016-11-18 ENCOUNTER — Encounter: Payer: Self-pay | Admitting: Neurology

## 2016-11-18 ENCOUNTER — Ambulatory Visit (INDEPENDENT_AMBULATORY_CARE_PROVIDER_SITE_OTHER): Payer: Commercial Managed Care - PPO | Admitting: Neurology

## 2016-11-18 DIAGNOSIS — E6609 Other obesity due to excess calories: Secondary | ICD-10-CM

## 2016-11-18 DIAGNOSIS — J209 Acute bronchitis, unspecified: Secondary | ICD-10-CM

## 2016-11-18 DIAGNOSIS — G4733 Obstructive sleep apnea (adult) (pediatric): Secondary | ICD-10-CM

## 2016-11-18 DIAGNOSIS — Z9989 Dependence on other enabling machines and devices: Principal | ICD-10-CM

## 2016-11-18 DIAGNOSIS — J45909 Unspecified asthma, uncomplicated: Secondary | ICD-10-CM

## 2016-11-18 DIAGNOSIS — G510 Bell's palsy: Secondary | ICD-10-CM

## 2016-11-18 DIAGNOSIS — G4730 Sleep apnea, unspecified: Secondary | ICD-10-CM

## 2016-11-18 DIAGNOSIS — G4710 Hypersomnia, unspecified: Secondary | ICD-10-CM

## 2016-11-21 NOTE — Procedures (Signed)
PATIENT'S NAME:  [name], [name] DOB:      1970-11-20      MR#:    [id number]     DATE OF RECORDING: 11/18/2016 REFERRING M.D.:  [name], MD Study Performed:   Baseline Polysomnogram HISTORY:  Ms. Tuff is a 46 year old female with a history of obstructive sleep apnea on CPAP. Mrs. Halliwell reports a fatigue severity score of 55 points and Epworth sleepiness score of 15 points. she has been compliant with CPAP but has extremely high air leaks, which could partially be explained by her Bell's palsy.  The patient's weight 212 pounds with a height of 62 (inches), resulting in a BMI of 38.9 kg/m2. The patient's neck circumference measured 16.2 inches.  CURRENT MEDICATIONS: Acyclovir, Albuterol, Armodafinil, Benzonatate, Biotin, Brexpiprazole, Carboxymethylcellulose, Cetirizine, Diazepam, Fenofibrate, Fluticasone, Gabapentin, Guaifenesin, Lamotrigine, Levothyroxine, Meloxicam, Metaxalone, Multi Vitamin,   PROCEDURE:  This is a multichannel digital polysomnogram utilizing the Somnostar 11.2 system.  Electrodes and sensors were applied and monitored per AASM Specifications.   EEG, EOG, Chin and Limb EMG, were sampled at 200 Hz.  ECG, Snore and Nasal Pressure, Thermal Airflow, Respiratory Effort, CPAP Flow and Pressure, Oximetry was sampled at 50 Hz. Digital video and audio were recorded.      BASELINE STUDY Lights Out was at 21:49 and Lights On at 04:54.  Total recording time (TRT) was 425.5 minutes, with a total sleep time (TST) of 349.5 minutes.   The patient's sleep latency was 5.5 minutes.  REM latency was 161 minutes.  The sleep efficiency was 82.1 %.     SLEEP ARCHITECTURE: WASO (Wake after sleep onset) was 70.5 minutes.  There were 19 minutes in Stage N1, 313.5 minutes Stage N2, 0 minutes Stage N3 and 17 minutes in Stage REM.  The percentage of Stage N1 was 5.4%, Stage N2 was 89.7%, Stage N3 was 0% and Stage R (REM sleep) was 4.9%.   RESPIRATORY ANALYSIS:  There were 10 respiratory events:  0 apneas and  10 hypopneas with 0 respiratory event related arousals (RERAs).  The total APNEA/HYPOPNEA INDEX (AHI) was 1.7/hour and the total RESPIRATORY DISTURBANCE INDEX was 1.7 /hour. 5 events occurred in REM sleep and 10 events in NREM. The REM AHI was 17.6 /hr. versus a non-REM AHI of 0.9. The patient spent 199 minutes of total sleep time in the supine position and 151 minutes in non-supine. The supine AHI was 2.1 versus a non-supine AHI of 1.2.  OXYGEN SATURATION & C02:  The Wake baseline 02 saturation was 90%, with the lowest being 82%. Time spent below 89% saturation equaled 31 minutes.   PERIODIC LIMB MOVEMENTS:  The patient had a total of 0 Periodic Limb Movements.   The arousals were noted as: 56 were spontaneous, 0 were associated with PLMs,10 were associated with respiratory events. Audio and video analysis did not show any abnormal or unusual movements, behaviors, phonations or vocalizations.  The patient took one bathroom break. Positional dependent Snoring was noted. EKG was in keeping with normal sinus rhythm (NSR).   IMPRESSION: No evidence of OSA, only supine positional dependent snoring was noted. No PLMs seen, normal EKG, EEG and oxygens saturation.     RECOMMENDATIONS: Snoring can be treated with a dental device.  CPAP is not indicated, weight loss is recommended.  Avoiding the supine sleep position is recommended ( Tennis ball method)    I certify that I have reviewed the entire raw data recording prior to the issuance of this report in accordance with the  Standards of Accreditation of the [employer] of [employer] Medicine (AASM)   Larey Seat, MD  [id number]  Diplomat, American Board of Psychiatry and Neurology  Diplomat, American Board of [place] Director, [place] [facility]

## 2016-11-27 ENCOUNTER — Telehealth: Payer: Self-pay

## 2016-11-27 DIAGNOSIS — R0683 Snoring: Secondary | ICD-10-CM

## 2016-11-27 NOTE — Telephone Encounter (Signed)
-----   Message from Larey Seat, MD sent at 11/21/2016  5:07 PM EST ----- The arousals were noted as: 56 were spontaneous, 0 were associated with PLMs, and 10 were associated with respiratory events. Audio and video analysis did not show any abnormal or unusual movements, behaviors, phonations or vocalizations.  The patient took one bathroom break. Positional dependent Snoring was noted. EKG was in keeping with normal sinus rhythm (NSR).   IMPRESSION: No evidence of OSA, only supine positional dependent snoring was noted. No PLMs seen, normal EKG, EEG and oxygens saturation.     RECOMMENDATIONS: Snoring can be treated with a dental device.  CPAP is not indicated, weight loss is recommended.  Avoiding the supine sleep position is recommended ( Tennis ball method)    I certify that I have reviewed the entire raw data recording prior to the issuance of this report in accordance with the Standards of Accreditation of the [employer] (AASM)   Larey Seat, MD  11-21-3016

## 2016-11-27 NOTE — Telephone Encounter (Signed)
Pt came to the office today because she was unable to get through calling us to receive her sleep study results.  I advised her that Dr. Brett Fairy did not see any evidence of sleep apnea, only supine dependent snoring was noted. Pt declined a dental device for her snoring. No PLMs seen, normal EKG and EEG and normal O2. Dr. Brett Fairy recommended to pt that CPAP is not indicated but weight loss is recommended. I advised pt to sleep on her back and use the tennis ball method to avoid supine sleep. Pt says that she needs an order sent to [facility] so she may return her cpap machine. Pt verbalized understanding of results. Pt had no questions at this time but was encouraged to call back if questions arise.  I placed order that pt may return her cpap machine and sent it to [facility].

## 2016-12-01 IMAGING — DX DG LUMBAR SPINE 2-3V
3 series · 3 of 3 positions shown · non-contrast
Comparison: None.

CLINICAL DATA: Lower back pain for 1 week with radiation into both
hips, no known injury, initial encounter

EXAM:
LUMBAR SPINE - 2-3 VIEW

[l-spine ap]
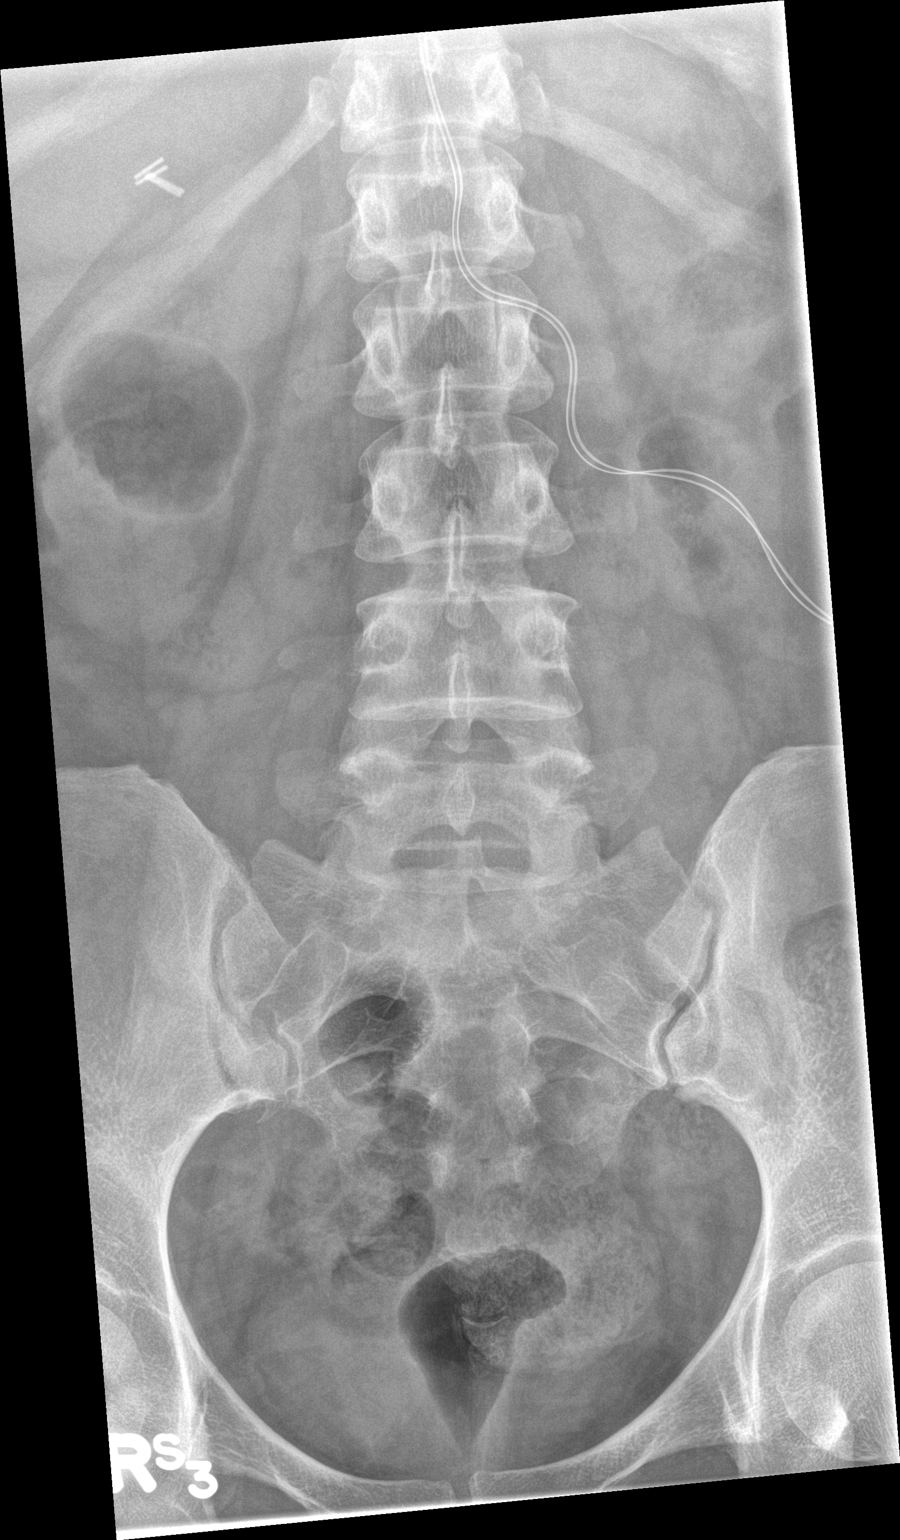

[l-spine lat]
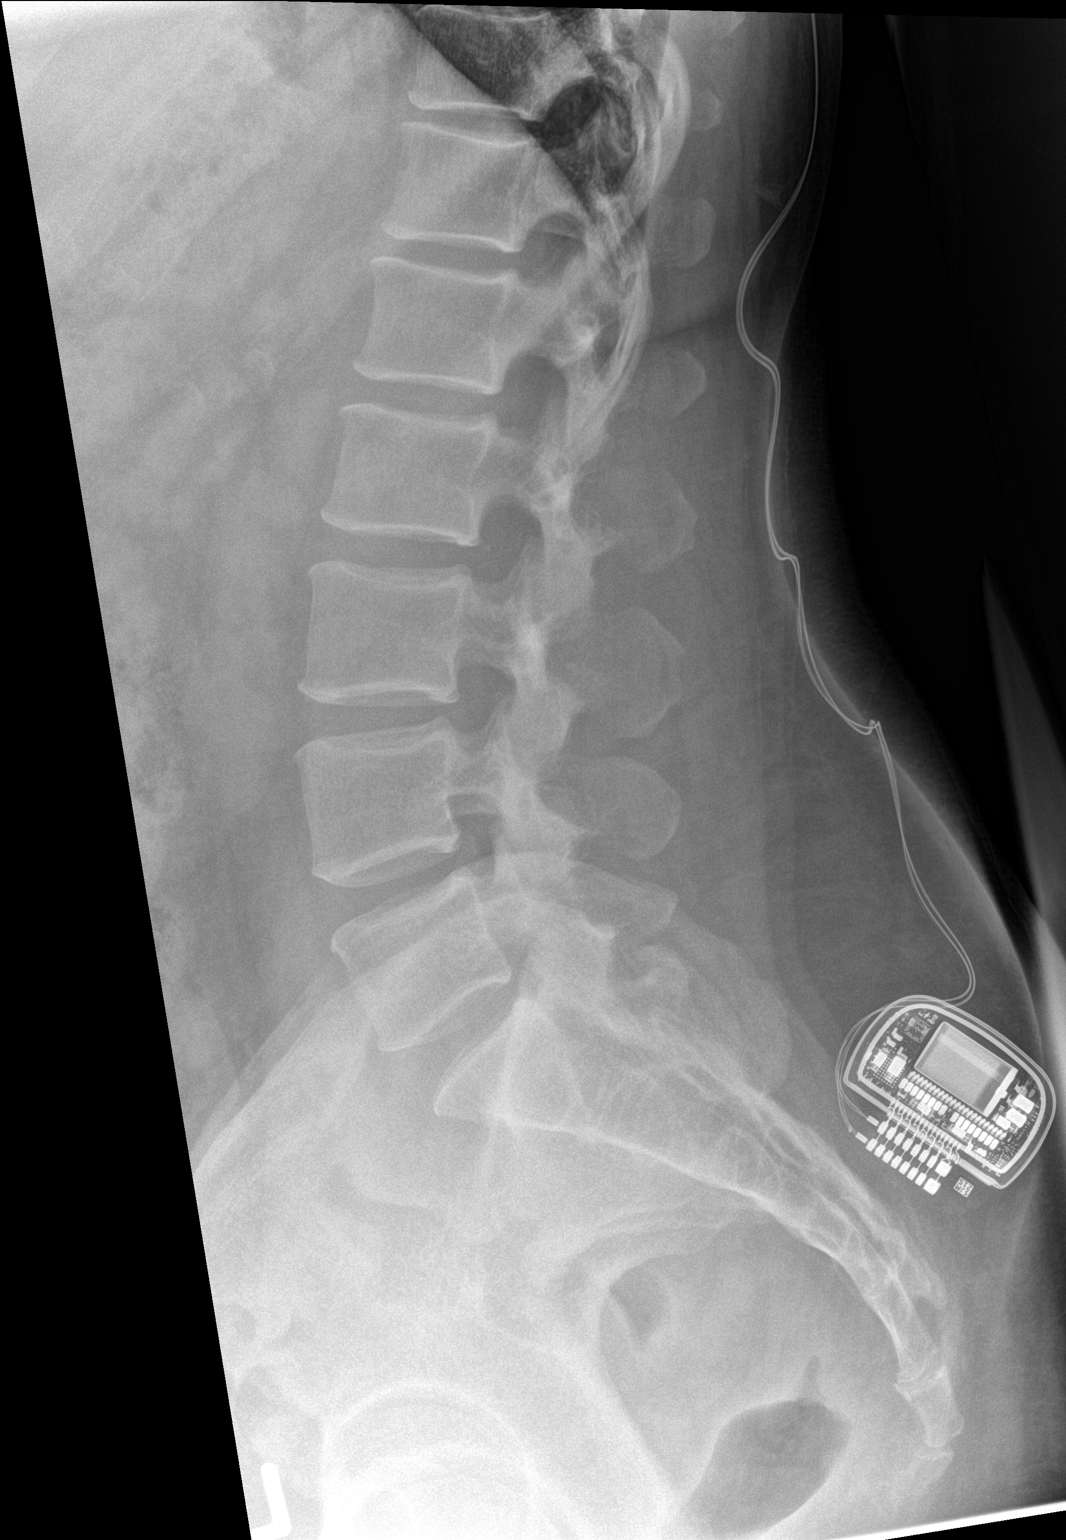

[l-spine spot]
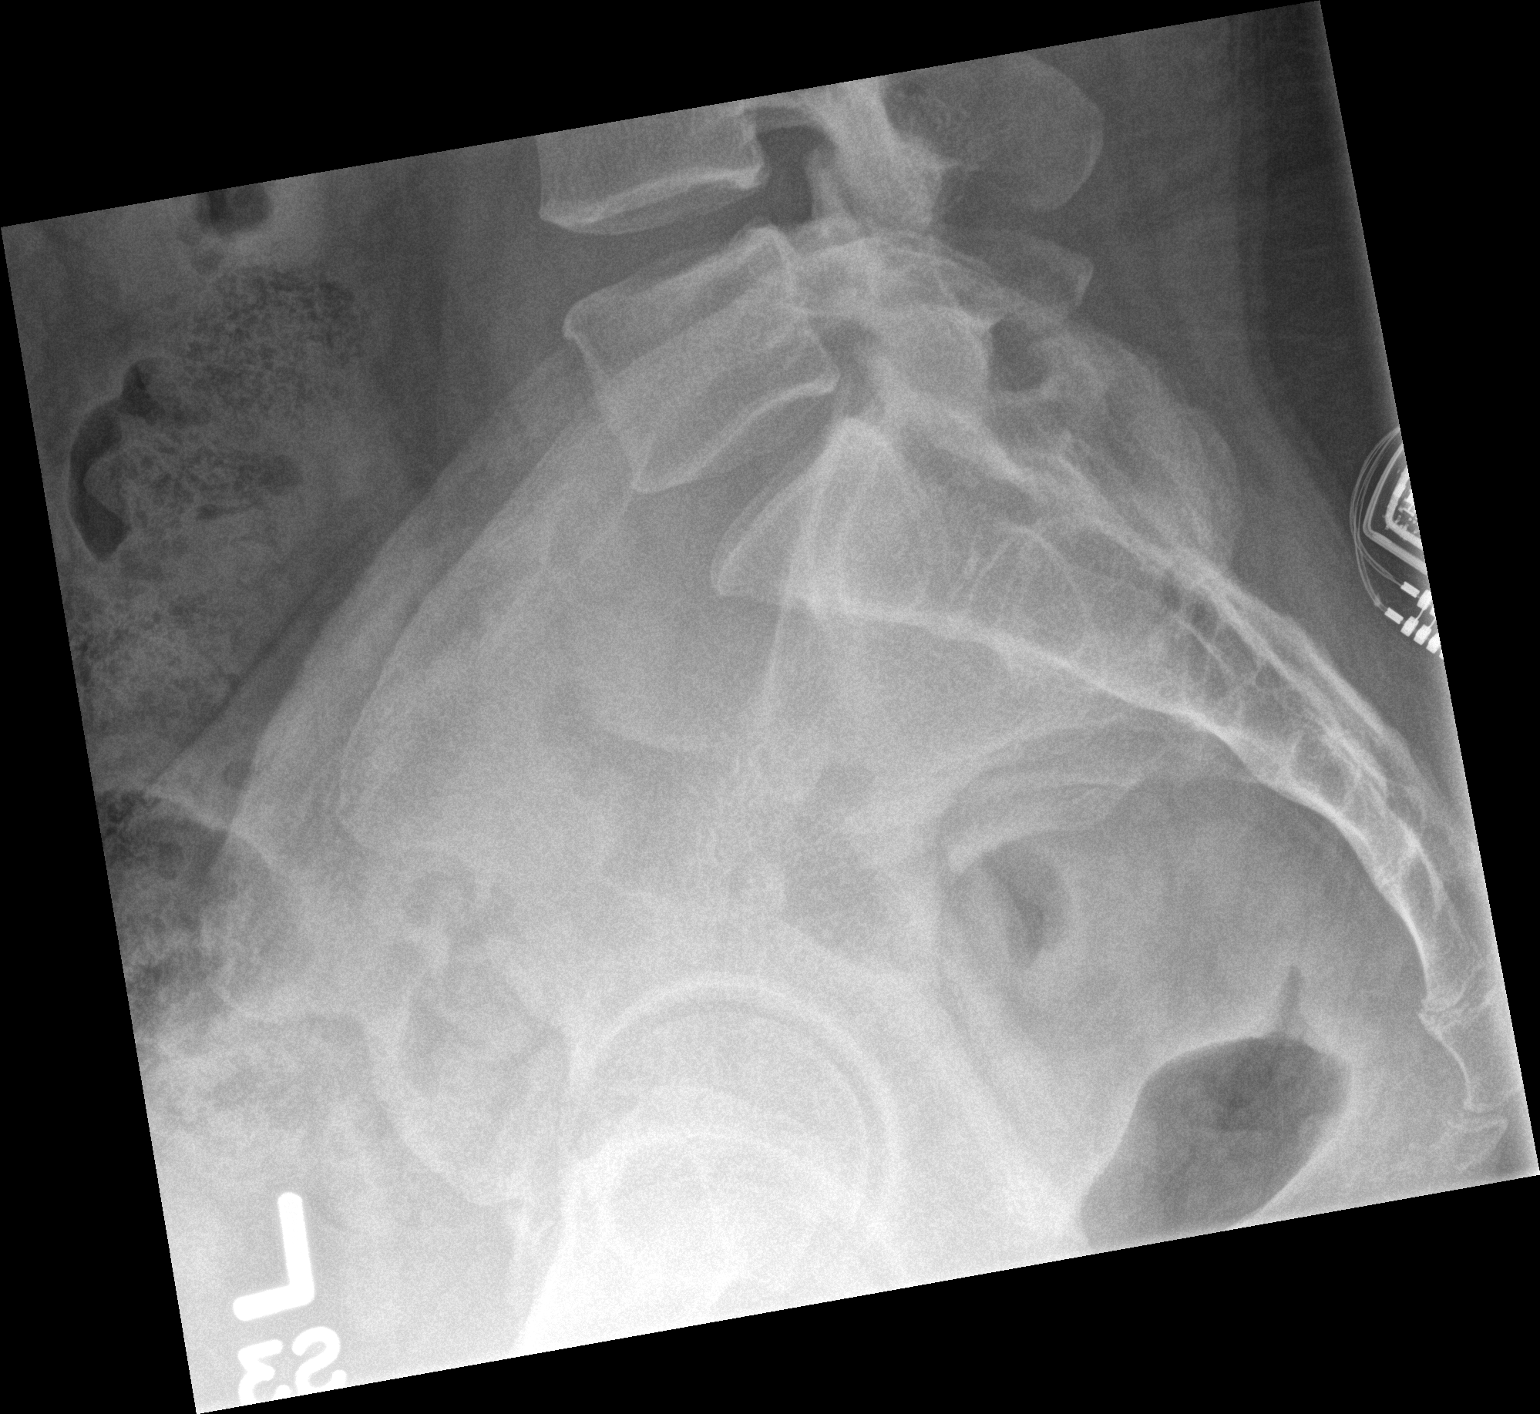

[3 of 3 positions shown; findings below may reference images not displayed]

FINDINGS: Five lumbar type vertebral bodies are well visualized. Vertebral
body height is well maintained. A spinal stimulator is noted in the
thoracic spine. No spondylolisthesis is seen. No soft tissue
abnormality is noted.
IMPRESSION: No acute abnormality noted.

## 2017-06-28 ENCOUNTER — Encounter: Payer: Self-pay | Admitting: Physical Medicine & Rehabilitation

## 2017-07-05 ENCOUNTER — Encounter: Payer: Commercial Managed Care - PPO | Attending: Physical Medicine & Rehabilitation

## 2017-07-05 ENCOUNTER — Encounter: Payer: Self-pay | Admitting: Physical Medicine & Rehabilitation

## 2017-07-05 ENCOUNTER — Ambulatory Visit (HOSPITAL_BASED_OUTPATIENT_CLINIC_OR_DEPARTMENT_OTHER): Payer: Commercial Managed Care - PPO | Admitting: Physical Medicine & Rehabilitation

## 2017-07-05 VITALS — BP 116/78 | HR 100

## 2017-07-05 DIAGNOSIS — G8929 Other chronic pain: Secondary | ICD-10-CM | POA: Insufficient documentation

## 2017-07-05 DIAGNOSIS — M797 Fibromyalgia: Secondary | ICD-10-CM | POA: Diagnosis not present

## 2017-07-05 DIAGNOSIS — G5692 Unspecified mononeuropathy of left upper limb: Secondary | ICD-10-CM

## 2017-07-05 DIAGNOSIS — M545 Low back pain, unspecified: Secondary | ICD-10-CM

## 2017-07-05 DIAGNOSIS — M792 Neuralgia and neuritis, unspecified: Secondary | ICD-10-CM
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
## 2017-07-17 ENCOUNTER — Encounter: Payer: Self-pay | Admitting: Physical Therapy

## 2017-07-17 ENCOUNTER — Ambulatory Visit: Payer: Commercial Managed Care - PPO | Attending: Physical Medicine & Rehabilitation | Admitting: Physical Therapy

## 2017-07-17 DIAGNOSIS — M62830 Muscle spasm of back: Secondary | ICD-10-CM | POA: Diagnosis present

## 2017-07-17 DIAGNOSIS — G8929 Other chronic pain: Secondary | ICD-10-CM | POA: Insufficient documentation

## 2017-07-17 DIAGNOSIS — M545 Low back pain: Secondary | ICD-10-CM | POA: Diagnosis present
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
## 2017-07-23 ENCOUNTER — Ambulatory Visit: Payer: Commercial Managed Care - PPO | Attending: Physical Medicine & Rehabilitation | Admitting: Physical Therapy

## 2017-07-23 ENCOUNTER — Encounter: Payer: Self-pay | Admitting: Physical Therapy

## 2017-07-23 DIAGNOSIS — G8929 Other chronic pain: Secondary | ICD-10-CM

## 2017-07-23 DIAGNOSIS — M545 Low back pain: Secondary | ICD-10-CM | POA: Diagnosis not present

## 2017-07-23 DIAGNOSIS — M62830 Muscle spasm of back: Secondary | ICD-10-CM
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
## 2017-07-25 ENCOUNTER — Ambulatory Visit: Payer: Commercial Managed Care - PPO | Admitting: Physical Therapy

## 2017-07-26 ENCOUNTER — Ambulatory Visit: Payer: Commercial Managed Care - PPO | Admitting: Physical Therapy

## 2017-07-26 DIAGNOSIS — M545 Low back pain: Secondary | ICD-10-CM | POA: Diagnosis not present

## 2017-07-26 DIAGNOSIS — G8929 Other chronic pain: Secondary | ICD-10-CM

## 2017-07-26 DIAGNOSIS — M62830 Muscle spasm of back: Secondary | ICD-10-CM

## 2017-07-26 NOTE — Therapy (Signed)
[place], [place], [address] Phone: [phone]   Fax:  [phone]  Physical Therapy Treatment  Patient Details  Name: [name] MRN: [id number] Date of Birth: [birth year] Referring Provider: Letta Pate   Encounter Date: 07/26/2017  PT End of Session - 07/26/17 0944    Visit Number  3    Date for PT Re-Evaluation  09/16/17    PT Start Time  0915    PT Stop Time  1005    PT Time Calculation (min)  50 min       Past Medical History:  Diagnosis Date  . Anxiety   . Asthma   . Bell's palsy   . Bipolar disorder ([place])   . Cancer ([place])    skin  . Chronic headache disorder 03/08/2015  . Collapsed lung    right lung  . COPD (chronic obstructive pulmonary disease) ([place])   . Depression   . Dislocated shoulder   . Dyslipidemia   . Fatigue   . Fibromyalgia   . GERD (gastroesophageal reflux disease)   . Hypercholesteremia   . Migraine   . Nutcracker esophagus   . Obsessive-compulsive disorder   . OSA (obstructive sleep apnea)   . 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 Sleep apnea   . Thyroid disease     Past Surgical History:  Procedure Laterality Date  . ABDOMINAL HYSTERECTOMY    . ans     2001, 2010  . ANS unit     . CESAREAN SECTION    . CESAREAN SECTION     2007  . DILATION AND CURETTAGE OF UTERUS     2007  . DNC    . gallbladder removed    . GALLBLADDER SURGERY     1998  . SHOULDER SURGERY  1998   plate placed  . stretching of esophagus    . Tens unit     04/2014    There were no vitals filed for this visit.  Subjective Assessment - 07/26/17 0919    Subjective  i think traction might of helped,would lke ti try again    Currently in Pain?  Yes    Pain Score  8     Pain Location  Back                      [facility] Adult PT Treatment/Exercise - 07/26/17 0001      Lumbar Exercises: Aerobic   Stationary Bike  Nustep L 4 6 min      Lumbar Exercises:  Standing   Other Standing Lumbar Exercises  red tband scap stab 10 3 ways    Other Standing Lumbar Exercises  standing hip ext and abd 10 each red tband      Lumbar Exercises: Supine   Clam  15 reps red tband and hip flex    Clam Limitations  add ball squeeze 15    Bridge  10 reps;3 seconds KTC and obl 10 each    Large Ball Abdominal Isometric  10 reps;3 seconds      Moist Heat Therapy   Number Minutes Moist Heat  15 Minutes    Moist Heat Location  Lumbar Spine      Traction   Type of Traction  Lumbar    Min (lbs)  25    Max (lbs)  45    Hold Time  30    Rest Time  10  Time  15               PT Short Term Goals - 07/26/17 0945      PT SHORT TERM GOAL #1   Title  give HEP    Baseline  focus on walking program and stretches    Status  Achieved        PT Long Term Goals - 07/17/17 1131      PT LONG TERM GOAL #1   Title  independent with HEP    Time  8    Period  Weeks    Status  New      PT LONG TERM GOAL #2   Title  increase lumbar extension 25%    Time  8    Period  Weeks    Status  New      PT LONG TERM GOAL #3   Title  decrease pain 25%    Time  8    Period  Weeks    Status  New      PT LONG TERM GOAL #4   Title  understand proper posture and body mechanics    Time  8    Period  Weeks    Status  New            Plan - 07/26/17 0946    Clinical Impression Statement  STG met . increased tolerance and ease of mvmt with ex today.     PT Treatment/Interventions  ADLs/Self Care Home Management;Electrical Stimulation;Cryotherapy;Moist Heat;Traction;Therapeutic activities;Therapeutic exercise;Neuromuscular re-education;Patient/family education;Manual techniques    PT Next Visit Plan  Core ex. add to walking and stretching HEP       Patient will benefit from skilled therapeutic intervention in order to improve the following deficits and impairments:  Decreased mobility, Decreased balance, Decreased strength, Postural dysfunction, Improper  body mechanics, Pain, Increased muscle spasms, Decreased range of motion  Visit Diagnosis: Chronic bilateral low back pain without sciatica  Muscle spasm of back     Problem List Patient Active Problem List   Diagnosis Date Noted  . Dyspnea 08/31/2016  . AKI (acute kidney injury) ([place]) 08/31/2016  . Chest pain 08/31/2016  . Chronic headache disorder 03/08/2015  . Chest pain syndrome 11/18/2014  . Dyslipidemia 11/18/2014  . Sleep apnea with use of continuous positive airway pressure (CPAP) 09/03/2013  . 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 Contusion of face, scalp, and neck except eye(s) 11/08/2012  . Mechanical complication of nervous system device, implant, and graft 11/08/2012  . Obstructive sleep apnea 11/08/2012  . Fibromyalgia 12/25/2011  . Depression, major 12/25/2011  . Bell's palsy 12/25/2011  . Chronic pain 12/25/2011  . GERD (gastroesophageal reflux disease) 12/25/2011    PAYSEUR,ANGIE PTA 07/26/2017, 9:48 AM  [place] [address] [place], [place], [address] Phone: [phone]   Fax:  [phone]  Name: [name] MRN: [id number] Date of Birth: Jul 06, 1971

## 2017-07-29 ENCOUNTER — Ambulatory Visit: Payer: Commercial Managed Care - PPO | Admitting: Physical Therapy

## 2017-07-29 ENCOUNTER — Encounter: Payer: Self-pay | Admitting: Physical Therapy

## 2017-07-29 DIAGNOSIS — M545 Low back pain: Secondary | ICD-10-CM

## 2017-07-29 DIAGNOSIS — M62830 Muscle spasm of back: Secondary | ICD-10-CM

## 2017-07-29 DIAGNOSIS — G8929 Other chronic pain: Secondary | ICD-10-CM

## 2017-07-29 NOTE — Therapy (Signed)
[facility] [address] [place], [place], [address] Phone: [phone]   Fax:  [phone]  Physical Therapy Treatment  Patient Details  Name: [name] MRN: [id number] Date of Birth: 1970-10-02 Referring Provider: Letta Pate   Encounter Date: 07/29/2017  PT End of Session - 07/29/17 1432    Visit Number  4    Date for PT Re-Evaluation  09/16/17    PT Start Time  6546    PT Stop Time  1439    PT Time Calculation (min)  54 min    Activity Tolerance  Patient tolerated treatment well    Behavior During Therapy  [facility] for tasks assessed/performed       Past Medical History:  Diagnosis Date  . Anxiety   . Asthma   . Bell's palsy   . Bipolar disorder ([place])   . Cancer ([place])    skin  . Chronic headache disorder 03/08/2015  . Collapsed lung    right lung  . COPD (chronic obstructive pulmonary disease) ([place])   . Depression   . Dislocated shoulder   . Dyslipidemia   . Fatigue   . Fibromyalgia   . GERD (gastroesophageal reflux disease)   . Hypercholesteremia   . Migraine   . Nutcracker esophagus   . Obsessive-compulsive disorder   . OSA (obstructive sleep apnea)   . 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 Sleep apnea   . Thyroid disease     Past Surgical History:  Procedure Laterality Date  . ABDOMINAL HYSTERECTOMY    . ans     2001, 2010  . ANS unit     . CESAREAN SECTION    . CESAREAN SECTION     2007  . DILATION AND CURETTAGE OF UTERUS     2007  . DNC    . gallbladder removed    . GALLBLADDER SURGERY     1998  . SHOULDER SURGERY  1998   plate placed  . stretching of esophagus    . Tens unit     04/2014    There were no vitals filed for this visit.  Subjective Assessment - 07/29/17 1347    Subjective  "terrible, I think it is the weather"    Currently in Pain?  Yes    Pain Score  9     Pain Location  -- Back, L shoulder, legs                      [facility] Adult PT  Treatment/Exercise - 07/29/17 0001      Lumbar Exercises: Aerobic   Stationary Bike  Nustep L 4 6 min      Lumbar Exercises: Machines for Strengthening   Cybex Knee Extension  5lb 2x10     Cybex Knee Flexion  20lb 2x10      Lumbar Exercises: Standing   Row  Theraband;Both;15 reps x2    Theraband Level (Row)  Level 3 (Green)    Shoulder Extension  15 reps;Theraband;Both x2    Theraband Level (Shoulder Extension)  Level 3 (Green)      Lumbar Exercises: Supine   Clam  15 reps    Clam Limitations  add ball squeeze 15    Bridge  10 reps;3 seconds    Large Ball Abdominal Isometric  10 reps;3 seconds    Other Supine Lumbar Exercises  LE on physo ball obl,K2C, small bridges      Moist  Heat Therapy   Number Minutes Moist Heat  15 Minutes    Moist Heat Location  Lumbar Spine      Traction   Type of Traction  Lumbar    Min (lbs)  25    Max (lbs)  45    Hold Time  30    Rest Time  10    Time  15               PT Short Term Goals - 07/26/17 0945      PT SHORT TERM GOAL #1   Title  give HEP    Baseline  focus on walking program and stretches    Status  Achieved        PT Long Term Goals - 07/17/17 1131      PT LONG TERM GOAL #1   Title  independent with HEP    Time  8    Period  Weeks    Status  New      PT LONG TERM GOAL #2   Title  increase lumbar extension 25%    Time  8    Period  Weeks    Status  New      PT LONG TERM GOAL #3   Title  decrease pain 25%    Time  8    Period  Weeks    Status  New      PT LONG TERM GOAL #4   Title  understand proper posture and body mechanics    Time  8    Period  Weeks    Status  New            Plan - 07/29/17 1433    Clinical Impression Statement  Pt with good carryover from late PT session with activity tolerance. Pt able to complete all exercises despite hight pain rating.     Rehab Potential  Fair    PT Duration  8 weeks    PT Treatment/Interventions  ADLs/Self Care Home Management;Electrical  Stimulation;Cryotherapy;Moist Heat;Traction;Therapeutic activities;Therapeutic exercise;Neuromuscular re-education;Patient/family education;Manual techniques;Orthotic Fit/Training    PT Next Visit Plan  assess HEP, Core ex. add to walking and stretching     PT Home Exercise Plan  LTR, POE       Patient will benefit from skilled therapeutic intervention in order to improve the following deficits and impairments:  Decreased mobility, Decreased balance, Decreased strength, Postural dysfunction, Improper body mechanics, Pain, Increased muscle spasms, Decreased range of motion  Visit Diagnosis: Muscle spasm of back  Chronic bilateral low back pain without sciatica     Problem List Patient Active Problem List   Diagnosis Date Noted  . Dyspnea 08/31/2016  . AKI (acute kidney injury) ([place]) 08/31/2016  . Chest pain 08/31/2016  . Chronic headache disorder 03/08/2015  . Chest pain syndrome 11/18/2014  . Dyslipidemia 11/18/2014  . Sleep apnea with use of continuous positive airway pressure (CPAP) 09/03/2013  . 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 Contusion of face, scalp, and neck except eye(s) 11/08/2012  . Mechanical complication of nervous system device, implant, and graft 11/08/2012  . Obstructive sleep apnea 11/08/2012  . Fibromyalgia 12/25/2011  . Depression, major 12/25/2011  . Bell's palsy 12/25/2011  . Chronic pain 12/25/2011  . GERD (gastroesophageal reflux disease) 12/25/2011    Scot Jun, PTA 07/29/2017, 2:49 PM  [address] [place], [place], [address] Phone: [phone]   Fax:  [phone]  Name: SHANTESE RAVEN MRN: [id number] Date of Birth: [birth year]

## 2017-07-31 ENCOUNTER — Ambulatory Visit: Payer: Commercial Managed Care - PPO | Admitting: Physical Therapy

## 2017-07-31 ENCOUNTER — Encounter: Payer: Self-pay | Admitting: Physical Therapy

## 2017-07-31 DIAGNOSIS — M545 Low back pain, unspecified: Secondary | ICD-10-CM

## 2017-07-31 DIAGNOSIS — M62830 Muscle spasm of back: Secondary | ICD-10-CM

## 2017-07-31 DIAGNOSIS — G8929 Other chronic pain: Secondary | ICD-10-CM

## 2017-07-31 NOTE — Therapy (Signed)
[place], [place], [address] Phone: [phone]   Fax:  [phone]  Physical Therapy Treatment  Patient Details  Name: [name] MRN: [id number] Date of Birth: 06-16-1971 Referring Provider: Letta Pate   Encounter Date: 07/31/2017  PT End of Session - 07/31/17 1143    Visit Number  5    Date for PT Re-Evaluation  09/16/17    PT Start Time  1055    PT Stop Time  1151    PT Time Calculation (min)  56 min    Activity Tolerance  Patient tolerated treatment well    Behavior During Therapy  [facility] for tasks assessed/performed       Past Medical History:  Diagnosis Date  . Anxiety   . Asthma   . Bell's palsy   . Bipolar disorder ([place])   . Cancer ([place])    skin  . Chronic headache disorder 03/08/2015  . Collapsed lung    right lung  . COPD (chronic obstructive pulmonary disease) ([place])   . Depression   . Dislocated shoulder   . Dyslipidemia   . Fatigue   . Fibromyalgia   . GERD (gastroesophageal reflux disease)   . Hypercholesteremia   . Migraine   . Nutcracker esophagus   . Obsessive-compulsive disorder   . OSA (obstructive sleep apnea)   . 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 Sleep apnea   . Thyroid disease     Past Surgical History:  Procedure Laterality Date  . ABDOMINAL HYSTERECTOMY    . ans     2001, 2010  . ANS unit     . CESAREAN SECTION    . CESAREAN SECTION     2007  . DILATION AND CURETTAGE OF UTERUS     2007  . DNC    . gallbladder removed    . GALLBLADDER SURGERY     1998  . SHOULDER SURGERY  1998   plate placed  . stretching of esophagus    . Tens unit     04/2014    There were no vitals filed for this visit.  Subjective Assessment - 07/31/17 1054    Subjective  "I hurt, last night my knees started hurting, and like the middle of my back"    Currently in Pain?  Yes    Pain Score  7     Pain Location  -- back and knees                       [facility] Adult PT Treatment/Exercise - 07/31/17 0001      Lumbar Exercises: Aerobic   Stationary Bike  Nustep L 4 6 min      Lumbar Exercises: Machines for Strengthening   Cybex Knee Extension  5lb x15 x10    Cybex Knee Flexion  20lb 2x15      Lumbar Exercises: Standing   Row  Theraband;Both;10 reps    Theraband Level (Row)  Level 3 (Green)    Shoulder Extension  15 reps;Theraband;Both x2    Theraband Level (Shoulder Extension)  Level 3 (Green)    Other Standing Lumbar Exercises  standing hip ext and abd 2x10 each      Lumbar Exercises: Supine   Bridge  10 reps;3 seconds    Large Ball Abdominal Isometric  3 seconds;15 reps    Other Supine Lumbar Exercises  LE on physo ball obl,K2C, small bridges  Traction   Min (lbs)  40    Max (lbs)  65    Hold Time  30    Rest Time  10    Time  15               PT Short Term Goals - 07/26/17 0945      PT SHORT TERM GOAL #1   Title  give HEP    Baseline  focus on walking program and stretches    Status  Achieved        PT Long Term Goals - 07/31/17 1058      PT LONG TERM GOAL #1   Title  independent with HEP    Status  Achieved      PT LONG TERM GOAL #4   Title  understand proper posture and body mechanics    Status  On-going            Plan - 07/31/17 1143    Clinical Impression Statement  Pt reports no issues with today's exercises, LE does fatigue quick with seated leg extensions and curls. Postural cues given to pt during standing hip exercises.     PT Frequency  2x / week    PT Duration  8 weeks    PT Treatment/Interventions  ADLs/Self Care Home Management;Electrical Stimulation;Cryotherapy;Moist Heat;Traction;Therapeutic activities;Therapeutic exercise;Neuromuscular re-education;Patient/family education;Manual techniques;Orthotic Fit/Training    PT Next Visit Plan  Core ex. add to walking and stretching        Patient will benefit from skilled therapeutic intervention  in order to improve the following deficits and impairments:  Decreased mobility, Decreased balance, Decreased strength, Postural dysfunction, Improper body mechanics, Pain, Increased muscle spasms, Decreased range of motion  Visit Diagnosis: Chronic bilateral low back pain without sciatica  Muscle spasm of back     Problem List Patient Active Problem List   Diagnosis Date Noted  . Dyspnea 08/31/2016  . AKI (acute kidney injury) ([place]) 08/31/2016  . Chest pain 08/31/2016  . Chronic headache disorder 03/08/2015  . Chest pain syndrome 11/18/2014  . Dyslipidemia 11/18/2014  . Sleep apnea with use of continuous positive airway pressure (CPAP) 09/03/2013  . 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 Contusion of face, scalp, and neck except eye(s) 11/08/2012  . Mechanical complication of nervous system device, implant, and graft 11/08/2012  . Obstructive sleep apnea 11/08/2012  . Fibromyalgia 12/25/2011  . Depression, major 12/25/2011  . Bell's palsy 12/25/2011  . Chronic pain 12/25/2011  . GERD (gastroesophageal reflux disease) 12/25/2011    Scot Jun, PTA 07/31/2017, 11:45 AM  [place], [place], [address] Phone: [phone]   Fax:  [phone]  Name: SHANNON BALTHAZAR MRN: [id number] Date of Birth: 09-Jul-1971

## 2017-08-02 ENCOUNTER — Other Ambulatory Visit: Payer: Self-pay

## 2017-08-02 ENCOUNTER — Encounter: Payer: Commercial Managed Care - PPO | Attending: Physical Medicine & Rehabilitation

## 2017-08-02 ENCOUNTER — Encounter: Payer: Self-pay | Admitting: Physical Medicine & Rehabilitation

## 2017-08-02 ENCOUNTER — Telehealth: Payer: Self-pay | Admitting: Physical Medicine & Rehabilitation

## 2017-08-02 ENCOUNTER — Ambulatory Visit (HOSPITAL_BASED_OUTPATIENT_CLINIC_OR_DEPARTMENT_OTHER): Payer: Commercial Managed Care - PPO | Admitting: Physical Medicine & Rehabilitation

## 2017-08-02 VITALS — BP 121/82 | HR 92

## 2017-08-02 DIAGNOSIS — G5692 Unspecified mononeuropathy of left upper limb: Secondary | ICD-10-CM | POA: Diagnosis not present

## 2017-08-02 DIAGNOSIS — G8929 Other chronic pain: Secondary | ICD-10-CM | POA: Insufficient documentation

## 2017-08-02 DIAGNOSIS — M797 Fibromyalgia: Secondary | ICD-10-CM | POA: Diagnosis not present

## 2017-08-02 DIAGNOSIS — M545 Low back pain: Secondary | ICD-10-CM

## 2017-08-02 DIAGNOSIS — M792 Neuralgia and neuritis, unspecified: Secondary | ICD-10-CM

## 2017-08-02 NOTE — Patient Instructions (Signed)
Expect some lumbar pain improvement about 6 weeks after regular exercise

## 2017-08-02 NOTE — Telephone Encounter (Signed)
Asked doctor which medication it was, stated that he recommended salon paws or icy hot patches to use on the area, called patient back and relayed information.

## 2017-08-02 NOTE — Progress Notes (Signed)
Subjective:    Patient ID: [name], female    DOB: 03/13/1971, 46 y.o.   MRN: [id number]  HPI  Chief complaint is left shoulder pain, chronic  Seen by orthopedics , Dr French Ana  Who has done ORIF Left shoulder for fracture 1998, has undergone a CT arthrogram as noted below.  Rotator cuff repair is intact there is some glenohumeral instability.  Reviewed CT of the cervical spine in 2016 which showed no evidence of foraminal or central canal stenosis.  There are mild degenerative changes C6-C7.  Nothing to explain shoulder pain The patient complains of pain with range of motion as well as to touch. She has a history of traumatic brain injury.  Low back pain is doing a little bit better after physical therapy, we discussed time course of recovery and spine injections as potential treatment if she does not respond as expected to physical therapy.  She has pain with standing and walking more so than with sitting.  This is suspicious for facet arthropathy mediated pain Pain Inventory Average Pain 9 Pain Right Now 9 My pain is sharp, burning, stabbing and aching  In the last 24 hours, has pain interfered with the following? General activity 7 Relation with others 7 Enjoyment of life 7 What TIME of day is your pain at its worst? evening Sleep (in general) Poor  Pain is worse with: walking, bending, sitting, standing and some activites Pain improves with: rest, therapy/exercise and pacing activities Relief from Meds: .  Mobility walk without assistance ability to climb steps?  yes do you drive?  yes  Function disabled: date disabled 2005  Neuro/Psych bladder control problems weakness tremor trouble walking spasms depression anxiety  Prior Studies Any changes since last visit?  no CLINICAL DATA:  Left shoulder pain. Remote history of fracture and prior rotator cuff repair  EXAM: CT ARTHROGRAPHY OF THE LEFT SHOULDER  TECHNIQUE: Multidetector CT imaging was  performed following the standard protocol after injection of dilute contrast into the joint.  COMPARISON:  Images from contrast injection reviewed.  FINDINGS: Tracks in the greater tuberosity consistent with history of rotator cuff repair are identified. The rotator cuff is intact. No contrast extravasates into the subacromial/subdeltoid bursa. There is no muscular atrophy. The long head of biceps tendon and biceps labrum appear intact. The humeral head is posteriorly subluxed on the glenoid compatible with glenohumeral joint instability. No ligament tear is identified.  The patient has a remote healed proximal humerus fracture in near anatomic position and alignment. Mild acromioclavicular degenerative change is seen. The acromion is type 2.  Imaged lung parenchyma is clear. Visualized ribs scratch the visualized thoracic spine, ribs and clavicle appear normal.  IMPRESSION: Status post rotator cuff repair. The rotator cuff is intact and there is no muscular atrophy present.  Posterior subluxation of the humeral head on the glenoid is compatible with glenohumeral joint instability.  Mild acromioclavicular osteoarthritis.  Intact glenoid labrum and long head of biceps.  Remote healed humerus fracture in near anatomic position and alignment.   Electronically Signed   By: Inge Rise M.D.   On: 12/03/2014 15:25 Physicians involved in your care Any changes since last visit?  no   Family History  Adopted: Yes  Problem Relation Age of Onset  . Depression Mother   . Dementia Mother   . Depression Maternal Grandmother   . Diabetes Father    Social History   Socioeconomic History  . Marital status: Married    Spouse name:  Not on file  . Number of children: 1  . Years of education: college  . Highest education level: Not on file  Social Needs  . Financial resource strain: Not on file  . Food insecurity - worry: Not on file  . Food insecurity -  inability: Not on file  . Transportation needs - medical: Not on file  . Transportation needs - non-medical: Not on file  Occupational History  . Occupation: disabled  Tobacco Use  . Smoking status: Former Smoker    Last attempt to quit: 06/25/2000    Years since quitting: 17.1  . Smokeless tobacco: Never Used  Substance and Sexual Activity  . Alcohol use: No  . Drug use: No  . Sexual activity: Not on file  Other Topics Concern  . Not on file  Social History Narrative   Caffeine - patient is trying to stop drinking caffeine (she drinks 1 "huge" cup of caffeine daily)   Patient is right handed.   Past Surgical History:  Procedure Laterality Date  . ABDOMINAL HYSTERECTOMY    . ans     2001, 2010  . ANS unit     . CESAREAN SECTION    . CESAREAN SECTION     2007  . DILATION AND CURETTAGE OF UTERUS     2007  . DNC    . ESOPHAGEAL MANOMETRY N/A 04/29/2015   Procedure: ESOPHAGEAL MANOMETRY (EM);  Surgeon: Arta Silence, MD;  Location: [facility];  Service: Endoscopy;  Laterality: N/A;  . gallbladder removed    . GALLBLADDER SURGERY     1998  . SHOULDER SURGERY  1998   plate placed  . stretching of esophagus    . Tens unit     04/2014   Past Medical History:  Diagnosis Date  . Anxiety   . Asthma   . Bell's palsy   . Bipolar disorder ([place])   . Cancer ([place])    skin  . Chronic headache disorder 03/08/2015  . Collapsed lung    right lung  . COPD (chronic obstructive pulmonary disease) ([place])   . Depression   . Dislocated shoulder   . Dyslipidemia   . Fatigue   . Fibromyalgia   . GERD (gastroesophageal reflux disease)   . Hypercholesteremia   . Migraine   . Nutcracker esophagus   . Obsessive-compulsive disorder   . OSA (obstructive sleep apnea)   . 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 Sleep apnea   . Thyroid disease    There were no vitals taken for this visit.  Opioid Risk Score:   Fall Risk Score:  `1  Depression screen PHQ 2/9  Depression screen PHQ  2/9 07/05/2017  Decreased Interest 3  Down, Depressed, Hopeless 3  PHQ - 2 Score 6  Altered sleeping 3  Tired, decreased energy 1  Change in appetite 0  Feeling bad or failure about yourself  1  Trouble concentrating 0  Moving slowly or fidgety/restless 0  Suicidal thoughts 0  PHQ-9 Score 11     Review of Systems  Constitutional: Negative.   HENT: Negative.   Eyes: Negative.   Respiratory: Negative.   Cardiovascular: Negative.   Gastrointestinal: Negative.   Endocrine: Negative.   Genitourinary: Negative.   Musculoskeletal: Negative.   Skin: Negative.   Allergic/Immunologic: Negative.   Neurological: Negative.   Hematological: Negative.   Psychiatric/Behavioral: Negative.   All other systems reviewed and are negative.      Objective:   Physical Exam  Constitutional: She is oriented to person, place, and time. She appears well-developed and well-nourished. No distress.  HENT:  Head: Normocephalic and atraumatic.  Eyes: Conjunctivae and EOM are normal. Pupils are equal, round, and reactive to light.  Neurological: She is alert and oriented to person, place, and time. She displays atrophy. She exhibits normal muscle tone. Gait normal.  Skin: She is not diaphoretic.  Nursing note and vitals reviewed. Tenderness to palpation in the lumbar spine area lumbar flexion is 75%, extension is 50% lateral bending is 50%  Negative O'Brien's test Positive empty can test negative Hawkins Tenderness over the acromioclavicular joint as well as over the anterior shoulder scar which is just medial to the anterior deltoid There is mild left shoulder subluxation. Motor strength of the left deltoid is 4 limited by pain4+ at the bicep tricep grip Right facial droop noted Speech with mild dysarthria       Assessment & Plan:  #1.  Left shoulder pain chronic neuropathic posttraumatic.  She has a history of traumatic brain injury  , Recommend peripheral nerve stimulation, avoid narcotic  recommend peripheral nerve stimulation, not a good candidate for narcotic analgesics due to history of brain injury as well as age Will make appeal to insurance  2.  Chronic low back pain likely multifactorial PT mainly working on range of motion will need to get into core strengthening as well.  She may expect some improvements in her low back pain in the next 6 weeks or so.  If no significant improvement may and if it from lumbar medial branch blocks.

## 2017-08-02 NOTE — Telephone Encounter (Signed)
Patient called stating that she had discussed some pain creams with Dr. Letta Pate, but he didn't write down any names or anything for her. Please advise

## 2017-08-05 ENCOUNTER — Ambulatory Visit: Payer: Commercial Managed Care - PPO | Admitting: Physical Therapy

## 2017-08-05 ENCOUNTER — Encounter: Payer: Self-pay | Admitting: Physical Therapy

## 2017-08-05 DIAGNOSIS — M62830 Muscle spasm of back: Secondary | ICD-10-CM

## 2017-08-05 DIAGNOSIS — G8929 Other chronic pain: Secondary | ICD-10-CM

## 2017-08-05 DIAGNOSIS — M545 Low back pain: Secondary | ICD-10-CM | POA: Diagnosis not present

## 2017-08-05 NOTE — Therapy (Signed)
[place], [place], [address] Phone: [phone]   Fax:  [phone]  Physical Therapy Treatment  Patient Details  Name: [name] MRN: [id number] Date of Birth: 08-14-1971 Referring Provider: Letta Pate   Encounter Date: 08/05/2017  PT End of Session - 08/05/17 1002    Visit Number  6    PT Start Time  0935    PT Stop Time  1017    PT Time Calculation (min)  42 min       Past Medical History:  Diagnosis Date  . Anxiety   . Asthma   . Bell's palsy   . Bipolar disorder ([place])   . Cancer ([place])    skin  . Chronic headache disorder 03/08/2015  . Collapsed lung    right lung  . COPD (chronic obstructive pulmonary disease) ([place])   . Depression   . Dislocated shoulder   . Dyslipidemia   . Fatigue   . Fibromyalgia   . GERD (gastroesophageal reflux disease)   . Hypercholesteremia   . Migraine   . Nutcracker esophagus   . Obsessive-compulsive disorder   . OSA (obstructive sleep apnea)   . 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 Sleep apnea   . Thyroid disease     Past Surgical History:  Procedure Laterality Date  . ABDOMINAL HYSTERECTOMY    . ans     2001, 2010  . ANS unit     . CESAREAN SECTION    . CESAREAN SECTION     2007  . DILATION AND CURETTAGE OF UTERUS     2007  . DNC    . ESOPHAGEAL MANOMETRY (EM) N/A 04/29/2015   Performed by Arta Silence, MD at [facility]  . gallbladder removed    . GALLBLADDER SURGERY     1998  . SHOULDER SURGERY  1998   plate placed  . stretching of esophagus    . Tens unit     04/2014    There were no vitals filed for this visit.  Subjective Assessment - 08/05/17 0927    Subjective  i do not see that therapy is helping , thought traction was but now not sure, MD thinks it may take awhile.    Currently in Pain?  Yes    Pain Score  9     Pain Location  Back                      [facility] Adult PT Treatment/Exercise -  08/05/17 0001      Lumbar Exercises: Aerobic   Stationary Bike  Nustep L 5 6 min      Lumbar Exercises: Machines for Strengthening   Cybex Lumbar Extension  black tband 2 sets 15    Cybex Knee Extension  10# 2 sets 10    Cybex Knee Flexion  25# 2 sets 10    Other Lumbar Machine Exercise   seated row 20# 2 sets 15      Lumbar Exercises: Standing   Other Standing Lumbar Exercises  standing hip ext and abd 2x10 each      Lumbar Exercises: Supine   Ab Set  15 reps;3 seconds    Clam  20 reps plus marching green tband    Clam Limitations  add ball squeeze 20    Bridge  15 reps;3 seconds with abd    Other Supine Lumbar Exercises  wt ball core stab  Other Supine Lumbar Exercises  LE on physo ball obl,K2C,  bridge 15 each               PT Short Term Goals - 07/26/17 0945      PT SHORT TERM GOAL #1   Title  give HEP    Baseline  focus on walking program and stretches    Status  Achieved        PT Long Term Goals - 07/31/17 1058      PT LONG TERM GOAL #1   Title  independent with HEP    Status  Achieved      PT LONG TERM GOAL #4   Title  understand proper posture and body mechanics    Status  On-going            Plan - 08/05/17 1002    Clinical Impression Statement  pt reports no significant changes from eval so focus on core strength next several sessins to see if she starts to see some pain relief. Cuing needed with some ex for correct stab with ex.    PT Treatment/Interventions  ADLs/Self Care Home Management;Electrical Stimulation;Cryotherapy;Moist Heat;Traction;Therapeutic activities;Therapeutic exercise;Neuromuscular re-education;Patient/family education;Manual techniques;Orthotic Fit/Training    PT Next Visit Plan  CORE strengthening       Patient will benefit from skilled therapeutic intervention in order to improve the following deficits and impairments:  Decreased mobility, Decreased balance, Decreased strength, Postural dysfunction, Improper body  mechanics, Pain, Increased muscle spasms, Decreased range of motion  Visit Diagnosis: Chronic bilateral low back pain without sciatica  Muscle spasm of back     Problem List Patient Active Problem List   Diagnosis Date Noted  . Dyspnea 08/31/2016  . AKI (acute kidney injury) ([place]) 08/31/2016  . Chest pain 08/31/2016  . Chronic headache disorder 03/08/2015  . Chest pain syndrome 11/18/2014  . Dyslipidemia 11/18/2014  . Sleep apnea with use of continuous positive airway pressure (CPAP) 09/03/2013  . 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 Contusion of face, scalp, and neck except eye(s) 11/08/2012  . Mechanical complication of nervous system device, implant, and graft 11/08/2012  . Obstructive sleep apnea 11/08/2012  . Fibromyalgia 12/25/2011  . Depression, major 12/25/2011  . Bell's palsy 12/25/2011  . Chronic pain 12/25/2011  . GERD (gastroesophageal reflux disease) 12/25/2011    Summit Arroyave,ANGIE PTA 08/05/2017, 10:05 AM  [place], [place], [address] Phone: [phone]   Fax:  [phone]  Name: [name] MRN: [id number] Date of Birth: 1971/07/17

## 2017-08-13 ENCOUNTER — Ambulatory Visit: Payer: Commercial Managed Care - PPO | Admitting: Physical Therapy

## 2017-08-13 ENCOUNTER — Encounter: Payer: Self-pay | Admitting: Physical Therapy

## 2017-08-13 DIAGNOSIS — G8929 Other chronic pain: Secondary | ICD-10-CM

## 2017-08-13 DIAGNOSIS — M545 Low back pain: Secondary | ICD-10-CM

## 2017-08-13 DIAGNOSIS — M62830 Muscle spasm of back: Secondary | ICD-10-CM

## 2017-08-13 NOTE — Therapy (Signed)
[place], [place], [address] Phone: [phone]   Fax:  [phone]  Physical Therapy Treatment  Patient Details  Name: [name] MRN: [id number] Date of Birth: 1971/07/05 Referring Provider: Letta Pate   Encounter Date: 08/13/2017  PT End of Session - 08/13/17 1144    Visit Number  7    Date for PT Re-Evaluation  09/16/17    PT Start Time  1100    PT Stop Time  1154    PT Time Calculation (min)  54 min    Activity Tolerance  Patient tolerated treatment well    Behavior During Therapy  [facility] for tasks assessed/performed       Past Medical History:  Diagnosis Date  . Anxiety   . Asthma   . Bell's palsy   . Bipolar disorder ([place])   . Cancer ([place])    skin  . Chronic headache disorder 03/08/2015  . Collapsed lung    right lung  . COPD (chronic obstructive pulmonary disease) ([place])   . Depression   . Dislocated shoulder   . Dyslipidemia   . Fatigue   . Fibromyalgia   . GERD (gastroesophageal reflux disease)   . Hypercholesteremia   . Migraine   . Nutcracker esophagus   . Obsessive-compulsive disorder   . OSA (obstructive sleep apnea)   . 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 Sleep apnea   . Thyroid disease     Past Surgical History:  Procedure Laterality Date  . ABDOMINAL HYSTERECTOMY    . ans     2001, 2010  . ANS unit     . CESAREAN SECTION    . CESAREAN SECTION     2007  . DILATION AND CURETTAGE OF UTERUS     2007  . DNC    . ESOPHAGEAL MANOMETRY N/A 04/29/2015   Procedure: ESOPHAGEAL MANOMETRY (EM);  Surgeon: Arta Silence, MD;  Location: [facility];  Service: Endoscopy;  Laterality: N/A;  . gallbladder removed    . GALLBLADDER SURGERY     1998  . SHOULDER SURGERY  1998   plate placed  . stretching of esophagus    . Tens unit     04/2014    There were no vitals filed for this visit.  Subjective Assessment - 08/13/17 1108    Subjective  "Back and legs  are killing me for the last 2 days"    Currently in Pain?  Yes    Pain Score  7     Pain Location  Back                      [facility] Adult PT Treatment/Exercise - 08/13/17 0001      Lumbar Exercises: Aerobic   Tread Mill  X8 min       Lumbar Exercises: Machines for Strengthening   Cybex Lumbar Extension  black tband 2 sets 15    Cybex Knee Extension  10# 2 sets 10    Cybex Knee Flexion  25# 2 sets 15    Other Lumbar Machine Exercise   seated row 20# 2 sets 15      Lumbar Exercises: Standing   Other Standing Lumbar Exercises  standing hip ext and abd 2x10 each      Lumbar Exercises: Supine   Bridge  15 reps;3 seconds red abd    Large Ball Abdominal Isometric  3 seconds;15 reps    Other  Supine Lumbar Exercises  bilat SLR x15    Other Supine Lumbar Exercises  LE on physo ball obl,K2C,  bridge 15 each      Moist Heat Therapy   Number Minutes Moist Heat  10 Minutes    Moist Heat Location  Lumbar Spine               PT Short Term Goals - 07/26/17 0945      PT SHORT TERM GOAL #1   Title  give HEP    Baseline  focus on walking program and stretches    Status  Achieved        PT Long Term Goals - 07/31/17 1058      PT LONG TERM GOAL #1   Title  independent with HEP    Status  Achieved      PT LONG TERM GOAL #4   Title  understand proper posture and body mechanics    Status  On-going            Plan - 08/13/17 1144    Clinical Impression Statement  Continued with core strength ands postural stability, no reports of increase pain with today's activity. LE fatigues quick with curls and extensions.    Rehab Potential  Fair    PT Frequency  2x / week    PT Duration  8 weeks    PT Treatment/Interventions  ADLs/Self Care Home Management;Electrical Stimulation;Cryotherapy;Moist Heat;Traction;Therapeutic activities;Therapeutic exercise;Neuromuscular re-education;Patient/family education;Manual techniques;Orthotic Fit/Training    PT Next Visit Plan   CORE strengthening       Patient will benefit from skilled therapeutic intervention in order to improve the following deficits and impairments:  Decreased mobility, Decreased balance, Decreased strength, Postural dysfunction, Improper body mechanics, Pain, Increased muscle spasms, Decreased range of motion  Visit Diagnosis: Muscle spasm of back  Chronic bilateral low back pain without sciatica     Problem List Patient Active Problem List   Diagnosis Date Noted  . Dyspnea 08/31/2016  . AKI (acute kidney injury) ([place]) 08/31/2016  . Chest pain 08/31/2016  . Chronic headache disorder 03/08/2015  . Chest pain syndrome 11/18/2014  . Dyslipidemia 11/18/2014  . Sleep apnea with use of continuous positive airway pressure (CPAP) 09/03/2013  . 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 Contusion of face, scalp, and neck except eye(s) 11/08/2012  . Mechanical complication of nervous system device, implant, and graft 11/08/2012  . Obstructive sleep apnea 11/08/2012  . Fibromyalgia 12/25/2011  . Depression, major 12/25/2011  . Bell's palsy 12/25/2011  . Chronic pain 12/25/2011  . GERD (gastroesophageal reflux disease) 12/25/2011    Scot Jun, PTA 08/13/2017, 11:45 AM  [place], [place], [address] Phone: [phone]   Fax:  [phone]  Name: [name] MRN: [id number] Date of Birth: [birth year]

## 2017-08-15 ENCOUNTER — Ambulatory Visit: Payer: Commercial Managed Care - PPO | Admitting: Physical Therapy

## 2017-08-15 ENCOUNTER — Encounter: Payer: Self-pay | Admitting: Physical Therapy

## 2017-08-15 DIAGNOSIS — M62830 Muscle spasm of back: Secondary | ICD-10-CM

## 2017-08-15 DIAGNOSIS — M545 Low back pain, unspecified: Secondary | ICD-10-CM

## 2017-08-15 DIAGNOSIS — G8929 Other chronic pain: Secondary | ICD-10-CM

## 2017-08-15 NOTE — Therapy (Signed)
[place], [place], [address] Phone: [phone]   Fax:  [phone]  Physical Therapy Treatment  Patient Details  Name: [name] MRN: [id number] Date of Birth: May 19, 1971 Referring Provider: Letta Pate   Encounter Date: 08/15/2017  PT End of Session - 08/15/17 1137    Visit Number  8    Date for PT Re-Evaluation  09/16/17    PT Start Time  1100    PT Stop Time  1142    PT Time Calculation (min)  42 min    Activity Tolerance  Patient tolerated treatment well    Behavior During Therapy  [facility] for tasks assessed/performed       Past Medical History:  Diagnosis Date  . Anxiety   . Asthma   . Bell's palsy   . Bipolar disorder ([place])   . Cancer ([place])    skin  . Chronic headache disorder 03/08/2015  . Collapsed lung    right lung  . COPD (chronic obstructive pulmonary disease) ([place])   . Depression   . Dislocated shoulder   . Dyslipidemia   . Fatigue   . Fibromyalgia   . GERD (gastroesophageal reflux disease)   . Hypercholesteremia   . Migraine   . Nutcracker esophagus   . Obsessive-compulsive disorder   . OSA (obstructive sleep apnea)   . 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 Sleep apnea   . Thyroid disease     Past Surgical History:  Procedure Laterality Date  . ABDOMINAL HYSTERECTOMY    . ans     2001, 2010  . ANS unit     . CESAREAN SECTION    . CESAREAN SECTION     2007  . DILATION AND CURETTAGE OF UTERUS     2007  . DNC    . ESOPHAGEAL MANOMETRY N/A 04/29/2015   Procedure: ESOPHAGEAL MANOMETRY (EM);  Surgeon: Arta Silence, MD;  [facility];  Service: Endoscopy;  Laterality: N/A;  . gallbladder removed    . GALLBLADDER SURGERY     1998  . SHOULDER SURGERY  1998   plate placed  . stretching of esophagus    . Tens unit     04/2014    There were no vitals filed for this visit.  Subjective Assessment - 08/15/17 1100    Subjective  Pt reports that  she has been cleaning under the TV causing her to hurt    Currently in Pain?  Yes    Pain Score  8     Pain Location  Back    Pain Orientation  Right;Mid;Lower         [facility] PT Assessment - 08/15/17 0001      AROM   Overall AROM Comments  Lumbar ROM WFL                  [facility] Adult PT Treatment/Exercise - 08/15/17 0001      Lumbar Exercises: Aerobic   Stationary Bike  Nustep L 5 6 min      Lumbar Exercises: Machines for Strengthening   Cybex Lumbar Extension  black tband 2 sets 15    Cybex Knee Extension  10# 2 sets 10    Cybex Knee Flexion  25# 2 sets 15    Leg Press  20lb 2x10     Other Lumbar Machine Exercise   seated row 25# 2 sets 10.      Lumbar Exercises: Supine  Large Ball Abdominal Isometric  3 seconds;15 reps    Other Supine Lumbar Exercises  bilat SLR x15    Other Supine Lumbar Exercises  LE on physo ball obl,K2C,  bridge 15 each      Knee/Hip Exercises: Seated   Sit to Sand  2 sets;10 reps;without UE support pressed with yellow ball               PT Short Term Goals - 07/26/17 0945      PT SHORT TERM GOAL #1   Title  give HEP    Baseline  focus on walking program and stretches    Status  Achieved        PT Long Term Goals - 07/31/17 1058      PT LONG TERM GOAL #1   Title  independent with HEP    Status  Achieved      PT LONG TERM GOAL #4   Title  understand proper posture and body mechanics    Status  On-going            Plan - 08/15/17 1138    Clinical Impression Statement  Pt lumbar ROM has increased. No reports of increase pain during or after today's treatment, Pt able to complete all of today's activities despite high pain ratting. Pt abel to transfer from supine to sit without issue    Rehab Potential  Fair    PT Frequency  2x / week    PT Duration  8 weeks    PT Treatment/Interventions  ADLs/Self Care Home Management;Electrical Stimulation;Cryotherapy;Moist Heat;Traction;Therapeutic activities;Therapeutic  exercise;Neuromuscular re-education;Patient/family education;Manual techniques;Orthotic Fit/Training    PT Next Visit Plan  CORE strengthening       Patient will benefit from skilled therapeutic intervention in order to improve the following deficits and impairments:  Decreased mobility, Decreased balance, Decreased strength, Postural dysfunction, Improper body mechanics, Pain, Increased muscle spasms, Decreased range of motion  Visit Diagnosis: Muscle spasm of back  Chronic bilateral low back pain without sciatica     Problem List Patient Active Problem List   Diagnosis Date Noted  . Dyspnea 08/31/2016  . AKI (acute kidney injury) ([place]) 08/31/2016  . Chest pain 08/31/2016  . Chronic headache disorder 03/08/2015  . Chest pain syndrome 11/18/2014  . Dyslipidemia 11/18/2014  . Sleep apnea with use of continuous positive airway pressure (CPAP) 09/03/2013  . 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 Contusion of face, scalp, and neck except eye(s) 11/08/2012  . Mechanical complication of nervous system device, implant, and graft 11/08/2012  . Obstructive sleep apnea 11/08/2012  . Fibromyalgia 12/25/2011  . Depression, major 12/25/2011  . Bell's palsy 12/25/2011  . Chronic pain 12/25/2011  . GERD (gastroesophageal reflux disease) 12/25/2011    Scot Jun, PTA 08/15/2017, 11:40 AM  [place], [place], [address] Phone: [phone]   Fax:  [phone]  Name: [name] MRN: [id number] Date of Birth: 05-Sep-1971

## 2017-08-20 ENCOUNTER — Encounter: Payer: Self-pay | Admitting: Physical Therapy

## 2017-08-20 ENCOUNTER — Ambulatory Visit: Payer: Commercial Managed Care - PPO | Attending: Physical Medicine & Rehabilitation | Admitting: Physical Therapy

## 2017-08-20 DIAGNOSIS — G8929 Other chronic pain: Secondary | ICD-10-CM | POA: Diagnosis present

## 2017-08-20 DIAGNOSIS — M62830 Muscle spasm of back: Secondary | ICD-10-CM | POA: Diagnosis present

## 2017-08-20 DIAGNOSIS — M545 Low back pain, unspecified: Secondary | ICD-10-CM

## 2017-08-20 NOTE — Therapy (Signed)
[place], [place], [address] Phone: [phone]   Fax:  [phone]  Physical Therapy Treatment  Patient Details  Name: [name] MRN: [id number] Date of Birth: 1970-12-17 Referring Provider: Letta Pate   Encounter Date: 08/20/2017  PT End of Session - 08/20/17 1220    Visit Number  9    Date for PT Re-Evaluation  09/16/17    PT Start Time  1137    PT Stop Time  1220    PT Time Calculation (min)  43 min    Activity Tolerance  Patient tolerated treatment well    Behavior During Therapy  [facility] for tasks assessed/performed       Past Medical History:  Diagnosis Date  . Anxiety   . Asthma   . Bell's palsy   . Bipolar disorder ([place])   . Cancer ([place])    skin  . Chronic headache disorder 03/08/2015  . Collapsed lung    right lung  . COPD (chronic obstructive pulmonary disease) ([place])   . Depression   . Dislocated shoulder   . Dyslipidemia   . Fatigue   . Fibromyalgia   . GERD (gastroesophageal reflux disease)   . Hypercholesteremia   . Migraine   . Nutcracker esophagus   . Obsessive-compulsive disorder   . OSA (obstructive sleep apnea)   . 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 Sleep apnea   . Thyroid disease     Past Surgical History:  Procedure Laterality Date  . ABDOMINAL HYSTERECTOMY    . ans     2001, 2010  . ANS unit     . CESAREAN SECTION    . CESAREAN SECTION     2007  . DILATION AND CURETTAGE OF UTERUS     2007  . DNC    . ESOPHAGEAL MANOMETRY N/A 04/29/2015   Procedure: ESOPHAGEAL MANOMETRY (EM);  Surgeon: Arta Silence, MD;  [facility]: [facility];  Service: Endoscopy;  Laterality: N/A;  . gallbladder removed    . GALLBLADDER SURGERY     1998  . SHOULDER SURGERY  1998   plate placed  . stretching of esophagus    . Tens unit     04/2014    There were no vitals filed for this visit.  Subjective Assessment - 08/20/17 1141    Subjective  "My back is  [place], mu legs not so much, because  walked today"    Currently in Pain?  Yes    Pain Score  6     Pain Orientation  Right;Left;Mid;Upper;Lower                      [facility] Adult PT Treatment/Exercise - 08/20/17 0001      Lumbar Exercises: Aerobic   Stationary Bike  Nustep L 5 6 min      Lumbar Exercises: Machines for Strengthening   Cybex Lumbar Extension  black tband 2 sets 15    Cybex Knee Extension  10# 2 sets 10    Cybex Knee Flexion  25# 2 sets 15    Leg Press  30lb 2x10     Other Lumbar Machine Exercise   seated row 25# 2 sets 15    Other Lumbar Machine Exercise  lats 25lb 2x10       Lumbar Exercises: Standing   Other Standing Lumbar Exercises  shoulder ext pulley 10lb -      Lumbar Exercises: Supine  Bridge  15 reps;3 seconds    Large Ball Abdominal Isometric  3 seconds;15 reps    Other Supine Lumbar Exercises  bilat SLR x15    Other Supine Lumbar Exercises  LE on physo ball obl,K2C,  bridge 15 each               PT Short Term Goals - 07/26/17 0945      PT SHORT TERM GOAL #1   Title  give HEP    Baseline  focus on walking program and stretches    Status  Achieved        PT Long Term Goals - 07/31/17 1058      PT LONG TERM GOAL #1   Title  independent with HEP    Status  Achieved      PT LONG TERM GOAL #4   Title  understand proper posture and body mechanics    Status  On-going            Plan - 08/20/17 1221    Clinical Impression Statement  Continues with core and postural strengthening. Again high pain rating but able to complete all of today's activities.     Rehab Potential  Fair    PT Frequency  2x / week    PT Duration  8 weeks    PT Treatment/Interventions  ADLs/Self Care Home Management;Electrical Stimulation;Cryotherapy;Moist Heat;Traction;Therapeutic activities;Therapeutic exercise;Neuromuscular re-education;Patient/family education;Manual techniques;Orthotic Fit/Training    PT Next Visit Plan  CORE strengthening        Patient will benefit from skilled therapeutic intervention in order to improve the following deficits and impairments:  Decreased mobility, Decreased balance, Decreased strength, Postural dysfunction, Improper body mechanics, Pain, Increased muscle spasms, Decreased range of motion  Visit Diagnosis: Chronic bilateral low back pain without sciatica  Muscle spasm of back     Problem List Patient Active Problem List   Diagnosis Date Noted  . Dyspnea 08/31/2016  . AKI (acute kidney injury) ([place]) 08/31/2016  . Chest pain 08/31/2016  . Chronic headache disorder 03/08/2015  . Chest pain syndrome 11/18/2014  . Dyslipidemia 11/18/2014  . Sleep apnea with use of continuous positive airway pressure (CPAP) 09/03/2013  . 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 Contusion of face, scalp, and neck except eye(s) 11/08/2012  . Mechanical complication of nervous system device, implant, and graft 11/08/2012  . Obstructive sleep apnea 11/08/2012  . Fibromyalgia 12/25/2011  . Depression, major 12/25/2011  . Bell's palsy 12/25/2011  . Chronic pain 12/25/2011  . GERD (gastroesophageal reflux disease) 12/25/2011    Scot Jun, PTA 08/20/2017, 12:22 PM  [place], [place], [address] Phone: [phone]   Fax:  [phone]  Name: ELEAH LAHAIE MRN: [id number] Date of Birth: 01-Jan-1971

## 2017-08-22 ENCOUNTER — Ambulatory Visit: Payer: Commercial Managed Care - PPO | Admitting: Physical Therapy

## 2017-08-28 ENCOUNTER — Ambulatory Visit: Payer: Commercial Managed Care - PPO | Admitting: Physical Therapy

## 2017-08-28 ENCOUNTER — Encounter: Payer: Self-pay | Admitting: Physical Therapy

## 2017-08-28 DIAGNOSIS — M545 Low back pain: Secondary | ICD-10-CM | POA: Diagnosis not present

## 2017-08-28 DIAGNOSIS — G8929 Other chronic pain: Secondary | ICD-10-CM

## 2017-08-28 DIAGNOSIS — M62830 Muscle spasm of back: Secondary | ICD-10-CM
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
## 2017-08-30 ENCOUNTER — Encounter: Payer: Self-pay | Admitting: Physical Medicine & Rehabilitation

## 2017-08-30 ENCOUNTER — Other Ambulatory Visit: Payer: Self-pay

## 2017-08-30 ENCOUNTER — Encounter: Payer: Commercial Managed Care - PPO | Attending: Physical Medicine & Rehabilitation

## 2017-08-30 ENCOUNTER — Ambulatory Visit (HOSPITAL_BASED_OUTPATIENT_CLINIC_OR_DEPARTMENT_OTHER): Payer: Commercial Managed Care - PPO | Admitting: Physical Medicine & Rehabilitation

## 2017-08-30 VITALS — BP 124/84 | HR 107

## 2017-08-30 DIAGNOSIS — G8929 Other chronic pain: Secondary | ICD-10-CM | POA: Diagnosis not present

## 2017-08-30 DIAGNOSIS — M545 Low back pain: Secondary | ICD-10-CM | POA: Insufficient documentation

## 2017-08-30 DIAGNOSIS — G5692 Unspecified mononeuropathy of left upper limb: Secondary | ICD-10-CM | POA: Diagnosis not present

## 2017-08-30 DIAGNOSIS — M792 Neuralgia and neuritis, unspecified: Secondary | ICD-10-CM

## 2017-08-30 NOTE — Patient Instructions (Signed)
Please have therapy try Dry needling to muscles of upper back
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
## 2017-09-03 ENCOUNTER — Ambulatory Visit: Payer: Commercial Managed Care - PPO | Admitting: Physical Therapy

## 2017-09-03 ENCOUNTER — Encounter: Payer: Self-pay | Admitting: Physical Therapy

## 2017-09-03 DIAGNOSIS — M545 Low back pain, unspecified: Secondary | ICD-10-CM

## 2017-09-03 DIAGNOSIS — G8929 Other chronic pain: Secondary | ICD-10-CM

## 2017-09-03 DIAGNOSIS — M62830 Muscle spasm of back: Secondary | ICD-10-CM
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
## 2017-09-04 ENCOUNTER — Ambulatory Visit: Payer: Commercial Managed Care - PPO | Admitting: Physical Therapy

## 2017-09-11 ENCOUNTER — Encounter: Payer: Self-pay | Admitting: Physical Therapy

## 2017-09-11 ENCOUNTER — Ambulatory Visit: Payer: Commercial Managed Care - PPO | Admitting: Physical Therapy

## 2017-09-11 DIAGNOSIS — M545 Low back pain: Secondary | ICD-10-CM

## 2017-09-11 DIAGNOSIS — M62830 Muscle spasm of back: Secondary | ICD-10-CM

## 2017-09-11 DIAGNOSIS — G8929 Other chronic pain: Secondary | ICD-10-CM
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
## 2017-09-12 ENCOUNTER — Ambulatory Visit: Payer: Commercial Managed Care - PPO | Admitting: Physical Therapy

## 2017-09-12 ENCOUNTER — Encounter: Payer: Self-pay | Admitting: Physical Therapy

## 2017-09-12 DIAGNOSIS — M545 Low back pain: Principal | ICD-10-CM

## 2017-09-12 DIAGNOSIS — M62830 Muscle spasm of back: Secondary | ICD-10-CM

## 2017-09-12 DIAGNOSIS — G8929 Other chronic pain: Secondary | ICD-10-CM

## 2017-09-12 NOTE — Therapy (Signed)
[facility] [place], [place], [address] Phone: [phone]   Fax:  [phone]  Physical Therapy Treatment  Patient Details  Name: [name] MRN: [id number] Date of Birth: 05-Feb-1971 Referring Provider: Letta Pate   Encounter Date: 09/12/2017  PT End of Session - 09/12/17 1144    Visit Number  13    Date for PT Re-Evaluation  09/16/17    PT Start Time  1100    PT Stop Time  1144    PT Time Calculation (min)  44 min    Activity Tolerance  Patient tolerated treatment well    Behavior During Therapy  [facility] for tasks assessed/performed       Past Medical History:  Diagnosis Date  . Anxiety   . Asthma   . Bell's palsy   . Bipolar disorder ([place])   . Cancer ([place])    skin  . Chronic headache disorder 03/08/2015  . Collapsed lung    right lung  . COPD (chronic obstructive pulmonary disease) ([place])   . Depression   . Dislocated shoulder   . Dyslipidemia   . Fatigue   . Fibromyalgia   . GERD (gastroesophageal reflux disease)   . Hypercholesteremia   . Migraine   . Nutcracker esophagus   . Obsessive-compulsive disorder   . OSA (obstructive sleep apnea)   . 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 Sleep apnea   . Thyroid disease     Past Surgical History:  Procedure Laterality Date  . ABDOMINAL HYSTERECTOMY    . ans     2001, 2010  . ANS unit     . CESAREAN SECTION    . CESAREAN SECTION     2007  . DILATION AND CURETTAGE OF UTERUS     2007  . DNC    . ESOPHAGEAL MANOMETRY N/A 04/29/2015   Procedure: ESOPHAGEAL MANOMETRY (EM);  Surgeon: Arta Silence, MD;  Location: [facility];  Service: Endoscopy;  Laterality: N/A;  . gallbladder removed    . GALLBLADDER SURGERY     1998  . SHOULDER SURGERY  1998   plate placed  . stretching of esophagus    . Tens unit     04/2014    There were no vitals filed for this visit.  Subjective Assessment - 09/12/17 1103    Subjective  "I am to tired  to tell"    Currently in Pain?  Yes    Pain Score  6  On medication    Pain Location  Back    Pain Orientation  Lower                      [facility] Adult PT Treatment/Exercise - 09/12/17 0001      Lumbar Exercises: Stretches   Passive Hamstring Stretch  4 reps;10 seconds    Single Knee to Chest Stretch  10 seconds;2 reps    Piriformis Stretch  10 seconds;4 reps      Lumbar Exercises: Aerobic   Stationary Bike  Nustep L 4 6 min      Lumbar Exercises: Machines for Strengthening   Cybex Lumbar Extension  black tband 2 sets 15    Cybex Knee Extension  10# 2 sets 15    Cybex Knee Flexion  35#  2 sets 15    Leg Press  40lb 3x10     Other Lumbar Machine Exercise   seated row 35# 3 sets 10  Lumbar Exercises: Standing   Other Standing Lumbar Exercises  Standing march with CL weight x10 each     Other Standing Lumbar Exercises  AR presses 25lb x10; cros body oblq x10 each      Knee/Hip Exercises: Seated   Sit to Sand  2 sets;10 reps;without UE support forward reaches with yellow ball               PT Short Term Goals - 07/26/17 0945      PT SHORT TERM GOAL #1   Title  give HEP    Baseline  focus on walking program and stretches    Status  Achieved        PT Long Term Goals - 09/11/17 1120      PT LONG TERM GOAL #2   Title  increase lumbar extension 25%    Status  Achieved            Plan - 09/12/17 1145    Clinical Impression Statement  Pt has progressed completing all functional goals. All exercises completed well despite constant low back pain.     Rehab Potential  Fair    PT Frequency  2x / week    PT Duration  8 weeks    PT Treatment/Interventions  ADLs/Self Care Home Management;Electrical Stimulation;Cryotherapy;Moist Heat;Traction;Therapeutic activities;Therapeutic exercise;Neuromuscular re-education;Patient/family education;Manual techniques;Orthotic Fit/Training    PT Next Visit Plan  Pt ~ to have reached a therapeutic plateau D/C         Patient will benefit from skilled therapeutic intervention in order to improve the following deficits and impairments:  Decreased mobility, Decreased balance, Decreased strength, Postural dysfunction, Improper body mechanics, Pain, Increased muscle spasms, Decreased range of motion  Visit Diagnosis: Chronic bilateral low back pain without sciatica  Muscle spasm of back     Problem List Patient Active Problem List   Diagnosis Date Noted  . Dyspnea 08/31/2016  . AKI (acute kidney injury) ([place]) 08/31/2016  . Chest pain 08/31/2016  . Chronic headache disorder 03/08/2015  . Chest pain syndrome 11/18/2014  . Dyslipidemia 11/18/2014  . Sleep apnea with use of continuous positive airway pressure (CPAP) 09/03/2013  . 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 Contusion of face, scalp, and neck except eye(s) 11/08/2012  . Mechanical complication of nervous system device, implant, and graft 11/08/2012  . Obstructive sleep apnea 11/08/2012  . Fibromyalgia 12/25/2011  . Depression, major 12/25/2011  . Bell's palsy 12/25/2011  . Chronic pain 12/25/2011  . GERD (gastroesophageal reflux disease) 12/25/2011   PHYSICAL THERAPY DISCHARGE SUMMARY  Visits from Start of Care: 13 Plan: Patient agrees to discharge.  Patient goals were partially met. Patient is being discharged due to lack of progress.  ?????     Scot Jun, PTA 09/12/2017, 11:48 AM  [place], [place], [address] Phone: [phone]   Fax:  [phone]  Name: ALLEXA ACOFF MRN: [id number] Date of Birth: 09/16/1971

## 2017-09-14 ENCOUNTER — Encounter (HOSPITAL_COMMUNITY): Payer: Self-pay | Admitting: Emergency Medicine

## 2017-09-14 ENCOUNTER — Other Ambulatory Visit: Payer: Self-pay

## 2017-09-14 DIAGNOSIS — Z79899 Other long term (current) drug therapy: Secondary | ICD-10-CM | POA: Diagnosis not present

## 2017-09-14 DIAGNOSIS — J449 Chronic obstructive pulmonary disease, unspecified: Secondary | ICD-10-CM | POA: Insufficient documentation

## 2017-09-14 DIAGNOSIS — Z87891 Personal history of nicotine dependence: Secondary | ICD-10-CM | POA: Diagnosis not present

## 2017-09-14 DIAGNOSIS — J029 Acute pharyngitis, unspecified: Secondary | ICD-10-CM | POA: Diagnosis not present

## 2017-09-14 DIAGNOSIS — E079 Disorder of thyroid, unspecified: Secondary | ICD-10-CM | POA: Diagnosis not present

## 2017-09-14 LAB — URINALYSIS, ROUTINE W REFLEX MICROSCOPIC
BILIRUBIN URINE: NEGATIVE
GLUCOSE, UA: NEGATIVE mg/dL
HGB URINE DIPSTICK: NEGATIVE
Ketones, ur: NEGATIVE mg/dL
Leukocytes, UA: NEGATIVE
Nitrite: NEGATIVE
PROTEIN: NEGATIVE mg/dL
Specific Gravity, Urine: 1.018 (ref 1.005–1.030)
pH: 5 (ref 5.0–8.0)

## 2017-09-14 LAB — COMPREHENSIVE METABOLIC PANEL
ALBUMIN: 4.4 g/dL (ref 3.5–5.0)
ALK PHOS: 87 U/L (ref 38–126)
ALT: 39 U/L (ref 14–54)
AST: 30 U/L (ref 15–41)
Anion gap: 10 (ref 5–15)
BUN: 7 mg/dL (ref 6–20)
CALCIUM: 9.6 mg/dL (ref 8.9–10.3)
CHLORIDE: 102 mmol/L (ref 101–111)
CO2: 25 mmol/L (ref 22–32)
CREATININE: 0.79 mg/dL (ref 0.44–1.00)
GFR calc non Af Amer: >60 mL/min (ref >60)
GLUCOSE: 125 mg/dL — AB (ref 65–99)
Potassium: 3.8 mmol/L (ref 3.5–5.1)
SODIUM: 137 mmol/L (ref 135–145)
Total Bilirubin: 0.7 mg/dL (ref 0.3–1.2)
Total Protein: 7.4 g/dL (ref 6.5–8.1)

## 2017-09-14 LAB — CBC
HCT: 40.2 % (ref 36.0–46.0)
Hemoglobin: 13.4 g/dL (ref 12.0–15.0)
MCH: 27.7 pg (ref 26.0–34.0)
MCHC: 33.3 g/dL (ref 30.0–36.0)
MCV: 83.1 fL (ref 78.0–100.0)
PLATELETS: 269 10*3/uL (ref 150–400)
RBC: 4.84 MIL/uL (ref 3.87–5.11)
RDW: 13.9 % (ref 11.5–15.5)
WBC: 8.8 10*3/uL (ref 4.0–10.5)

## 2017-09-14 LAB — LIPASE, BLOOD: LIPASE: 23 U/L (ref 11–51)

## 2017-09-14 NOTE — ED Triage Notes (Signed)
Patient arrives with complaint of swollen mouth and throat since this AM. States she was started on new medication last night: Flagyl, for persistent diarrhea for 3 weeks. Patient states she was okay last night before bed around 8 or 9. Initially this RN noted that patient has pronounced right facial droop. 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 Throat appears inflamed with some white streaking noted.

## 2017-09-15 ENCOUNTER — Emergency Department (HOSPITAL_COMMUNITY)
Admission: EM | Admit: 2017-09-15 | Discharge: 2017-09-15 | Disposition: A | Discharge status: Home / Self Care | Payer: Commercial Managed Care - PPO | Attending: Emergency Medicine | Admitting: Emergency Medicine

## 2017-09-15 DIAGNOSIS — J029 Acute pharyngitis, unspecified: Secondary | ICD-10-CM

## 2017-09-15 DIAGNOSIS — T50905A Adverse effect of unspecified drugs, medicaments and biological substances, initial encounter: Secondary | ICD-10-CM

## 2017-09-15 LAB — RAPID STREP SCREEN (MED CTR MEBANE ONLY): STREPTOCOCCUS, GROUP A SCREEN (DIRECT): NEGATIVE

## 2017-09-15 MED ORDER — PREDNISONE 10 MG PO TABS: 20.0000 mg | ORAL_TABLET | Freq: Two times a day (BID) | ORAL | 0 refills | Status: DC

## 2017-09-15 MED ORDER — SODIUM CHLORIDE 0.9 % IV BOLUS (SEPSIS)
1000.0000 mL | Freq: Once | INTRAVENOUS | Status: AC
  Administered 2017-09-15: 1000 mL via INTRAVENOUS

## 2017-09-15 MED ORDER — DEXAMETHASONE SODIUM PHOSPHATE 10 MG/ML IJ SOLN
10.0000 mg | Freq: Once | INTRAMUSCULAR | Status: AC
  Administered 2017-09-15: 10 mg via INTRAVENOUS
  Filled 2017-09-15: qty 1

## 2017-09-15 MED ORDER — CIPROFLOXACIN HCL 500 MG PO TABS: 500.0000 mg | ORAL_TABLET | Freq: Two times a day (BID) | ORAL | 0 refills | Status: DC

## 2017-09-15 MED ORDER — DIPHENHYDRAMINE HCL 50 MG/ML IJ SOLN
25.0000 mg | Freq: Once | INTRAMUSCULAR | Status: AC
Start: 2017-09-15 — End: 2017-09-15
  Administered 2017-09-15: 25 mg via INTRAVENOUS
  Filled 2017-09-15: qty 1

## 2017-09-15 NOTE — ED Provider Notes (Signed)
[facility] EMERGENCY DEPARTMENT Provider Note   CSN: [id number] Arrival date & time: 09/14/17  2058     History   Chief Complaint Chief Complaint  Patient presents with  . Medication Reaction    HPI [name] is a 46 y.o. female.  Patient is a 46 year old female with extensive past medical history which includes fibromyalgia, depression, bipolar, Bell's palsy, anxiety, and asthma.  She presents today for evaluation of throat swelling and pain.  She reports having persistent diarrhea for 3 weeks.  She was seen at urgent care yesterday and prescribed Flagyl.  She reports taking this medication today and is now having swelling and discomfort in her throat that makes it difficult for her to swallow.  She denies any fevers or chills.   The history is provided by the patient.    Past Medical History:  Diagnosis Date  . Anxiety   . Asthma   . Bell's palsy   . Bipolar disorder ([place])   . Cancer ([place])    skin  . Chronic headache disorder 03/08/2015  . Collapsed lung    right lung  . COPD (chronic obstructive pulmonary disease) ([place])   . Depression   . Dislocated shoulder   . Dyslipidemia   . Fatigue   . Fibromyalgia   . GERD (gastroesophageal reflux disease)   . Hypercholesteremia   . Migraine   . Nutcracker esophagus   . Obsessive-compulsive disorder   . OSA (obstructive sleep apnea)   . 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 Sleep apnea   . Thyroid disease     Patient Active Problem List   Diagnosis Date Noted  . Dyspnea 08/31/2016  . AKI (acute kidney injury) ([place]) 08/31/2016  . Chest pain 08/31/2016  . Chronic headache disorder 03/08/2015  . Chest pain syndrome 11/18/2014  . Dyslipidemia 11/18/2014  . Sleep apnea with use of continuous positive airway pressure (CPAP) 09/03/2013  . 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 Contusion of face, scalp, and neck except eye(s) 11/08/2012  . Mechanical complication of nervous system device,  implant, and graft 11/08/2012  . Obstructive sleep apnea 11/08/2012  . Fibromyalgia 12/25/2011  . Depression, major 12/25/2011  . Bell's palsy 12/25/2011  . Chronic pain 12/25/2011  . GERD (gastroesophageal reflux disease) 12/25/2011    Past Surgical History:  Procedure Laterality Date  . ABDOMINAL HYSTERECTOMY    . ans     2001, 2010  . ANS unit     . CESAREAN SECTION    . CESAREAN SECTION     2007  . DILATION AND CURETTAGE OF UTERUS     2007  . DNC    . ESOPHAGEAL MANOMETRY N/A 04/29/2015   Procedure: ESOPHAGEAL MANOMETRY (EM);  Surgeon: Arta Silence, MD;  Location: [facility];  Service: Endoscopy;  Laterality: N/A;  . gallbladder removed    . GALLBLADDER SURGERY     1998  . SHOULDER SURGERY  1998   plate placed  . stretching of esophagus    . Tens unit     04/2014    OB History    No data available       Home Medications    Prior to Admission medications   Medication Sig Start Date End Date Taking? Authorizing Provider  acyclovir (ZOVIRAX) 400 MG tablet TAKE 1 TABLET BY MOUTH DAILY 09/27/14   [provider]  albuterol (ACCUNEB) 0.63 MG/3ML nebulizer solution Take 1 ampule by nebulization every 6 (six) hours as needed  for wheezing.    [provider]  albuterol (PROVENTIL HFA;VENTOLIN HFA) 108 (90 BASE) MCG/ACT inhaler Inhale 1 puff into the lungs every 6 (six) hours as needed for wheezing or shortness of breath.    [provider]  Biotin 5000 MCG CAPS Take 10,000 mcg by mouth 2 (two) times daily.     [provider]  BREO ELLIPTA 200-25 MCG/INH AEPB INL 1 PUFF PO  D 09/11/16   [provider]  Brexpiprazole (REXULTI) 3 MG TABS Take 3 mg by mouth at bedtime.    [provider]  cetirizine (ZYRTEC) 10 MG tablet Take 10 mg by mouth daily.    [provider]  dexmethylphenidate (FOCALIN XR) 20 MG 24 hr capsule Take 20 mg by mouth daily.    [provider]  dexmethylphenidate (FOCALIN) 10 MG  tablet Take 10 mg by mouth 2 (two) times daily.    [provider]  diazepam (VALIUM) 10 MG tablet Take 10 mg by mouth at bedtime. 08/06/17   [provider]  fluticasone (FLONASE) 50 MCG/ACT nasal spray Place 2 sprays into the nose daily as needed for allergies.     [provider]  gabapentin (NEURONTIN) 600 MG tablet Take 600 mg by mouth 4 (four) times daily.    [provider]  lamoTRIgine (LAMICTAL) 200 MG tablet Take 200 mg by mouth at bedtime.     [provider]  levothyroxine (SYNTHROID, LEVOTHROID) 75 MCG tablet Take 75 mcg by mouth daily before breakfast.    [provider]  linaclotide (LINZESS) 72 MCG capsule Take 72 mcg by mouth daily before breakfast.    [provider]  meloxicam (MOBIC) 15 MG tablet Take 15 mg by mouth daily as needed for pain.     [provider]  metaxalone (SKELAXIN) 800 MG tablet Take 800 mg by mouth 3 (three) times daily as needed for muscle spasms.     [provider]  Milnacipran HCl (SAVELLA) 100 MG TABS tablet Take 200 tablets by mouth daily. 08/06/17   [provider]  Multiple Vitamin (MULTIVITAMIN WITH MINERALS) TABS tablet Take 1 tablet by mouth daily.    [provider]  nitrofurantoin (MACRODANTIN) 100 MG capsule Take 100 mg by mouth 4 (four) times daily.    [provider]  nitroGLYCERIN (NITROSTAT) 0.4 MG SL tablet Place 0.4 mg under the tongue every 5 (five) minutes as needed for chest pain.    [provider]  omeprazole (PRILOSEC) 40 MG capsule Take 40 mg by mouth. 08/21/17 08/21/18  [provider]  propranolol ER (INDERAL LA) 60 MG 24 hr capsule Take 60 mg by mouth daily.     [provider]  trospium (SANCTURA) 20 MG tablet Take 20 mg by mouth 2 (two) times daily.    [provider]  urea (CARMOL) 40 % CREA Apply 1 application topically at bedtime.    [provider]    Family History Family  History  Adopted: Yes  Problem Relation Age of Onset  . Depression Mother   . Dementia Mother   . Depression Maternal Grandmother   . Diabetes Father     Social History Social History   Tobacco Use  . Smoking status: Former Smoker    Last attempt to quit: 06/25/2000    Years since quitting: 17.2  . Smokeless tobacco: Never Used  Substance Use Topics  . Alcohol use: No  . Drug use: No     Allergies  Dilaudid [hydromorphone hcl]; Iodinated diagnostic agents; Ketorolac tromethamine; Nsaids; Salicylates; and Tape   Review of Systems Review of Systems  All other systems reviewed and are negative.    Physical Exam Updated Vital Signs BP 122/82 (BP Location: Right Arm)   Pulse 90   Temp 98.3 F (36.8 C) (Oral)   Resp 18   SpO2 97%   Physical Exam  Constitutional: She is oriented to person, place, and time. She appears well-developed and well-nourished. No distress.  HENT:  Head: Normocephalic and atraumatic.  There is erythema of the posterior oropharynx and what appear to be exudates.  Neck: Normal range of motion. Neck supple.  Cardiovascular: Normal rate and regular rhythm. Exam reveals no gallop and no friction rub.  No murmur heard. Pulmonary/Chest: Effort normal and breath sounds normal. No respiratory distress. She has no wheezes.  Abdominal: Soft. Bowel sounds are normal. She exhibits no distension. There is no tenderness.  Musculoskeletal: Normal range of motion.  Neurological: She is alert and oriented to person, place, and time.  Skin: Skin is warm and dry. She is not diaphoretic.  Nursing note and vitals reviewed.    ED Treatments / Results  Labs (all labs ordered are listed, but only abnormal results are displayed) Labs Reviewed  COMPREHENSIVE METABOLIC PANEL - Abnormal; Notable for the following components:      Result Value   Glucose, Bld 125 (*)    All other components within normal limits  URINALYSIS, ROUTINE W REFLEX MICROSCOPIC -  Abnormal; Notable for the following components:   APPearance HAZY (*)    All other components within normal limits  RAPID STREP SCREEN (NOT AT [facility])  LIPASE, BLOOD  CBC    EKG  EKG Interpretation None       Radiology No results found.  Procedures Procedures (including critical care time)  Medications Ordered in ED Medications  sodium chloride 0.9 % bolus 1,000 mL (not administered)  dexamethasone (DECADRON) injection 10 mg (not administered)  diphenhydrAMINE (BENADRYL) injection 25 mg (not administered)     Initial Impression / Assessment and Plan / ED Course  I have reviewed the triage vital signs and the nursing notes.  Pertinent labs & imaging results that were available during my care of the patient were reviewed by me and considered in my medical decision making (see chart for details).  Patient presents here with throat pain and swelling that started after taking Flagyl.  On exam she has an erythematous throat, but no stridor or evidence for airway compromise.  She was given steroids and antihistamines.  She also had a strep test performed which was negative.  The appearance of her throat looks more infectious than it does allergic.  My plan is to prescribe prednisone and change her antibiotic from Flagyl to Cipro.  She is to then follow-up with her primary doctor and return for any problems.  Final Clinical Impressions(s) / ED Diagnoses   Final diagnoses:  None    ED Discharge Orders    None       Veryl Speak, MD 09/15/17 [phone]

## 2017-09-15 NOTE — ED Notes (Signed)
Patient is alert and orientedx4.  Patient was explained discharge instructions and they understood them with no questions.  The patient's husband Kalana Yust is taking the patient home.

## 2017-09-15 NOTE — ED Notes (Signed)
Family at bedside. 

## 2017-09-15 NOTE — ED Notes (Signed)
Spoke w/ main lab about pt strep test, stated they had to walk it over to micro.

## 2017-09-15 NOTE — ED Notes (Signed)
ED Provider at bedside. 

## 2017-09-15 NOTE — Discharge Instructions (Addendum)
Cipro as prescribed.  Stop taking metronidazole.  Benadryl 25 mg every 6 hours for the next 3 days.  Return to the emergency department if symptoms significantly worsen or change.

## 2017-09-17 LAB — CULTURE, GROUP A STREP (THRC)

## 2017-09-26 ENCOUNTER — Ambulatory Visit (HOSPITAL_BASED_OUTPATIENT_CLINIC_OR_DEPARTMENT_OTHER): Payer: Commercial Managed Care - PPO | Admitting: Physical Medicine & Rehabilitation

## 2017-09-26 ENCOUNTER — Encounter: Payer: Commercial Managed Care - PPO | Attending: Physical Medicine & Rehabilitation

## 2017-09-26 ENCOUNTER — Ambulatory Visit: Payer: Self-pay | Admitting: Physical Medicine & Rehabilitation

## 2017-09-26 ENCOUNTER — Encounter: Payer: Self-pay | Admitting: Physical Medicine & Rehabilitation

## 2017-09-26 VITALS — BP 118/82 | HR 87 | Resp 14

## 2017-09-26 DIAGNOSIS — M5459 Other low back pain: Secondary | ICD-10-CM

## 2017-09-26 DIAGNOSIS — M545 Low back pain, unspecified: Secondary | ICD-10-CM

## 2017-09-26 DIAGNOSIS — G8929 Other chronic pain: Secondary | ICD-10-CM | POA: Insufficient documentation

## 2017-09-26 NOTE — Progress Notes (Signed)
Bilateral Lumbar L3, L4  medial branch blocks and L 5 dorsal ramus injection under fluoroscopic guidance  Indication: Lumbar pain which is not relieved by medication management or other conservative care and interfering with self-care and mobility.  Informed consent was obtained after describing risks and benefits of the procedure with the patient, this includes bleeding, infection, paralysis and medication side effects.  The patient wishes to proceed and has given written consent.  The patient was placed in prone position.  The lumbar area was marked and prepped with Betadine.  One mL of 1% lidocaine was injected into each of 6 areas into the skin and subcutaneous tissue.  Then a 22-gauge 3.5in spinal needle was inserted targeting the junction of the left S1 superior articular process and sacral ala junction. Needle was advanced under fluoroscopic guidance.  Bone contact was made.  Isovue 200 was injected x 0.5 mL demonstrating no intravascular uptake.  Then a solution  of 2% MPF lidocaine was injected x 0.5 mL.  Then the left L5 superior articular process in transverse process junction was targeted.  Bone contact was made.  Isovue 200 was injected x 0.5 mL demonstrating no intravascular uptake. Then a solution containing  2% MPF lidocaine was injected x 0.5 mL.  Then the left L4 superior articular process in transverse process junction was targeted.  Bone contact was made.  Isovue 200 was injected x 0.5 mL demonstrating no intravascular uptake.  Then a solution containing2% MPF lidocaine was injected x 0.5 mL.  This same procedure was performed on the right side using the same needle, technique and injectate.  Patient tolerated procedure well.  Post procedure instructions were given.  

## 2017-09-26 NOTE — Progress Notes (Signed)
  PROCEDURE RECORD [facility]   Name: [name] DOB:1970-10-16 MRN: [id number]  Date:09/26/2017  Physician: Alysia Penna, MD    Nurse/CMA: Deona Novitski, CMA  Allergies:  Allergies  Allergen Reactions  . Metronidazole Anaphylaxis  . Dilaudid [Hydromorphone Hcl] Itching  . Iodinated Diagnostic Agents Other (See Comments)    unknown  . Ketorolac Tromethamine Swelling  . Nsaids     Can't take for fibromyalgia   . Prednisone Other (See Comments)    "anxiety"  . Salicylates Other (See Comments)    unknown  . Tape Other (See Comments), Itching and Rash    Blisters - any type of tape and Band-Aids  Other reaction(s): Other (See Comments) Blisters - any type of tape and Band-Aids  Blisters - any type of tape and Band-Aids   Blisters - any type of tape and Band-Aids  Blisters - any type of tape and Band-Aids     Consent Signed: Yes.    Is patient diabetic? No.  CBG today?   Pregnant: No. LMP: No LMP recorded. Patient has had a hysterectomy. (age 71-55)  Anticoagulants: no Anti-inflammatory: no Antibiotics: no  Procedure: bilateral L3,4,5  Medial branch block  Position: Prone Start Time: 12:57am  End Time: 1:10pm  Fluoro Time: 54s  RN/CMA Justn Quale, CMA Kenric Ginger, CMA    Time 12:45pm 1:15pm    BP 118/82 109/74    Pulse 87 92    Respirations 14 14    O2 Sat 94 93    S/S 6 6    Pain Level 8/10 5/10     D/C home with father, patient A & O X 3, D/C instructions reviewed, and sits independently.

## 2017-09-26 NOTE — Patient Instructions (Signed)

## 2017-10-15 ENCOUNTER — Telehealth: Payer: Self-pay | Admitting: Physical Medicine & Rehabilitation

## 2017-10-15 NOTE — Telephone Encounter (Signed)
Faxed all med rec's and request for  Appeal peer to peer to [facility] appeal section [facility]

## 2017-10-24 ENCOUNTER — Encounter: Payer: Self-pay | Admitting: Physical Medicine & Rehabilitation

## 2017-10-24 ENCOUNTER — Encounter: Payer: Commercial Managed Care - PPO | Attending: Physical Medicine & Rehabilitation

## 2017-10-24 ENCOUNTER — Ambulatory Visit (HOSPITAL_BASED_OUTPATIENT_CLINIC_OR_DEPARTMENT_OTHER): Payer: Commercial Managed Care - PPO | Admitting: Physical Medicine & Rehabilitation

## 2017-10-24 ENCOUNTER — Other Ambulatory Visit: Payer: Self-pay

## 2017-10-24 VITALS — BP 100/69 | HR 85

## 2017-10-24 DIAGNOSIS — M545 Low back pain: Secondary | ICD-10-CM | POA: Insufficient documentation

## 2017-10-24 DIAGNOSIS — M5459 Other low back pain: Secondary | ICD-10-CM

## 2017-10-24 DIAGNOSIS — G8929 Other chronic pain: Secondary | ICD-10-CM | POA: Diagnosis present

## 2017-10-24 NOTE — Progress Notes (Signed)
Bilateral Lumbar L3, L4  medial branch blocks and L 5 dorsal ramus injection under fluoroscopic guidance  Indication: Lumbar pain which is not relieved by medication management or other conservative care and interfering with self-care and mobility.  Informed consent was obtained after describing risks and benefits of the procedure with the patient, this includes bleeding, infection, paralysis and medication side effects.  The patient wishes to proceed and has given written consent.  The patient was placed in prone position.  The lumbar area was marked and prepped with Betadine.  One mL of 1% lidocaine was injected into each of 6 areas into the skin and subcutaneous tissue.  Then a 22-gauge 3.5in spinal needle was inserted targeting the junction of the left S1 superior articular process and sacral ala junction. Needle was advanced under fluoroscopic guidance.  Bone contact was made.  Isovue 200 was injected x 0.5 mL demonstrating no intravascular uptake.  Then a solution  of 2% MPF lidocaine was injected x 0.5 mL.  Then the left L5 superior articular process in transverse process junction was targeted.  Bone contact was made.  Isovue 200 was injected x 0.5 mL demonstrating no intravascular uptake. Then a solution containing  2% MPF lidocaine was injected x 0.5 mL.  Then the left L4 superior articular process in transverse process junction was targeted.  Bone contact was made.  Isovue 200 was injected x 0.5 mL demonstrating no intravascular uptake.  Then a solution containing2% MPF lidocaine was injected x 0.5 mL.  This same procedure was performed on the right side using the same needle, technique and injectate.  Patient tolerated procedure well.  Post procedure instructions were given.  

## 2017-10-24 NOTE — Patient Instructions (Signed)

## 2017-10-24 NOTE — Progress Notes (Signed)
  PROCEDURE RECORD [facility]   Name: [name] DOB:1971/02/19 MRN: [id number]  Date:10/24/2017  Physician: Alysia Penna, MD    Nurse/CMA: [place] CERTIFIED medical assitant  Allergies:  Allergies  Allergen Reactions  . Metronidazole Anaphylaxis and Swelling    Tongue swelling/ confusion  . Dilaudid [Hydromorphone Hcl] Itching  . Iodinated Diagnostic Agents Other (See Comments)    unknown  . Ketorolac Tromethamine Swelling  . Nsaids     Can't take for fibromyalgia   . Salicylates Other (See Comments)    unknown  . Prednisone Other (See Comments) and Anxiety    "anxiety" Other reaction(s): Other (See Comments) "anxiety" Messes up with her mental medications Messes up with her mental medications  . Tape Other (See Comments), Itching and Rash    Blisters - any type of tape and Band-Aids  Other reaction(s): Other (See Comments) Blisters - any type of tape and Band-Aids  Blisters - any type of tape and Band-Aids   Blisters - any type of tape and Band-Aids  Blisters - any type of tape and Band-Aids     Consent Signed: Yes.    Is patient diabetic? No.  CBG today?   Pregnant: No. LMP: No LMP recorded. Patient has had a hysterectomy. (age 5-55)  Anticoagulants: no Anti-inflammatory: no Antibiotics: no  Procedure: bilateral L3-4-5 Medial Branch Blocks Position: Prone Start Time: 12:41pm         End Time: 12:59pm         Fluoro Time: 1:11s  RN/CMA Sports administrator CMA    Time 12:05pm 1:03pm    BP 100/69 104/72    Pulse 85 88    Respirations 14 14    O2 Sat 94 94    S/S 6 6    Pain Level 8/10 5/10     D/C home with father, patient A & O X 3, D/C instructions reviewed, and sits independently.

## 2017-11-01 ENCOUNTER — Telehealth: Payer: Self-pay | Admitting: Physical Medicine & Rehabilitation

## 2017-11-01 NOTE — Telephone Encounter (Signed)
Recd letter of appeal - called patient and advised she needs to come sign letter also left message for appeal department to call and advise "who " is the representative??

## 2017-11-04 ENCOUNTER — Telehealth: Payer: Self-pay | Admitting: Physical Medicine & Rehabilitation

## 2017-11-04 NOTE — Telephone Encounter (Signed)
I LEFT VOICEMAIL 02.15.19 AND ALSO 218.19 3:15 PM WITH APPEALS PHONE NUMBER [phone]. [name] SIGNED HER APPEAL AND IT APPEARS [place] IS TO BE THE DESIGNATED REPRESENTATIVE TO DISCUSS APPEAL BUT CANNOT GET HUMAN TO RESPOND TO THIS QUESTION - VOICEMAIL STATES RESPONSE WILL BE WITHIN 24 HOURS. FAXED AND RECD [name] 02.18.19 TO [place] --[phone] SAME QUESTION

## 2017-11-07 DIAGNOSIS — K200 Eosinophilic esophagitis: Secondary | ICD-10-CM | POA: Insufficient documentation

## 2017-11-15 ENCOUNTER — Ambulatory Visit (HOSPITAL_BASED_OUTPATIENT_CLINIC_OR_DEPARTMENT_OTHER): Payer: Commercial Managed Care - PPO | Admitting: Physical Medicine & Rehabilitation

## 2017-11-15 ENCOUNTER — Encounter: Payer: Commercial Managed Care - PPO | Attending: Physical Medicine & Rehabilitation

## 2017-11-15 ENCOUNTER — Telehealth: Payer: Self-pay | Admitting: Physical Medicine & Rehabilitation

## 2017-11-15 ENCOUNTER — Encounter: Payer: Self-pay | Admitting: Physical Medicine & Rehabilitation

## 2017-11-15 VITALS — BP 117/81 | HR 82 | Resp 14

## 2017-11-15 DIAGNOSIS — M545 Low back pain: Secondary | ICD-10-CM

## 2017-11-15 DIAGNOSIS — G8929 Other chronic pain: Secondary | ICD-10-CM | POA: Diagnosis present

## 2017-11-15 DIAGNOSIS — M5459 Other low back pain: Secondary | ICD-10-CM

## 2017-11-15 DIAGNOSIS — M792 Neuralgia and neuritis, unspecified: Secondary | ICD-10-CM

## 2017-11-15 DIAGNOSIS — G5692 Unspecified mononeuropathy of left upper limb: Secondary | ICD-10-CM

## 2017-11-15 NOTE — Patient Instructions (Addendum)
Stretch your hip as directed before walking  We can do nerve block to shoulder on left

## 2017-11-15 NOTE — Telephone Encounter (Signed)
Spoke to [name] again [phone] she states she forwarded the 3 possible times to medical director - they are not told when the md will call.  See notes from 02.21 regarding appeal also
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
## 2017-11-25 ENCOUNTER — Telehealth: Payer: Self-pay | Admitting: Physical Medicine & Rehabilitation

## 2017-11-25 NOTE — Telephone Encounter (Signed)
Per [place] prior auth denied during peer to peer will review & appeal to 3rd level for [employer]/ 11/25/17/LM

## 2017-12-03 ENCOUNTER — Ambulatory Visit (HOSPITAL_BASED_OUTPATIENT_CLINIC_OR_DEPARTMENT_OTHER): Payer: Commercial Managed Care - PPO | Admitting: Physical Medicine & Rehabilitation

## 2017-12-03 ENCOUNTER — Encounter: Payer: Self-pay | Admitting: Physical Medicine & Rehabilitation

## 2017-12-03 VITALS — BP 111/77 | HR 97 | Resp 14

## 2017-12-03 DIAGNOSIS — M545 Low back pain: Secondary | ICD-10-CM | POA: Diagnosis not present

## 2017-12-03 DIAGNOSIS — G8929 Other chronic pain: Secondary | ICD-10-CM | POA: Diagnosis not present

## 2017-12-03 DIAGNOSIS — M5459 Other low back pain: Secondary | ICD-10-CM

## 2017-12-03 NOTE — Progress Notes (Signed)
  PROCEDURE RECORD [facility]   Name: [name] DOB:1971-08-16 MRN: [id number]  Date:12/03/2017  Physician: Alysia Penna, MD    Nurse/CMA: Avanti Jetter, CMA  Allergies:  Allergies  Allergen Reactions  . Flagyl [Metronidazole] Anaphylaxis and Swelling    Tongue swelling/ confusion  . Dilaudid [Hydromorphone Hcl] Itching  . Iodinated Diagnostic Agents Other (See Comments)    unknown  . Ketorolac Tromethamine Swelling  . Nsaids     Can't take for fibromyalgia   . Salicylates Other (See Comments)    unknown  . Prednisone Other (See Comments) and Anxiety    "anxiety" Other reaction(s): Other (See Comments) "anxiety" Messes up with her mental medications Messes up with her mental medications  . Tape Other (See Comments), Itching and Rash    Blisters - any type of tape and Band-Aids  Other reaction(s): Other (See Comments) Blisters - any type of tape and Band-Aids  Blisters - any type of tape and Band-Aids   Blisters - any type of tape and Band-Aids  Blisters - any type of tape and Band-Aids     Consent Signed: Yes.    Is patient diabetic? No.  CBG today?   Pregnant: No. LMP: No LMP recorded. Patient has had a hysterectomy. (age 57-55)  Anticoagulants: no Anti-inflammatory: no Antibiotics: no  Procedure: right L3-5 radio frequency neurotomy  Position: Prone Start Time: 1:45      End Time: 2:05pm  Fluoro Time: 1:00  RN/CMA Shinichi Anguiano, CMA Cylinda Santoli, CMA    Time 1;30pm 2:14pm    BP 111/77 110/72    Pulse 97 96    Respirations 14 14    O2 Sat 95 95    S/S 6 6    Pain Level 9/10 9/10     D/C home with friend [name], patient A & O X 3, D/C instructions reviewed, and sits independently.

## 2017-12-03 NOTE — Patient Instructions (Signed)

## 2017-12-03 NOTE — Progress Notes (Signed)

## 2017-12-12 ENCOUNTER — Telehealth: Payer: Self-pay | Admitting: *Deleted

## 2017-12-12 NOTE — Telephone Encounter (Signed)
If it is primarily on the left side, this is most likely related to relief of pain on the right side the patient may need a lumbar medial branch radiofrequency on the left side, please asked patient to schedule if she has not already

## 2017-12-12 NOTE — Telephone Encounter (Signed)
[name] called and she is having severe spasms in her lumbar area particularly on the left this morning.  She has never had them like this.  Please advise.

## 2017-12-13 NOTE — Telephone Encounter (Signed)
Message left on VM per DPR. She has appt 01/02/18 for left side.  I noted that per pmp aware she has rx for diazepam which helps with spasms and may have skelaxin as well.

## 2018-01-02 ENCOUNTER — Ambulatory Visit (HOSPITAL_BASED_OUTPATIENT_CLINIC_OR_DEPARTMENT_OTHER): Payer: Commercial Managed Care - PPO | Admitting: Physical Medicine & Rehabilitation

## 2018-01-02 ENCOUNTER — Encounter: Payer: Commercial Managed Care - PPO | Attending: Physical Medicine & Rehabilitation

## 2018-01-02 ENCOUNTER — Encounter: Payer: Self-pay | Admitting: Physical Medicine & Rehabilitation

## 2018-01-02 VITALS — BP 125/80 | HR 93 | Resp 14 | Ht 63.0 in | Wt 203.0 lb

## 2018-01-02 DIAGNOSIS — M545 Low back pain: Secondary | ICD-10-CM | POA: Diagnosis not present

## 2018-01-02 DIAGNOSIS — G8929 Other chronic pain: Secondary | ICD-10-CM | POA: Insufficient documentation

## 2018-01-02 DIAGNOSIS — M5459 Other low back pain: Secondary | ICD-10-CM

## 2018-01-02 NOTE — Progress Notes (Signed)
Left L5 dorsal ramus., left L4 and left L3 medial branch radio frequency neurotomy under fluoroscopic guidance  Indication: Low back pain due to lumbar spondylosis which has been relieved on 2 occasions by greater than 50% by lumbar medial branch blocks at corresponding levels.  Informed consent was obtained after describing risks and benefits of the procedure with the patient, this includes bleeding, bruising, infection, paralysis and medication side effects. The patient wishes to proceed and has given written consent. The patient was placed in a prone position. The lumbar and sacral area was marked and prepped with Betadine. A 25-gauge 1-1/2 inch needle was inserted into the skin and subcutaneous tissue at 3 sites in one ML of 1% lidocaine was injected into each site. Then a 18-gauge 10 cm radio frequency needle with a 1 cm curved active tip was inserted targeting the left S1 SAP/sacral ala junction. Bone contact was made and confirmed with lateral imaging.  motor stimulation at 2 Hz confirm proper needle location followed by injection of one ML of 1% MPF lidocaine. Then the left L5 SAP/transverse process junction was targeted. Bone contact was made and confirmed with lateral imaging motor stimulation at 2 Hz confirm proper needle location followed by injection of one ML of the solution containing one ML of  1% MPF lidocaine. Then the left L4 SAP/transverse process junction was targeted. Bone contact was made and confirmed with lateral imaging. motor stimulation at 2 Hz confirm proper needle location followed by injection of one ML of the solution containing one ML of1% MPF lidocaine. Radio frequency lesion  at 80C for 90 seconds was performed. Needles were removed. Post procedure instructions and vital signs were performed. Patient tolerated procedure well. Followup appointment was given.  

## 2018-01-02 NOTE — Patient Instructions (Addendum)
You had a radio frequency procedure today This was done to alleviate joint pain in your lumbar area We injected lidocaine which is a local anesthetic.  You may experience soreness at the injection sites. You may also experienced some irritation of the nerves that were heated I'm recommending ice for 30 minutes every 2 hours as needed for the next 24-48 hours   

## 2018-01-02 NOTE — Progress Notes (Signed)
  PROCEDURE RECORD [facility]   Name: [name] DOB:08/19/1971 MRN: [id number]  Date:01/02/2018  Physician: Alysia Penna, MD    Nurse/CMA: Huntley Demedeiros, CMA  Allergies:  Allergies  Allergen Reactions  . Flagyl [Metronidazole] Anaphylaxis and Swelling    Tongue swelling/ confusion  . Dilaudid [Hydromorphone Hcl] Itching  . Iodinated Diagnostic Agents Other (See Comments)    unknown  . Ketorolac Tromethamine Swelling  . Nsaids     Can't take for fibromyalgia   . Salicylates Other (See Comments)    unknown  . Prednisone Other (See Comments) and Anxiety    "anxiety" Other reaction(s): Other (See Comments) "anxiety" Messes up with her mental medications Messes up with her mental medications  . Tape Other (See Comments), Itching and Rash    Blisters - any type of tape and Band-Aids  Other reaction(s): Other (See Comments) Blisters - any type of tape and Band-Aids  Blisters - any type of tape and Band-Aids   Blisters - any type of tape and Band-Aids  Blisters - any type of tape and Band-Aids     Consent Signed: Yes.    Is patient diabetic? No.  CBG today?   Pregnant: No. LMP: No LMP recorded. Patient has had a hysterectomy. (age 45-55)  Anticoagulants: no Anti-inflammatory: no Antibiotics: no  Procedure: Left radiofrequency neurotomy  Position: Prone Start Time: 10:05am  End Time: 10:22am  Fluoro Time: 1:01  RN/CMA Carmelo Reidel, CMA Zita Ozimek, CMA    Time 9:50am 10:30am    BP 125/80 127/85    Pulse 93 86    Respirations 14 14    O2 Sat 94 97    S/S 6 6    Pain Level 8/10 10/10     D/C home with father, patient A & O X 3, D/C instructions reviewed, and sits independently.

## 2018-01-27 ENCOUNTER — Telehealth: Payer: Self-pay

## 2018-01-27 NOTE — Telephone Encounter (Signed)
Sounds reasonable.

## 2018-01-27 NOTE — Telephone Encounter (Signed)
Pt called stating she is having hand surgery tomorrow and will be taking pain medication after surgery

## 2018-02-13 ENCOUNTER — Other Ambulatory Visit: Payer: Self-pay

## 2018-02-13 ENCOUNTER — Encounter: Payer: Self-pay | Admitting: Physical Medicine & Rehabilitation

## 2018-02-13 ENCOUNTER — Encounter: Payer: Commercial Managed Care - PPO | Attending: Physical Medicine & Rehabilitation

## 2018-02-13 ENCOUNTER — Ambulatory Visit (HOSPITAL_BASED_OUTPATIENT_CLINIC_OR_DEPARTMENT_OTHER): Payer: Commercial Managed Care - PPO | Admitting: Physical Medicine & Rehabilitation

## 2018-02-13 VITALS — BP 114/78 | HR 79 | Ht 62.0 in | Wt 214.4 lb

## 2018-02-13 DIAGNOSIS — M792 Neuralgia and neuritis, unspecified: Secondary | ICD-10-CM

## 2018-02-13 DIAGNOSIS — M5459 Other low back pain: Secondary | ICD-10-CM

## 2018-02-13 DIAGNOSIS — M545 Low back pain: Secondary | ICD-10-CM | POA: Insufficient documentation

## 2018-02-13 DIAGNOSIS — G8929 Other chronic pain: Secondary | ICD-10-CM | POA: Diagnosis present

## 2018-02-13 NOTE — Patient Instructions (Addendum)
Ask if [facility] has cooled RF system to do Bilateral L3,L4 medial branch and L5 Dorsal ramus radiofrequency neurotomy
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
## 2018-02-20 DIAGNOSIS — I10 Essential (primary) hypertension: Secondary | ICD-10-CM

## 2018-02-20 HISTORY — DX: Essential (primary) hypertension: I10

## 2018-03-18 ENCOUNTER — Encounter: Payer: Commercial Managed Care - PPO | Attending: Physical Medicine & Rehabilitation

## 2018-03-18 ENCOUNTER — Ambulatory Visit (HOSPITAL_BASED_OUTPATIENT_CLINIC_OR_DEPARTMENT_OTHER): Payer: Commercial Managed Care - PPO | Admitting: Physical Medicine & Rehabilitation

## 2018-03-18 ENCOUNTER — Encounter: Payer: Self-pay | Admitting: Physical Medicine & Rehabilitation

## 2018-03-18 VITALS — BP 117/79 | HR 90 | Resp 14 | Ht 62.0 in | Wt 204.0 lb

## 2018-03-18 DIAGNOSIS — M545 Low back pain: Secondary | ICD-10-CM | POA: Diagnosis not present

## 2018-03-18 DIAGNOSIS — G8929 Other chronic pain: Secondary | ICD-10-CM | POA: Diagnosis not present

## 2018-03-18 DIAGNOSIS — M792 Neuralgia and neuritis, unspecified: Secondary | ICD-10-CM | POA: Diagnosis not present

## 2018-03-18 NOTE — Progress Notes (Signed)
Indication chronic shoulder pain that is not responsive to medication management and other conservative care  Pain is severe and interferes with activities  Informed consent was obtained after describing risks and benefits of the procedure including bleeding bruising and infection. She elects to proceed and has given written consent. Patient placed in a seated position medial to lateral approach utilized. Linear transducer placed in oblique coronal plane. Suprascapular notch identified.1% lidocaine x 2.39ml infiltrated into skin and subcutaneous tissues  Needle inserted in plane medial to lateral. Once target was reached with needle tip, a solution containing one ML 6 ML per cc betamethasone and 4 mL 0.5% Marcaine were injected. Patient tolerated procedure well. Post procedure instructions given  Pt performed Left shoulder abduction to 120 deg without pain post procedure

## 2018-03-18 NOTE — Patient Instructions (Signed)
Instructions for  Suprascapular nerve block with Marcaine, celestone and Lidocaine Duration of response varies from hours to months. Will se you in 6 weeks  Chiropractor Dr. [facility] [address] [place], [place] [address] [phone]

## 2018-04-29 ENCOUNTER — Ambulatory Visit: Payer: Commercial Managed Care - PPO | Admitting: Physical Medicine & Rehabilitation

## 2018-05-08 ENCOUNTER — Encounter: Payer: Commercial Managed Care - PPO | Attending: Physical Medicine & Rehabilitation

## 2018-05-08 ENCOUNTER — Encounter: Payer: Self-pay | Admitting: Physical Medicine & Rehabilitation

## 2018-05-08 ENCOUNTER — Ambulatory Visit (HOSPITAL_BASED_OUTPATIENT_CLINIC_OR_DEPARTMENT_OTHER): Payer: Commercial Managed Care - PPO | Admitting: Physical Medicine & Rehabilitation

## 2018-05-08 VITALS — BP 126/80 | HR 90 | Resp 14 | Ht 62.0 in | Wt 212.0 lb

## 2018-05-08 DIAGNOSIS — M545 Low back pain: Secondary | ICD-10-CM | POA: Insufficient documentation

## 2018-05-08 DIAGNOSIS — M25561 Pain in right knee: Secondary | ICD-10-CM

## 2018-05-08 DIAGNOSIS — M25562 Pain in left knee: Secondary | ICD-10-CM

## 2018-05-08 DIAGNOSIS — M25579 Pain in unspecified ankle and joints of unspecified foot: Secondary | ICD-10-CM | POA: Diagnosis not present

## 2018-05-08 DIAGNOSIS — G8929 Other chronic pain: Secondary | ICD-10-CM | POA: Insufficient documentation
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
## 2018-05-12 ENCOUNTER — Telehealth: Payer: Self-pay | Admitting: *Deleted

## 2018-05-12 NOTE — Telephone Encounter (Signed)
ok 

## 2018-05-12 NOTE — Telephone Encounter (Signed)
[name] says that she talked to [facility] and they said with the wires in her back she can not see a chiropractor.

## 2018-05-15 ENCOUNTER — Telehealth (INDEPENDENT_AMBULATORY_CARE_PROVIDER_SITE_OTHER): Payer: Self-pay | Admitting: Orthopaedic Surgery

## 2018-05-15 ENCOUNTER — Ambulatory Visit (INDEPENDENT_AMBULATORY_CARE_PROVIDER_SITE_OTHER): Payer: Self-pay

## 2018-05-15 ENCOUNTER — Encounter (INDEPENDENT_AMBULATORY_CARE_PROVIDER_SITE_OTHER): Payer: Self-pay | Admitting: Orthopaedic Surgery

## 2018-05-15 ENCOUNTER — Ambulatory Visit (INDEPENDENT_AMBULATORY_CARE_PROVIDER_SITE_OTHER): Payer: Commercial Managed Care - PPO | Admitting: Orthopaedic Surgery

## 2018-05-15 ENCOUNTER — Other Ambulatory Visit (INDEPENDENT_AMBULATORY_CARE_PROVIDER_SITE_OTHER): Payer: Self-pay | Admitting: Radiology

## 2018-05-15 VITALS — BP 118/80 | HR 76 | Ht 62.0 in | Wt 212.0 lb

## 2018-05-15 DIAGNOSIS — M25562 Pain in left knee: Secondary | ICD-10-CM | POA: Diagnosis not present

## 2018-05-15 DIAGNOSIS — G8929 Other chronic pain: Secondary | ICD-10-CM | POA: Diagnosis not present

## 2018-05-15 DIAGNOSIS — M545 Low back pain: Principal | ICD-10-CM

## 2018-05-15 DIAGNOSIS — M5442 Lumbago with sciatica, left side: Secondary | ICD-10-CM | POA: Diagnosis not present

## 2018-05-15 DIAGNOSIS — M25561 Pain in right knee: Secondary | ICD-10-CM

## 2018-05-15 DIAGNOSIS — M5441 Lumbago with sciatica, right side: Secondary | ICD-10-CM
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
## 2018-05-15 NOTE — Telephone Encounter (Signed)
[name] from [facility] called stating they received the referral for the CT and they need it changed to either CT lumbar spine with contrast OR CT lumbar spine without contrast.

## 2018-05-16 ENCOUNTER — Other Ambulatory Visit (INDEPENDENT_AMBULATORY_CARE_PROVIDER_SITE_OTHER): Payer: Self-pay | Admitting: Radiology

## 2018-05-16 NOTE — Telephone Encounter (Signed)
Changed order.

## 2018-05-23 ENCOUNTER — Telehealth: Payer: Self-pay | Admitting: *Deleted

## 2018-05-23 MED ORDER — PREGABALIN 50 MG PO CAPS: 50.0000 mg | ORAL_CAPSULE | Freq: Two times a day (BID) | ORAL | 0 refills | Status: DC

## 2018-05-23 NOTE — Telephone Encounter (Signed)
Patient left a vague message stating she is in excrutiating pain and is asking for help. I contacted patient for clarity.  Fibromyalgia pain is all over her body.  No specific location.  Injection therapy is not helping.  She is reaching out wondering if there is anything Dr. Letta Pate can do.

## 2018-05-23 NOTE — Telephone Encounter (Signed)
Called Lyrica to pharmacy.  [name] says she is taking ext release gabapentin (horizant) so I will have to wait for an answer from Dr Letta Pate as to whether she can take both.

## 2018-05-23 NOTE — Telephone Encounter (Signed)
Dr Letta Pate said she could take it together so I notified Alica

## 2018-05-23 NOTE — Telephone Encounter (Signed)
We can call in some Lyrica 50mg  BID #60 no refill ,although  I think the pt had tried in the past

## 2018-05-26 ENCOUNTER — Telehealth (INDEPENDENT_AMBULATORY_CARE_PROVIDER_SITE_OTHER): Payer: Self-pay | Admitting: Orthopaedic Surgery

## 2018-05-26 ENCOUNTER — Inpatient Hospital Stay: Admission: RE | Admit: 2018-05-26 | Payer: Self-pay | Source: Ambulatory Visit

## 2018-05-26 NOTE — Telephone Encounter (Signed)
JUST AN FYI 

## 2018-05-26 NOTE — Telephone Encounter (Signed)
FYI: [facility] called to let you know patient was a no show for her MRI today.

## 2018-05-28 ENCOUNTER — Ambulatory Visit
Admission: RE | Admit: 2018-05-28 | Discharge: 2018-05-28 | Disposition: A | Discharge status: Home / Self Care | Payer: Commercial Managed Care - PPO | Source: Ambulatory Visit | Attending: Orthopaedic Surgery | Admitting: Orthopaedic Surgery

## 2018-05-28 DIAGNOSIS — G8929 Other chronic pain: Secondary | ICD-10-CM

## 2018-05-28 DIAGNOSIS — M545 Low back pain: Principal | ICD-10-CM

## 2018-06-05 ENCOUNTER — Ambulatory Visit (INDEPENDENT_AMBULATORY_CARE_PROVIDER_SITE_OTHER): Payer: Self-pay

## 2018-06-05 ENCOUNTER — Ambulatory Visit (INDEPENDENT_AMBULATORY_CARE_PROVIDER_SITE_OTHER): Payer: Commercial Managed Care - PPO | Admitting: Orthopedic Surgery

## 2018-06-05 ENCOUNTER — Encounter (INDEPENDENT_AMBULATORY_CARE_PROVIDER_SITE_OTHER): Payer: Self-pay | Admitting: Orthopedic Surgery

## 2018-06-05 VITALS — BP 148/93 | HR 80 | Ht 62.0 in | Wt 212.0 lb

## 2018-06-05 DIAGNOSIS — S9032XA Contusion of left foot, initial encounter: Secondary | ICD-10-CM | POA: Diagnosis not present

## 2018-06-05 DIAGNOSIS — T148XXA Other injury of unspecified body region, initial encounter: Secondary | ICD-10-CM

## 2018-06-05 MED ORDER — HYDROCODONE-ACETAMINOPHEN 5-325 MG PO TABS
1.0000 | ORAL_TABLET | Freq: Four times a day (QID) | ORAL | 0 refills | Status: DC | PRN
Start: 2018-06-05 — End: 2018-06-13

## 2018-06-05 NOTE — Progress Notes (Signed)
Office Visit Note   Patient: [name]           Date of Birth: 02/04/1971           MRN: [id number] Visit Date: 06/05/2018              Requested by: Berkley Harvey, NP [place], [place] [id number] PCP: Berkley Harvey, NP   Assessment & Plan: Visit Diagnoses:  1. Contusion of left foot, initial encounter   2. Bone bruise     Plan:  #1: We are going to send her to the brace shop for an equalizer boot #2: She has crutches at home and she can use one in her right arm or use both of the home to make her nonweightbearing. #3: Given her 20 hydrocodone for pain  Follow-Up Instructions: Return in about 2 weeks (around 06/19/2018).   Orders:  Orders Placed This Encounter  Procedures  . XR Foot Complete Left   Meds ordered this encounter  Medications  . HYDROcodone-acetaminophen (NORCO/VICODIN) 5-325 MG tablet    Sig: Take 1 tablet by mouth every 6 (six) hours as needed for moderate pain.    Dispense:  20 tablet    Refill:  0    Order Specific Question:   Supervising Provider    Answer:   Garald Balding [[id number]]      Procedures: No procedures performed   Clinical Data: No additional findings.   Subjective: Chief Complaint  Patient presents with  . Follow-up    05/27/18 HAD LAPTOP FALL ON LEFT FOOT STILL NO BETTER, SEEN AT URGENT CARE HAVING PAIN  AND SWELLING    HPI  [name] is seen today for evaluation of her foot.  Apparently on May 28, 2018 she had a laptop fall on the dorsum of her left foot.  She had pain and discomfort and swelling noted at that time.  She was seen on [facility] September at [facility] at the urgent care at [place].Was given a prescription for hydrocodone at that time.  She at that time expressed pain with weightbearing and had worsened from the time of her injury.  She had also noted some bruising and stiffness and swelling in the dorsum of her foot.  Difficulty with weightbearing.  They  noted at the time of their exam swelling at the dorsum of the foot over the metatarsal phalangeal joint.  There is decreased range of motion secondary to pain and swelling.  X-rays were obtained at that time and were essentially unremarkable.  She returned to urgent care on the 17th and was noted at that time continued pain in the left foot.  They showed that there was no ecchymosis or soft tissue swelling.  There was some subjective decreased range of motion of the toes.  Hyperesthesia on the palpation of the left foot was noted.  They did not reveal any neurovascular compromise at that time.  Repeat x-rays were performed showing no fracture dislocation or swelling in noted.  At that time she was given Mobic and then told to follow back up with an orthopedic.  She comes in today for evaluation with continued pain discomfort.  She has difficulty with ambulation.  Review of Systems  Constitutional: Positive for fatigue.  HENT: Negative for ear pain.   Eyes: Negative for pain.  Respiratory: Negative for cough and shortness of breath.   Cardiovascular: Positive for leg swelling.  Gastrointestinal: Positive for constipation.  Negative for diarrhea.  Genitourinary: Negative for difficulty urinating.  Musculoskeletal: Positive for back pain. Negative for neck pain.  Skin: Negative for rash.  Allergic/Immunologic: Negative for food allergies.  Neurological: Positive for weakness. Negative for numbness.  Hematological: Does not bruise/bleed easily.  Psychiatric/Behavioral: Positive for sleep disturbance.     Objective: Vital Signs: BP (!) 148/93 (BP Location: Left Arm, Patient Position: Sitting, Cuff Size: Normal)   Pulse 80   Physical Exam  Constitutional: She is oriented to person, place, and time. She appears well-developed and well-nourished.  HENT:  Mouth/Throat: Oropharynx is clear and moist.  Eyes: Pupils are equal, round, and reactive to light. EOM are normal.  Pulmonary/Chest: Effort  normal.  Neurological: She is alert and oriented to person, place, and time.  Skin: Skin is warm and dry.  Psychiatric: She has a normal mood and affect. Her behavior is normal.    Ortho Exam  Today she has may be a little bit of ecchymosis over the distal portion of the foot.  A little bit into the toes also second third and fourth.  She is diffusely tender about the dorsum of the foot.  No medial or lateral pain noted.  She is able to wiggle her toes.  Good motion of her ankle.  She is exquisitely tender to even light touching of the skin.  Specialty Comments:  No specialty comments available.  Imaging: Xr Foot Complete Left  Result Date: 06/05/2018 Three-view x-ray of the left foot today with marker is essentially unremarkable.  Do not see any fracture pattern.    PMFS History: Patient Active Problem List   Diagnosis Date Noted  . Chronic pain of both knees 05/08/2018  . Chronic ankle pain 05/08/2018  . Dyspnea 08/31/2016  . AKI (acute kidney injury) ([place]) 08/31/2016  . Chest pain 08/31/2016  . Chronic headache disorder 03/08/2015  . Chest pain syndrome 11/18/2014  . Dyslipidemia 11/18/2014  . Sleep apnea with use of continuous positive airway pressure (CPAP) 09/03/2013  . 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 Contusion of face, scalp, and neck except eye(s) 11/08/2012  . Mechanical complication of nervous system device, implant, and graft 11/08/2012  . Obstructive sleep apnea 11/08/2012  . Fibromyalgia 12/25/2011  . Depression, major 12/25/2011  . Bell's palsy 12/25/2011  . Chronic pain 12/25/2011  . GERD (gastroesophageal reflux disease) 12/25/2011   Past Medical History:  Diagnosis Date  . Anxiety   . Asthma   . Bell's palsy   . Bipolar disorder ([place])   . Cancer ([place])    skin  . Chronic headache disorder 03/08/2015  . Collapsed lung    right lung  . COPD (chronic obstructive pulmonary disease) ([place])   . Depression   . Dislocated shoulder   .  Dyslipidemia   . Fatigue   . Fibromyalgia   . GERD (gastroesophageal reflux disease)   . Hypercholesteremia   . Migraine   . Nutcracker esophagus   . Obsessive-compulsive disorder   . OSA (obstructive sleep apnea)   . 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 Sleep apnea   . Thyroid disease     Family History  Adopted: Yes  Problem Relation Age of Onset  . Depression Mother   . Dementia Mother   . Depression Maternal Grandmother   . Diabetes Father     Past Surgical History:  Procedure Laterality Date  . ABDOMINAL HYSTERECTOMY    . ans     2001, 2010  . ANS unit     .  CESAREAN SECTION    . CESAREAN SECTION     2007  . DILATION AND CURETTAGE OF UTERUS     2007  . DNC    . ESOPHAGEAL MANOMETRY N/A 04/29/2015   Procedure: ESOPHAGEAL MANOMETRY (EM);  Surgeon: Arta Silence, MD;  Location: [facility];  Service: Endoscopy;  Laterality: N/A;  . gallbladder removed    . GALLBLADDER SURGERY     1998  . SHOULDER SURGERY  1998   plate placed  . stretching of esophagus    . Tens unit     04/2014   Social History   Occupational History  . Occupation: disabled  Tobacco Use  . Smoking status: Former Smoker    Last attempt to quit: 06/25/2000    Years since quitting: 17.9  . Smokeless tobacco: Never Used  Substance and Sexual Activity  . Alcohol use: No  . Drug use: No  . Sexual activity: Not on file

## 2018-06-10 ENCOUNTER — Telehealth (INDEPENDENT_AMBULATORY_CARE_PROVIDER_SITE_OTHER): Payer: Self-pay | Admitting: Orthopaedic Surgery

## 2018-06-10 NOTE — Telephone Encounter (Signed)
Patient called stating she is having trouble using two crutches and wants to use one.  Patient is calling to make sure she can put the weight on her left foot and what side she should use the crutch.

## 2018-06-10 NOTE — Telephone Encounter (Signed)
Ok to use one crutch or none if pain tolerable

## 2018-06-10 NOTE — Telephone Encounter (Signed)
PLEASE ADVISE.

## 2018-06-11 NOTE — Telephone Encounter (Signed)
NOTIFIED PT TO USE 1 CRUTCH OR NO CRUTCH AS LONG AS IT WASN'T TO PAINFUL

## 2018-06-13 ENCOUNTER — Telehealth (INDEPENDENT_AMBULATORY_CARE_PROVIDER_SITE_OTHER): Payer: Self-pay | Admitting: Orthopaedic Surgery

## 2018-06-13 ENCOUNTER — Other Ambulatory Visit (INDEPENDENT_AMBULATORY_CARE_PROVIDER_SITE_OTHER): Payer: Self-pay | Admitting: *Deleted

## 2018-06-13 MED ORDER — HYDROCODONE-ACETAMINOPHEN 5-325 MG PO TABS: 1.0000 | ORAL_TABLET | Freq: Four times a day (QID) | ORAL | 0 refills | Status: DC | PRN

## 2018-06-13 NOTE — Telephone Encounter (Signed)
PLEASE ADVISE.

## 2018-06-13 NOTE — Telephone Encounter (Signed)
Patient called requesting prescription refill of Hydrocodone to be sent to [facility] on [facility].   Patient states she only has 3 pills remaining which will not get her through the weekend.  Patient states her left foot is still very painful.

## 2018-06-13 NOTE — Telephone Encounter (Signed)
OK 

## 2018-06-13 NOTE — Telephone Encounter (Signed)
I called patient to come pick up RX

## 2018-06-13 NOTE — Telephone Encounter (Signed)
PLEASE CALL IN MEDS

## 2018-06-13 NOTE — Telephone Encounter (Signed)
Patient called checking the status of her prescription of Hydrocodone.  Patient states she is just making sure the prescription can be called in today before the office closes.

## 2018-06-17 ENCOUNTER — Ambulatory Visit (INDEPENDENT_AMBULATORY_CARE_PROVIDER_SITE_OTHER): Payer: Commercial Managed Care - PPO | Admitting: Orthopaedic Surgery

## 2018-06-30 ENCOUNTER — Encounter (INDEPENDENT_AMBULATORY_CARE_PROVIDER_SITE_OTHER): Payer: Self-pay | Admitting: Orthopaedic Surgery

## 2018-06-30 ENCOUNTER — Ambulatory Visit (INDEPENDENT_AMBULATORY_CARE_PROVIDER_SITE_OTHER): Payer: Commercial Managed Care - PPO | Admitting: Orthopaedic Surgery

## 2018-06-30 VITALS — BP 124/74 | HR 82 | Resp 16 | Ht 62.0 in | Wt 218.0 lb

## 2018-06-30 DIAGNOSIS — M25572 Pain in left ankle and joints of left foot: Secondary | ICD-10-CM | POA: Diagnosis not present

## 2018-06-30 DIAGNOSIS — M5441 Lumbago with sciatica, right side: Secondary | ICD-10-CM | POA: Diagnosis not present

## 2018-06-30 DIAGNOSIS — M5442 Lumbago with sciatica, left side: Secondary | ICD-10-CM | POA: Diagnosis not present

## 2018-06-30 DIAGNOSIS — G8929 Other chronic pain: Secondary | ICD-10-CM | POA: Diagnosis not present

## 2018-06-30 NOTE — Progress Notes (Signed)
Office Visit Note   Patient: [name]           Date of Birth: Dec 01, 1970           MRN: [id number] Visit Date: 06/30/2018              Requested by: Berkley Harvey, NP [place], [place] [address] PCP: Berkley Harvey, NP   Assessment & Plan: Visit Diagnoses:  1. Chronic bilateral low back pain with bilateral sciatica   2. Chronic pain of left ankle     Plan: Persistent pain dorsum of left forefoot from a crush injury noted over a month ago.  No obvious changes clinically.  Has very active fibromyalgia.  Just recently increased her dose of gabapentin.  Continue with comfortable shoes.  I think this will resolve over time.  X-rays x2 since the injury were negative for fracture. Chronic low back pain continues to be an issue.  I ordered a CT scan of the lumbar spine she has an implantable device.  At L4-5 there is a broad-based disc bulge with moderate bilateral facet arthropathy and bilateral lateral recess narrowing as well as occasional pain referred to either lower extremity.  I would like Dr. Ernestina Patches to evaluate her to consider bilateral facet injections.  Total office time over 30 minutes regarding diagnosis treatment options for both of the above.  Discussed discontinuing hydrocodone and she agrees  Follow-Up Instructions: Return in about 1 month (around 07/31/2018).   Orders:  Orders Placed This Encounter  Procedures  . Ambulatory referral to Physical Medicine Rehab   No orders of the defined types were placed in this encounter.     Procedures: No procedures performed   Clinical Data: No additional findings.   Subjective: Chief Complaint  Patient presents with  . Left Foot - Follow-up  . Lower Back - Follow-up  . Foot Pain    Left pain on top of foot x weeks, difficullty walking  . Back Pain    CT results  Approximately 5 weeks status post crush injury to her left foot.  She dropped a laptop computer on the dorsum of her foot.   X-rays twice since the accident were negative for fracture.  This appears to be soft tissue problem.  She does relate to having very active fibromyalgia is recently increased her gabapentin dose  Also having chronic back pain.  MRI scan from September 11 is reviewed with her  HPI  Review of Systems  Constitutional: Positive for fatigue.  HENT: Positive for trouble swallowing.   Eyes: Positive for pain.  Respiratory: Positive for shortness of breath.   Cardiovascular: Positive for leg swelling.  Gastrointestinal: Positive for constipation.  Endocrine: Negative for cold intolerance.  Genitourinary: Negative for difficulty urinating.  Musculoskeletal: Positive for back pain and joint swelling.  Skin: Negative for rash.  Allergic/Immunologic: Negative for food allergies.  Neurological: Positive for weakness and numbness.  Hematological: Does not bruise/bleed easily.  Psychiatric/Behavioral: Negative for sleep disturbance.     Objective: Vital Signs: BP 124/74 (BP Location: Right Arm, Patient Position: Sitting, Cuff Size: Normal)   Pulse 82   Resp 16   Ht 5\' 2"  (1.575 m)   Wt 218 lb (98.9 kg)   BMI 39.87 kg/m   Physical Exam  Constitutional: She is oriented to person, place, and time. She appears well-developed and well-nourished.  HENT:  Mouth/Throat: Oropharynx is clear and moist.  Eyes: Pupils are equal, round, and  reactive to light. EOM are normal.  Pulmonary/Chest: Effort normal.  Neurological: She is alert and oriented to person, place, and time.  Skin: Skin is warm and dry.  Psychiatric: She has a normal mood and affect. Her behavior is normal.    Ortho Exam awake alert and oriented x3.  Comfortable sitting.  Examination left foot reveals an area of tenderness about the size of a quarter the dorsal aspect of her forefoot near the area of the second and third metatarsal phalangeal joints.  No skin changes.  No erythema or ecchymosis.  Normal sensibility to her toes.  No  deformity.  Straight leg raise negative bilaterally.  Some percussible tenderness of the lumbar spine in multiple locations.  Painless range of motion both hips.  Motor exam intact  Specialty Comments:  No specialty comments available.  Imaging: No results found.   PMFS History: Patient Active Problem List   Diagnosis Date Noted  . Chronic bilateral low back pain with bilateral sciatica 06/30/2018  . Chronic pain of both knees 05/08/2018  . Chronic ankle pain 05/08/2018  . Dyspnea 08/31/2016  . AKI (acute kidney injury) ([place]) 08/31/2016  . Chest pain 08/31/2016  . Chronic headache disorder 03/08/2015  . Chest pain syndrome 11/18/2014  . Dyslipidemia 11/18/2014  . Sleep apnea with use of continuous positive airway pressure (CPAP) 09/03/2013  . 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 Contusion of face, scalp, and neck except eye(s) 11/08/2012  . Mechanical complication of nervous system device, implant, and graft 11/08/2012  . Obstructive sleep apnea 11/08/2012  . Fibromyalgia 12/25/2011  . Depression, major 12/25/2011  . Bell's palsy 12/25/2011  . Chronic pain 12/25/2011  . GERD (gastroesophageal reflux disease) 12/25/2011   Past Medical History:  Diagnosis Date  . Anxiety   . Asthma   . Bell's palsy   . Bipolar disorder ([place])   . Cancer ([place])    skin  . Chronic headache disorder 03/08/2015  . Collapsed lung    right lung  . COPD (chronic obstructive pulmonary disease) ([place])   . Depression   . Dislocated shoulder   . Dyslipidemia   . Fatigue   . Fibromyalgia   . GERD (gastroesophageal reflux disease)   . Hypercholesteremia   . Migraine   . Nutcracker esophagus   . Obsessive-compulsive disorder   . OSA (obstructive sleep apnea)   . 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 Sleep apnea   . Thyroid disease     Family History  Adopted: Yes  Problem Relation Age of Onset  . Depression Mother   . Dementia Mother   . Depression Maternal Grandmother   .  Diabetes Father     Past Surgical History:  Procedure Laterality Date  . ABDOMINAL HYSTERECTOMY    . ans     2001, 2010  . ANS unit     . CESAREAN SECTION    . CESAREAN SECTION     2007  . DILATION AND CURETTAGE OF UTERUS     2007  . DNC    . ESOPHAGEAL MANOMETRY N/A 04/29/2015   Procedure: ESOPHAGEAL MANOMETRY (EM);  Surgeon: Arta Silence, MD;  Location: [facility];  Service: Endoscopy;  Laterality: N/A;  . gallbladder removed    . GALLBLADDER SURGERY     1998  . SHOULDER SURGERY  1998   plate placed  . stretching of esophagus    . Tens unit     04/2014   Social History   Occupational History  .  Occupation: disabled  Tobacco Use  . Smoking status: Former Smoker    Packs/day: 2.00    Years: 10.00    Pack years: 20.00    Types: Cigarettes    Start date: 1991    Last attempt to quit: 06/25/2000    Years since quitting: 18.0  . Smokeless tobacco: Never Used  Substance and Sexual Activity  . Alcohol use: No  . Drug use: No  . Sexual activity: Not on file

## 2018-07-04 ENCOUNTER — Other Ambulatory Visit: Payer: Self-pay | Admitting: Physical Medicine & Rehabilitation

## 2018-07-17 ENCOUNTER — Encounter (INDEPENDENT_AMBULATORY_CARE_PROVIDER_SITE_OTHER): Payer: Commercial Managed Care - PPO | Admitting: Physical Medicine and Rehabilitation

## 2018-07-18 ENCOUNTER — Other Ambulatory Visit: Payer: Self-pay

## 2018-07-18 ENCOUNTER — Emergency Department (HOSPITAL_COMMUNITY)
Admission: EM | Admit: 2018-07-18 | Discharge: 2018-07-18 | Disposition: A | Discharge status: Home / Self Care | Payer: Commercial Managed Care - PPO | Attending: Emergency Medicine | Admitting: Emergency Medicine

## 2018-07-18 ENCOUNTER — Encounter (HOSPITAL_COMMUNITY): Payer: Self-pay | Admitting: Emergency Medicine

## 2018-07-18 ENCOUNTER — Emergency Department (HOSPITAL_COMMUNITY): Payer: Commercial Managed Care - PPO

## 2018-07-18 DIAGNOSIS — Z85828 Personal history of other malignant neoplasm of skin: Secondary | ICD-10-CM | POA: Insufficient documentation

## 2018-07-18 DIAGNOSIS — Z87891 Personal history of nicotine dependence: Secondary | ICD-10-CM | POA: Insufficient documentation

## 2018-07-18 DIAGNOSIS — Z79899 Other long term (current) drug therapy: Secondary | ICD-10-CM | POA: Diagnosis not present

## 2018-07-18 DIAGNOSIS — J45909 Unspecified asthma, uncomplicated: Secondary | ICD-10-CM | POA: Insufficient documentation

## 2018-07-18 DIAGNOSIS — R091 Pleurisy: Secondary | ICD-10-CM | POA: Diagnosis not present

## 2018-07-18 LAB — CBC WITH DIFFERENTIAL/PLATELET
Abs Immature Granulocytes: 0.01 10*3/uL (ref 0.00–0.07)
BASOS PCT: 1 %
Basophils Absolute: 0 10*3/uL (ref 0.0–0.1)
EOS ABS: 0.2 10*3/uL (ref 0.0–0.5)
Eosinophils Relative: 3 %
HCT: 40.2 % (ref 36.0–46.0)
Hemoglobin: 13 g/dL (ref 12.0–15.0)
IMMATURE GRANULOCYTES: 0 %
LYMPHS PCT: 25 %
Lymphs Abs: 1.6 10*3/uL (ref 0.7–4.0)
MCH: 27.5 pg (ref 26.0–34.0)
MCHC: 32.3 g/dL (ref 30.0–36.0)
MCV: 85.2 fL (ref 80.0–100.0)
Monocytes Absolute: 0.4 10*3/uL (ref 0.1–1.0)
Monocytes Relative: 7 %
NEUTROS ABS: 4 10*3/uL (ref 1.7–7.7)
NEUTROS PCT: 64 %
PLATELETS: 258 10*3/uL (ref 150–400)
RBC: 4.72 MIL/uL (ref 3.87–5.11)
RDW: 13.1 % (ref 11.5–15.5)
WBC: 6.3 10*3/uL (ref 4.0–10.5)
nRBC: 0 % (ref 0.0–0.2)

## 2018-07-18 LAB — COMPREHENSIVE METABOLIC PANEL
ALT: 12 U/L (ref 0–44)
ANION GAP: 6 (ref 5–15)
AST: 18 U/L (ref 15–41)
Albumin: 4 g/dL (ref 3.5–5.0)
Alkaline Phosphatase: 63 U/L (ref 38–126)
BUN: 6 mg/dL (ref 6–20)
CHLORIDE: 105 mmol/L (ref 98–111)
CO2: 26 mmol/L (ref 22–32)
Calcium: 9.4 mg/dL (ref 8.9–10.3)
Creatinine, Ser: 0.78 mg/dL (ref 0.44–1.00)
GFR calc Af Amer: >60 mL/min (ref >60)
Glucose, Bld: 99 mg/dL (ref 70–99)
POTASSIUM: 4 mmol/L (ref 3.5–5.1)
Sodium: 137 mmol/L (ref 135–145)
TOTAL PROTEIN: 7 g/dL (ref 6.5–8.1)
Total Bilirubin: 0.7 mg/dL (ref 0.3–1.2)

## 2018-07-18 MED ORDER — HYDROCODONE-ACETAMINOPHEN 5-325 MG PO TABS: 1.0000 | ORAL_TABLET | Freq: Four times a day (QID) | ORAL | 0 refills | Status: DC | PRN
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
## 2018-07-18 NOTE — ED Triage Notes (Signed)
Pt to ER - reports recent diagnosis of pneumonia 3 days ago and states pain with deep breathing and just feeling very fatigued.

## 2018-07-31 ENCOUNTER — Encounter (INDEPENDENT_AMBULATORY_CARE_PROVIDER_SITE_OTHER): Payer: Self-pay | Admitting: Physical Medicine and Rehabilitation

## 2018-08-04 ENCOUNTER — Telehealth (INDEPENDENT_AMBULATORY_CARE_PROVIDER_SITE_OTHER): Payer: Self-pay | Admitting: Orthopaedic Surgery

## 2018-08-04 NOTE — Telephone Encounter (Signed)
Please advise 

## 2018-08-04 NOTE — Telephone Encounter (Signed)
LEFT MESSAGE TO SEE IF PT HAS SEEN SPINE SPECIALIST IN PAST OR WOULD LIKE TO GET REFERRED TO BACK DR. Truddie Stokes ON PT TO CALL BACK

## 2018-08-04 NOTE — Telephone Encounter (Signed)
Please call pt and ask if she has seen a spine specialist in the past. I would like to send her to one. If not ,ave either Dr Lorin Mercy or Dr Louanne Skye evaluate

## 2018-08-04 NOTE — Telephone Encounter (Signed)
Patient calling to let you know she had back injections before with no relief. Per patient Dr. Ernestina Patches felt like there was no reason to do more injections because she did not get relief in the past. Please call patient to advise next step at this point.

## 2018-08-04 NOTE — Telephone Encounter (Signed)
Please call patient. Thank you.  

## 2018-08-07 ENCOUNTER — Encounter (INDEPENDENT_AMBULATORY_CARE_PROVIDER_SITE_OTHER): Payer: Self-pay | Admitting: Physical Medicine and Rehabilitation

## 2018-08-08 ENCOUNTER — Ambulatory Visit: Payer: Commercial Managed Care - PPO | Admitting: Physical Medicine & Rehabilitation

## 2018-08-11 ENCOUNTER — Encounter: Payer: Self-pay | Admitting: Physical Medicine & Rehabilitation

## 2018-08-11 ENCOUNTER — Other Ambulatory Visit: Payer: Self-pay

## 2018-08-11 ENCOUNTER — Encounter: Payer: Commercial Managed Care - PPO | Attending: Physical Medicine & Rehabilitation

## 2018-08-11 ENCOUNTER — Ambulatory Visit (HOSPITAL_BASED_OUTPATIENT_CLINIC_OR_DEPARTMENT_OTHER): Payer: Commercial Managed Care - PPO | Admitting: Physical Medicine & Rehabilitation

## 2018-08-11 VITALS — BP 110/74 | HR 92 | Ht 62.0 in | Wt 217.2 lb

## 2018-08-11 DIAGNOSIS — M5441 Lumbago with sciatica, right side: Secondary | ICD-10-CM | POA: Diagnosis present

## 2018-08-11 DIAGNOSIS — M797 Fibromyalgia: Secondary | ICD-10-CM | POA: Diagnosis not present

## 2018-08-11 DIAGNOSIS — G4733 Obstructive sleep apnea (adult) (pediatric): Secondary | ICD-10-CM | POA: Insufficient documentation

## 2018-08-11 DIAGNOSIS — M5442 Lumbago with sciatica, left side: Secondary | ICD-10-CM | POA: Diagnosis present

## 2018-08-11 DIAGNOSIS — J449 Chronic obstructive pulmonary disease, unspecified: Secondary | ICD-10-CM | POA: Diagnosis not present

## 2018-08-11 DIAGNOSIS — F319 Bipolar disorder, unspecified: Secondary | ICD-10-CM | POA: Insufficient documentation

## 2018-08-11 DIAGNOSIS — M545 Low back pain: Secondary | ICD-10-CM

## 2018-08-11 DIAGNOSIS — E785 Hyperlipidemia, unspecified: Secondary | ICD-10-CM | POA: Insufficient documentation

## 2018-08-11 DIAGNOSIS — F17210 Nicotine dependence, cigarettes, uncomplicated: Secondary | ICD-10-CM | POA: Insufficient documentation

## 2018-08-11 DIAGNOSIS — M47816 Spondylosis without myelopathy or radiculopathy, lumbar region: Secondary | ICD-10-CM | POA: Diagnosis not present

## 2018-08-11 DIAGNOSIS — M25562 Pain in left knee: Secondary | ICD-10-CM

## 2018-08-11 DIAGNOSIS — G8929 Other chronic pain: Secondary | ICD-10-CM | POA: Insufficient documentation

## 2018-08-11 DIAGNOSIS — M25561 Pain in right knee: Secondary | ICD-10-CM | POA: Diagnosis not present

## 2018-08-11 DIAGNOSIS — M5459 Other low back pain: Secondary | ICD-10-CM
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
## 2018-08-11 NOTE — Patient Instructions (Signed)
Start bicycling 73min 3 times a week , work up to 6min 3 times a week, do not increase more than 5 min per week  Non medication pain relief     Try various muscle cremes  Aspercreme or others that contain trolamine- this is anti inflammatory  Capsaicin creme which reduces Pain substance P  Lidocaine containing creme which has a numbing medicine  Methol and camphor containing creme which has a cooling effect

## 2018-10-07 DIAGNOSIS — R2681 Unsteadiness on feet: Secondary | ICD-10-CM | POA: Insufficient documentation

## 2018-10-27 ENCOUNTER — Telehealth: Payer: Self-pay | Admitting: *Deleted

## 2018-10-27 NOTE — Telephone Encounter (Signed)
Blue called and she is having extreme low back pain with spasms down to her toes.  Please advise.

## 2018-10-28 NOTE — Telephone Encounter (Signed)
Pt can alternate heat and ice every 78min, if not effective may call in muscle relaxer such as flexeril 5mg  TID #45

## 2018-10-29 NOTE — Telephone Encounter (Signed)
Pt states she has already alternated heat and ice. Called in Flexeril 5mg  one TID #45 to  [facility] [address]

## 2018-10-30 ENCOUNTER — Other Ambulatory Visit: Payer: Self-pay | Admitting: Physical Medicine & Rehabilitation

## 2018-11-11 ENCOUNTER — Encounter: Payer: Self-pay | Admitting: Physical Medicine & Rehabilitation

## 2018-11-11 ENCOUNTER — Ambulatory Visit (HOSPITAL_BASED_OUTPATIENT_CLINIC_OR_DEPARTMENT_OTHER): Payer: Commercial Managed Care - PPO | Admitting: Physical Medicine & Rehabilitation

## 2018-11-11 ENCOUNTER — Encounter: Payer: Commercial Managed Care - PPO | Attending: Physical Medicine & Rehabilitation

## 2018-11-11 VITALS — BP 106/70 | HR 82 | Resp 16 | Ht 62.0 in | Wt 219.0 lb

## 2018-11-11 DIAGNOSIS — G510 Bell's palsy: Secondary | ICD-10-CM | POA: Diagnosis not present

## 2018-11-11 DIAGNOSIS — M7918 Myalgia, other site: Secondary | ICD-10-CM

## 2018-11-11 DIAGNOSIS — M797 Fibromyalgia: Secondary | ICD-10-CM

## 2018-11-11 DIAGNOSIS — M545 Low back pain, unspecified: Secondary | ICD-10-CM

## 2018-11-11 DIAGNOSIS — G8929 Other chronic pain: Secondary | ICD-10-CM

## 2018-11-11 MED ORDER — PREGABALIN 75 MG PO CAPS: 75.0000 mg | ORAL_CAPSULE | Freq: Two times a day (BID) | ORAL | 2 refills | Status: DC

## 2018-11-11 NOTE — Progress Notes (Signed)
Subjective:    Patient ID: [name], female    DOB: 10-06-1970, 48 y.o.   MRN: [id number] The patient has undergone lumbar radiofrequency neurotomy L3-L4 medial branch and L5 dorsal ramus after successful medial branch blocks at those levels.  Unfortunately she only had several weeks of relief with both the right side and left-sided procedure. She has seen orthopedic surgery who concurred with lumbar spondylosis as main diagnosis after CT of the lumbar spine was performed, patient was referred to physical medicine rehab at [place] who concurred with the injections that have been performed and no additional injections were recommended. HPI  Back flared up Seen by [facility]  PT for balance, checking to see if it is "inner ear" or brain.  Broken toe ~1 wk ago was seen in Urgent care Pain Inventory Average Pain 8 Pain Right Now 9 My pain is constant, sharp, stabbing and aching  In the last 24 hours, has pain interfered with the following? General activity 4 Relation with others 5 Enjoyment of life 8 What TIME of day is your pain at its worst? all Sleep (in general) Poor  Pain is worse with: walking, bending, sitting, inactivity and standing Pain improves with: nothing Relief from Meds: 0  Mobility walk without assistance ability to climb steps?  yes do you drive?  yes transfers alone  Function disabled: date disabled .  Neuro/Psych No problems in this area  Prior Studies Any changes since last visit?  no  Physicians involved in your care Any changes since last visit?  no   Family History  Adopted: Yes  Problem Relation Age of Onset  . Depression Mother   . Dementia Mother   . Depression Maternal Grandmother   . Diabetes Father    Social History   Socioeconomic History  . Marital status: Married    Spouse name: Not on file  . Number of children: 1  . Years of education: college  . Highest education level: Not on file  Occupational History  .  Occupation: disabled  Social Needs  . Financial resource strain: Not on file  . Food insecurity:    Worry: Not on file    Inability: Not on file  . Transportation needs:    Medical: Not on file    Non-medical: Not on file  Tobacco Use  . Smoking status: Former Smoker    Packs/day: 2.00    Years: 10.00    Pack years: 20.00    Types: Cigarettes    Start date: 1991    Last attempt to quit: 06/25/2000    Years since quitting: 18.3  . Smokeless tobacco: Never Used  Substance and Sexual Activity  . Alcohol use: No  . Drug use: No  . Sexual activity: Not on file  Lifestyle  . Physical activity:    Days per week: Not on file    Minutes per session: Not on file  . Stress: Not on file  Relationships  . Social connections:    Talks on phone: Not on file    Gets together: Not on file    Attends religious service: Not on file    Active member of club or organization: Not on file    Attends meetings of clubs or organizations: Not on file    Relationship status: Not on file  Other Topics Concern  . Not on file  Social History Narrative   Caffeine - patient is trying to stop drinking caffeine (she drinks 1 "huge"  cup of caffeine daily)   Patient is right handed.   Past Surgical History:  Procedure Laterality Date  . ABDOMINAL HYSTERECTOMY    . ans     2001, 2010  . ANS unit     . CESAREAN SECTION    . CESAREAN SECTION     2007  . DILATION AND CURETTAGE OF UTERUS     2007  . DNC    . ESOPHAGEAL MANOMETRY N/A 04/29/2015   Procedure: ESOPHAGEAL MANOMETRY (EM);  Surgeon: Arta Silence, MD;  Location: [facility];  Service: Endoscopy;  Laterality: N/A;  . gallbladder removed    . GALLBLADDER SURGERY     1998  . SHOULDER SURGERY  1998   plate placed  . stretching of esophagus    . Tens unit     04/2014   Past Medical History:  Diagnosis Date  . Anxiety   . Asthma   . Bell's palsy   . Bipolar disorder ([place])   . Cancer ([place])    skin  . Chronic headache disorder  03/08/2015  . Collapsed lung    right lung  . COPD (chronic obstructive pulmonary disease) ([place])   . Depression   . Dislocated shoulder   . Dyslipidemia   . Fatigue   . Fibromyalgia   . GERD (gastroesophageal reflux disease)   . Hypercholesteremia   . Migraine   . Nutcracker esophagus   . Obsessive-compulsive disorder   . OSA (obstructive sleep apnea)   . 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 Sleep apnea   . Thyroid disease    BP 106/70   Pulse 82   Resp 16   Ht 5\' 2"  (1.575 m)   Wt 219 lb (99.3 kg)   SpO2 95%   BMI 40.06 kg/m   Opioid Risk Score:   Fall Risk Score:  `1  Depression screen PHQ 2/9  Depression screen [facility] 2/9 08/11/2018 02/13/2018 10/24/2017 08/30/2017 07/05/2017  Decreased Interest 2 3 3 3 3   Down, Depressed, Hopeless 2 3 3 3 3   PHQ - 2 Score 4 6 6 6 6   Altered sleeping - - - - 3  Tired, decreased energy - - - - 1  Change in appetite - - - - 0  Feeling bad or failure about yourself  - 3 - - 1  Trouble concentrating - - - - 0  Moving slowly or fidgety/restless - - - - 0  Suicidal thoughts - 0 - - 0  PHQ-9 Score - - - - 11  Difficult doing work/chores - Somewhat difficult - - -    Review of Systems  Constitutional: Negative.   HENT: Negative.   Eyes: Negative.   Respiratory: Negative.   Cardiovascular: Negative.   Gastrointestinal: Negative.   Endocrine: Negative.   Genitourinary: Negative.   Musculoskeletal: Positive for arthralgias and back pain.  Skin: Negative.   Allergic/Immunologic: Negative.   Neurological: Negative.   Hematological: Negative.   Psychiatric/Behavioral: Negative.   All other systems reviewed and are negative.      Objective:   Physical Exam Vitals signs and nursing note reviewed.  Constitutional:      Appearance: Normal appearance. She is obese.  HENT:     Head: Normocephalic and atraumatic.  Musculoskeletal:     Thoracic back: She exhibits tenderness. She exhibits normal range of motion.     Lumbar back: She  exhibits tenderness. She exhibits normal range of motion and no deformity.     Comments: Normal lumbar  and thoracic flexion , ext lateral bending and rotation  Neg SLR  Neurological:     Mental Status: She is alert and oriented to person, place, and time.     Cranial Nerves: Dysarthria and facial asymmetry present.     Motor: Weakness present.     Gait: Gait is intact.     Comments: right facial droop  Left deltoid weakness due to pain and limited ROM    Tenderness bilateral upper trap, Infraspinatus, thoracic and lumbar paraspinal       Assessment & Plan:  1.  Chronic low back pain, myofascial vs Fibromyalgia, no IBS or IC Trial of Lyrica, increase to 75mg  BID RTC 3 mo may need to increase to 100mg  if still not effective  Encourage exercise, wt loss, cat-cow and , "Hula hoop" exercise discussed  2.  Balance disorder with hx TBI 2016 which would suggest a central mechanism, undergoing evaluation with PT  3.  Chronic shoulder pain, insurance denied PENs

## 2018-11-11 NOTE — Addendum Note (Signed)
Addended by: Caro Hight on: 11/11/2018 03:49 PM   Modules accepted: Orders

## 2018-11-11 NOTE — Patient Instructions (Signed)

## 2019-02-05 ENCOUNTER — Encounter: Payer: Self-pay | Admitting: Registered Nurse

## 2019-02-05 ENCOUNTER — Other Ambulatory Visit: Payer: Self-pay

## 2019-02-05 ENCOUNTER — Encounter: Payer: Commercial Managed Care - PPO | Attending: Physical Medicine & Rehabilitation | Admitting: Registered Nurse

## 2019-02-05 VITALS — BP 114/76 | HR 89 | Resp 14 | Ht 62.0 in | Wt 217.0 lb

## 2019-02-05 DIAGNOSIS — M25562 Pain in left knee: Secondary | ICD-10-CM

## 2019-02-05 DIAGNOSIS — G894 Chronic pain syndrome: Secondary | ICD-10-CM | POA: Diagnosis not present

## 2019-02-05 DIAGNOSIS — M797 Fibromyalgia: Secondary | ICD-10-CM

## 2019-02-05 DIAGNOSIS — M7061 Trochanteric bursitis, right hip: Secondary | ICD-10-CM

## 2019-02-05 DIAGNOSIS — G510 Bell's palsy: Secondary | ICD-10-CM | POA: Insufficient documentation

## 2019-02-05 DIAGNOSIS — M7062 Trochanteric bursitis, left hip: Secondary | ICD-10-CM

## 2019-02-05 DIAGNOSIS — M25511 Pain in right shoulder: Secondary | ICD-10-CM

## 2019-02-05 DIAGNOSIS — M545 Low back pain: Secondary | ICD-10-CM

## 2019-02-05 DIAGNOSIS — G8929 Other chronic pain: Secondary | ICD-10-CM

## 2019-02-05 DIAGNOSIS — M25572 Pain in left ankle and joints of left foot: Secondary | ICD-10-CM

## 2019-02-05 DIAGNOSIS — M5416 Radiculopathy, lumbar region: Secondary | ICD-10-CM

## 2019-02-05 DIAGNOSIS — M25561 Pain in right knee: Secondary | ICD-10-CM

## 2019-02-05 DIAGNOSIS — M5459 Other low back pain: Secondary | ICD-10-CM

## 2019-02-05 DIAGNOSIS — M25512 Pain in left shoulder: Secondary | ICD-10-CM

## 2019-02-05 DIAGNOSIS — M25571 Pain in right ankle and joints of right foot: Secondary | ICD-10-CM

## 2019-02-05 MED ORDER — PREGABALIN 75 MG PO CAPS: 75.0000 mg | ORAL_CAPSULE | Freq: Two times a day (BID) | ORAL | 2 refills | Status: DC

## 2019-02-05 MED ORDER — PREGABALIN 100 MG PO CAPS: 100.0000 mg | ORAL_CAPSULE | Freq: Two times a day (BID) | ORAL | 2 refills | Status: DC

## 2019-02-05 NOTE — Progress Notes (Signed)
Subjective:    Patient ID: [name], female    DOB: [birth year], 48 y.o.   MRN: [id number]  HPI: [name] is a 47 y.o. female who returns for follow up appointment for chronic pain and medication refill. She states her pain is located in her bilateral shoulders, lower back radiating into her bilateral lower extremities, bilateral hips and bilateral ankle pain. She rates her pain 8. Her  current exercise regime is walking.   [name] reports no relief in her neuropathic pain with current dosage of Lyrica, Dr. Letta Pate note was reviewed. According to the PMP , [facility] was dispensing Lyrica 50 mg capsule, Dr. Letta Pate increase her to 75 mg capsule BID on her last visit. [facility] was called and was instructed to remove the Lyrica 100 mg order and she will began Lyrica 75 mg BID. [name] was notified regarding the above and verbalizes understanding.  Pain Inventory Average Pain 8 Pain Right Now 8 My pain is intermittent, sharp, burning, stabbing and aching  In the last 24 hours, has pain interfered with the following? General activity 4 Relation with others 3 Enjoyment of life 3 What TIME of day is your pain at its worst? all Sleep (in general) Poor  Pain is worse with: walking, bending, sitting and standing Pain improves with: rest, pacing activities and medication Relief from Meds: 2  Mobility walk without assistance ability to climb steps?  yes do you drive?  no Do you have any goals in this area?  no  Function disabled: date disabled .  Neuro/Psych bladder control problems weakness trouble walking depression anxiety  Prior Studies Any changes since last visit?  no  Physicians involved in your care Any changes since last visit?  no   Family History  Adopted: Yes  Problem Relation Age of Onset  . Depression Mother   . Dementia Mother   . Depression Maternal Grandmother   . Diabetes Father    Social History   Socioeconomic History  . Marital  status: Married    Spouse name: Not on file  . Number of children: 1  . Years of education: college  . Highest education level: Not on file  Occupational History  . Occupation: disabled  Social Needs  . Financial resource strain: Not on file  . Food insecurity:    Worry: Not on file    Inability: Not on file  . Transportation needs:    Medical: Not on file    Non-medical: Not on file  Tobacco Use  . Smoking status: Former Smoker    Packs/day: 2.00    Years: 10.00    Pack years: 20.00    Types: Cigarettes    Start date: 1991    Last attempt to quit: 06/25/2000    Years since quitting: 18.6  . Smokeless tobacco: Never Used  Substance and Sexual Activity  . Alcohol use: No  . Drug use: No  . Sexual activity: Not on file  Lifestyle  . Physical activity:    Days per week: Not on file    Minutes per session: Not on file  . Stress: Not on file  Relationships  . Social connections:    Talks on phone: Not on file    Gets together: Not on file    Attends religious service: Not on file    Active member of club or organization: Not on file    Attends meetings of clubs or organizations: Not on file    Relationship  status: Not on file  Other Topics Concern  . Not on file  Social History Narrative   Caffeine - patient is trying to stop drinking caffeine (she drinks 1 "huge" cup of caffeine daily)   Patient is right handed.   Past Surgical History:  Procedure Laterality Date  . ABDOMINAL HYSTERECTOMY    . ans     2001, 2010  . ANS unit     . CESAREAN SECTION    . CESAREAN SECTION     2007  . DILATION AND CURETTAGE OF UTERUS     2007  . DNC    . ESOPHAGEAL MANOMETRY N/A 04/29/2015   Procedure: ESOPHAGEAL MANOMETRY (EM);  Surgeon: Arta Silence, MD;  Location: [facility];  Service: Endoscopy;  Laterality: N/A;  . gallbladder removed    . GALLBLADDER SURGERY     1998  . SHOULDER SURGERY  1998   plate placed  . stretching of esophagus    . Tens unit     04/2014    Past Medical History:  Diagnosis Date  . Anxiety   . Asthma   . Bell's palsy   . Bipolar disorder ([place])   . Cancer ([place])    skin  . Chronic headache disorder 03/08/2015  . Collapsed lung    right lung  . COPD (chronic obstructive pulmonary disease) ([place])   . Depression   . Dislocated shoulder   . Dyslipidemia   . Fatigue   . Fibromyalgia   . GERD (gastroesophageal reflux disease)   . Hypercholesteremia   . Migraine   . Nutcracker esophagus   . Obsessive-compulsive disorder   . OSA (obstructive sleep apnea)   . 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 Sleep apnea   . Thyroid disease    BP 114/76   Pulse 89   Resp 14   Ht 5\' 2"  (1.575 m)   Wt 217 lb (98.4 kg)   SpO2 96%   BMI 39.69 kg/m   Opioid Risk Score:   Fall Risk Score:  `1  Depression screen PHQ 2/9  Depression screen [facility] 2/9 08/11/2018 02/13/2018 10/24/2017 08/30/2017 07/05/2017  Decreased Interest 2 3 3 3 3   Down, Depressed, Hopeless 2 3 3 3 3   PHQ - 2 Score 4 6 6 6 6   Altered sleeping - - - - 3  Tired, decreased energy - - - - 1  Change in appetite - - - - 0  Feeling bad or failure about yourself  - 3 - - 1  Trouble concentrating - - - - 0  Moving slowly or fidgety/restless - - - - 0  Suicidal thoughts - 0 - - 0  PHQ-9 Score - - - - 11  Difficult doing work/chores - Somewhat difficult - - -    Review of Systems  Constitutional: Negative.   HENT: Negative.   Eyes: Negative.   Respiratory: Negative.   Gastrointestinal: Negative.   Endocrine: Negative.   Genitourinary: Negative.   Musculoskeletal: Positive for gait problem.  Skin: Negative.   Allergic/Immunologic: Negative.   Neurological: Positive for weakness.  Psychiatric/Behavioral: Positive for dysphoric mood. The patient is nervous/anxious.   All other systems reviewed and are negative.      Objective:   Physical Exam Vitals signs and nursing note reviewed.  Constitutional:      Appearance: Normal appearance.  Cardiovascular:      Rate and Rhythm: Normal rate and regular rhythm.  Pulmonary:     Effort: Pulmonary effort is normal.  Breath sounds: Normal breath sounds.  Musculoskeletal:     Comments: Normal Muscle Bulk and Muscle Testing Reveals:  Upper Extremities: Full ROM and Muscle Strength 5/5   Thoracic Paraspinal Tenderness: T-7-T-9 Mainly Right Side   Lumbar Hypersensitivity Bilateral Greater Trochanter Tenderness Lower Extremities: Full ROM and Muscle Strength 5/5  Arises from chair with ease Narrow Based  Gait   Skin:    General: Skin is warm and dry.  Neurological:     Mental Status: She is alert and oriented to person, place, and time.  Psychiatric:        Behavior: Behavior normal.           Assessment & Plan:  1. Chronic Bilateral Shoulder Pain: Continue HEP as Tolerated. Continue to monitor.  2. Lumbar Facet Joint Pain/ Lumbar Radiculitis: Continue HEP as Tolerated and Lyrica 75 mg BID.  3. Fibromyalgia: Continue Lyrica and HEP as tolerated.  4. Bilateral Greater Trochanter Bursitis: Alternate Ice and Heat Therapy. Continue to monitor.  5. Bilateral Chronic Knee Pain: Continue HEP as tolerated. Continue to monitor.  6. Chronic Bilateral Ankle Pain: Continue HEP as tolerated and continue to monitor.   20 minutes of face to face patient care time was spent during this visit. All questions were encouraged and answered.  F/U in 3 months

## 2019-02-10 ENCOUNTER — Ambulatory Visit: Payer: Self-pay | Admitting: Registered Nurse

## 2019-03-13 ENCOUNTER — Telehealth: Payer: Self-pay | Admitting: Physical Medicine & Rehabilitation

## 2019-03-13 NOTE — Telephone Encounter (Signed)
Pt called stating she hit her toe 3 days ago and the top of her pinky toe came off. The Dr office put a pressure bandage on it but would not give pain medication since she was a patient here. She has been taking Tylenol for 3 days. She is asking for pain medication.

## 2019-03-13 NOTE — Telephone Encounter (Signed)
Returned Ms. Kitt call, no answer. Left message to return the call.

## 2019-03-16 ENCOUNTER — Telehealth: Payer: Self-pay | Admitting: Registered Nurse

## 2019-03-16 ENCOUNTER — Encounter: Payer: Self-pay | Admitting: Registered Nurse

## 2019-03-16 ENCOUNTER — Other Ambulatory Visit: Payer: Self-pay

## 2019-03-16 ENCOUNTER — Encounter: Payer: Commercial Managed Care - PPO | Attending: Physical Medicine & Rehabilitation | Admitting: Registered Nurse

## 2019-03-16 VITALS — BP 115/75 | HR 90 | Temp 98.5°F | Resp 12 | Ht 62.0 in | Wt 219.0 lb

## 2019-03-16 DIAGNOSIS — M5416 Radiculopathy, lumbar region: Secondary | ICD-10-CM | POA: Diagnosis not present

## 2019-03-16 DIAGNOSIS — M25562 Pain in left knee: Secondary | ICD-10-CM

## 2019-03-16 DIAGNOSIS — M25561 Pain in right knee: Secondary | ICD-10-CM

## 2019-03-16 DIAGNOSIS — M545 Low back pain: Secondary | ICD-10-CM | POA: Insufficient documentation

## 2019-03-16 DIAGNOSIS — G8929 Other chronic pain: Secondary | ICD-10-CM

## 2019-03-16 DIAGNOSIS — M7061 Trochanteric bursitis, right hip: Secondary | ICD-10-CM

## 2019-03-16 DIAGNOSIS — M5459 Other low back pain: Secondary | ICD-10-CM

## 2019-03-16 DIAGNOSIS — G894 Chronic pain syndrome: Secondary | ICD-10-CM | POA: Diagnosis not present

## 2019-03-16 DIAGNOSIS — G510 Bell's palsy: Secondary | ICD-10-CM

## 2019-03-16 DIAGNOSIS — M797 Fibromyalgia: Secondary | ICD-10-CM

## 2019-03-16 DIAGNOSIS — M7062 Trochanteric bursitis, left hip: Secondary | ICD-10-CM

## 2019-03-16 DIAGNOSIS — M542 Cervicalgia: Secondary | ICD-10-CM

## 2019-03-16 DIAGNOSIS — M7918 Myalgia, other site: Secondary | ICD-10-CM

## 2019-03-16 DIAGNOSIS — M5412 Radiculopathy, cervical region: Secondary | ICD-10-CM

## 2019-03-16 DIAGNOSIS — M79675 Pain in left toe(s): Secondary | ICD-10-CM

## 2019-03-16 NOTE — Telephone Encounter (Signed)
[name] reports she stumped her toe on 03/13/2019 and her toe nail came off. She called office today request pain medication. She will come into the office for an  Appointment  Today to be evaluated. She verbalizes understanding.

## 2019-03-16 NOTE — Progress Notes (Signed)
Subjective:    Patient ID: [name], female    DOB: 1971-07-11, 48 y.o.   MRN: [id number]  HPI: [name] is a 48 y.o. female who returns for follow up appointment for chronic pain and medication refill. She states her pain is located in her neck radiating into her bilateral shoulders, lower-back radiating into her bilateral lower extremities, bilateral knees and left pinky toe. She  rates her pain 9. Her current exercise regime is walking and performing stretching exercises.  [name] stumped her toe on 03/13/2019 and her pinky toe nail came off, she seen her PCP and she reports they applied a dressing. She called office yesterday asking if she can be seen due to increase intensity of pain.  [name] was requesting pain medication. Left Pinky toe  Dressing was change, site was cleansed and new dressing applied. No drainage noted.     Pain Inventory Average Pain 8 Pain Right Now 9 My pain is sharp, burning, stabbing and aching  In the last 24 hours, has pain interfered with the following? General activity 7 Relation with others 8 Enjoyment of life 9 What TIME of day is your pain at its worst? all the time Sleep (in general) n/a  Pain is worse with: walking and some activites Pain improves with: heat/ice and medication Relief from Meds: 2  Mobility ability to climb steps?  yes do you drive?  yes  Function disabled: date disabled n/a  Neuro/Psych bladder control problems weakness  Prior Studies n/a  Physicians involved in your care n/a   Family History  Adopted: Yes  Problem Relation Age of Onset  . Depression Mother   . Dementia Mother   . Depression Maternal Grandmother   . Diabetes Father    Social History   Socioeconomic History  . Marital status: Married    Spouse name: Not on file  . Number of children: 1  . Years of education: college  . Highest education level: Not on file  Occupational History  . Occupation: disabled  Social Needs  .  Financial resource strain: Not on file  . Food insecurity    Worry: Not on file    Inability: Not on file  . Transportation needs    Medical: Not on file    Non-medical: Not on file  Tobacco Use  . Smoking status: Former Smoker    Packs/day: 2.00    Years: 10.00    Pack years: 20.00    Types: Cigarettes    Start date: 14    Quit date: 06/25/2000    Years since quitting: 18.7  . Smokeless tobacco: Never Used  Substance and Sexual Activity  . Alcohol use: No  . Drug use: No  . Sexual activity: Not on file  Lifestyle  . Physical activity    Days per week: Not on file    Minutes per session: Not on file  . Stress: Not on file  Relationships  . Social Herbalist on phone: Not on file    Gets together: Not on file    Attends religious service: Not on file    Active member of club or organization: Not on file    Attends meetings of clubs or organizations: Not on file    Relationship status: Not on file  Other Topics Concern  . Not on file  Social History Narrative   Caffeine - patient is trying to stop drinking caffeine (she drinks 1 "huge" cup of  caffeine daily)   Patient is right handed.   Past Surgical History:  Procedure Laterality Date  . ABDOMINAL HYSTERECTOMY    . ans     2001, 2010  . ANS unit     . CESAREAN SECTION    . CESAREAN SECTION     2007  . DILATION AND CURETTAGE OF UTERUS     2007  . DNC    . ESOPHAGEAL MANOMETRY N/A 04/29/2015   Procedure: ESOPHAGEAL MANOMETRY (EM);  Surgeon: Arta Silence, MD;  Location: [facility];  Service: Endoscopy;  Laterality: N/A;  . gallbladder removed    . GALLBLADDER SURGERY     1998  . SHOULDER SURGERY  1998   plate placed  . stretching of esophagus    . Tens unit     04/2014   Past Medical History:  Diagnosis Date  . Anxiety   . Asthma   . Bell's palsy   . Bipolar disorder ([place])   . Cancer ([place])    skin  . Chronic headache disorder 03/08/2015  . Collapsed lung    right lung  . COPD (chronic  obstructive pulmonary disease) ([place])   . Depression   . Dislocated shoulder   . Dyslipidemia   . Fatigue   . Fibromyalgia   . GERD (gastroesophageal reflux disease)   . Hypercholesteremia   . Migraine   . Nutcracker esophagus   . Obsessive-compulsive disorder   . OSA (obstructive sleep apnea)   . 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 Sleep apnea   . Thyroid disease    BP 115/75 (BP Location: Left Arm, Patient Position: Sitting, Cuff Size: Normal)   Pulse 90   Temp 98.5 F (36.9 C)   Resp 12   Ht 5\' 2"  (1.575 m)   Wt 219 lb (99.3 kg)   SpO2 96%   BMI 40.06 kg/m   Opioid Risk Score:   Fall Risk Score:  `1  Depression screen PHQ 2/9  Depression screen [facility] 2/9 08/11/2018 02/13/2018 10/24/2017 08/30/2017 07/05/2017  Decreased Interest 2 3 3 3 3   Down, Depressed, Hopeless 2 3 3 3 3   PHQ - 2 Score 4 6 6 6 6   Altered sleeping - - - - 3  Tired, decreased energy - - - - 1  Change in appetite - - - - 0  Feeling bad or failure about yourself  - 3 - - 1  Trouble concentrating - - - - 0  Moving slowly or fidgety/restless - - - - 0  Suicidal thoughts - 0 - - 0  PHQ-9 Score - - - - 11  Difficult doing work/chores - Somewhat difficult - - -      Review of Systems  All other systems reviewed and are negative.      Objective:   Physical Exam Vitals signs and nursing note reviewed.  Constitutional:      Appearance: Normal appearance.  Neck:     Musculoskeletal: Normal range of motion and neck supple.     Comments: Cervical Paraspinal Tenderness: C-5-C-6 Cardiovascular:     Rate and Rhythm: Normal rate and regular rhythm.     Pulses: Normal pulses.     Heart sounds: Normal heart sounds.  Pulmonary:     Effort: Pulmonary effort is normal.     Breath sounds: Normal breath sounds.  Musculoskeletal:     Comments: Normal Muscle Bulk and Muscle Testing Reveals:  Upper Extremities: Full ROM and Muscle Strength 5/5 Bilateral AC Joint Tenderness  Thoracic and Lumbar  Hypersensitivity Lower Extremities: Full ROM and Muscle Strength 5/5 Arises from chair with ease Narrow Based Gait   Skin:    General: Skin is warm and dry.  Neurological:     Mental Status: She is alert and oriented to person, place, and time.           Assessment & Plan:  1. Cervicalgia/ Cervical Radiculitis: Continue Lyrica. Continue to Monitor. 03/16/2019 2.Chronic Bilateral Shoulder Pain: Continue HEP as Tolerated. Continue to monitor. 03/16/2019.  3. Lumbar Facet Joint Pain/ Lumbar Radiculitis: Continue HEP as Tolerated and Lyrica 75 mg BID. 03/16/2019 4. Fibromyalgia: Continue Lyrica and HEP as tolerated. 03/16/2019 5. Bilateral Greater Trochanter Bursitis: No Complaints Today. Continue to Alternate Ice and Heat Therapy. Continue to monitor. 03/16/2019. 5. Bilateral Chronic Knee Pain: Continue HEP as tolerated. Continue to monitor.  6. Chronic Bilateral Ankle Pain: No Complaints Today.Continue HEP as tolerated and continue to monitor. 03/16/2019 7.Left Toe Pain: See HPI. dressing Changed. Continue to monitor.   20 minutes of face to face patient care time was spent during this visit. All questions were encouraged and answered.  F/U in 3 months

## 2019-03-16 NOTE — Telephone Encounter (Signed)
Patient called back returning call.  

## 2019-03-16 NOTE — Telephone Encounter (Signed)
I had [name] on the phone, and she wanted to know if you could see her today....she said she has ran out of her medicine and they did surgery on her toes and she is in a lot of pain

## 2019-04-09 ENCOUNTER — Other Ambulatory Visit: Payer: Self-pay | Admitting: Physical Medicine & Rehabilitation

## 2019-04-09 ENCOUNTER — Telehealth: Payer: Self-pay | Admitting: Physical Medicine & Rehabilitation

## 2019-04-09 NOTE — Telephone Encounter (Signed)
Patient needs a refill on Lyrica.  Please call patient.

## 2019-04-10 NOTE — Telephone Encounter (Signed)
Patient called and notified.

## 2019-05-05 ENCOUNTER — Ambulatory Visit: Payer: Commercial Managed Care - PPO | Admitting: Physical Medicine & Rehabilitation

## 2019-05-08 ENCOUNTER — Encounter
Payer: Commercial Managed Care - PPO | Attending: Physical Medicine & Rehabilitation | Admitting: Physical Medicine & Rehabilitation

## 2019-05-08 ENCOUNTER — Ambulatory Visit (HOSPITAL_COMMUNITY)
Admission: EM | Admit: 2019-05-08 | Discharge: 2019-05-08 | Disposition: A | Discharge status: Home / Self Care | Payer: Commercial Managed Care - PPO | Attending: Family Medicine | Admitting: Family Medicine

## 2019-05-08 ENCOUNTER — Encounter (HOSPITAL_COMMUNITY): Payer: Self-pay

## 2019-05-08 ENCOUNTER — Other Ambulatory Visit: Payer: Self-pay

## 2019-05-08 ENCOUNTER — Encounter: Payer: Self-pay | Admitting: Physical Medicine & Rehabilitation

## 2019-05-08 ENCOUNTER — Ambulatory Visit (INDEPENDENT_AMBULATORY_CARE_PROVIDER_SITE_OTHER): Payer: Commercial Managed Care - PPO

## 2019-05-08 VITALS — BP 114/75 | HR 75 | Temp 98.6°F | Ht 62.5 in | Wt 220.8 lb

## 2019-05-08 DIAGNOSIS — G5602 Carpal tunnel syndrome, left upper limb: Secondary | ICD-10-CM | POA: Diagnosis not present

## 2019-05-08 DIAGNOSIS — S93602A Unspecified sprain of left foot, initial encounter: Secondary | ICD-10-CM

## 2019-05-08 DIAGNOSIS — S93402A Sprain of unspecified ligament of left ankle, initial encounter: Secondary | ICD-10-CM

## 2019-05-08 DIAGNOSIS — G510 Bell's palsy: Secondary | ICD-10-CM | POA: Insufficient documentation

## 2019-05-08 DIAGNOSIS — M545 Low back pain: Secondary | ICD-10-CM | POA: Diagnosis present

## 2019-05-08 DIAGNOSIS — M797 Fibromyalgia: Secondary | ICD-10-CM | POA: Diagnosis not present

## 2019-05-08 DIAGNOSIS — M5459 Other low back pain: Secondary | ICD-10-CM

## 2019-05-08 MED ORDER — ACETAMINOPHEN-CODEINE #3 300-30 MG PO TABS: 1.0000 | ORAL_TABLET | Freq: Three times a day (TID) | ORAL | 0 refills | Status: DC | PRN

## 2019-05-08 NOTE — ED Triage Notes (Signed)
Pt states she fell yesterday she thinks she twisted her ankle. (left )

## 2019-05-08 NOTE — ED Notes (Signed)
No shoe post op given pt has shoe at home.

## 2019-05-08 NOTE — ED Provider Notes (Signed)
[place]    CSN: [id number] Arrival date & time: 05/08/19  0818      History   Chief Complaint Chief Complaint  Patient presents with  . Fall    HPI [name] is a 48 y.o. female.   HPI  Patient states she fell yesterday in her kitchen.  She is here for left foot and ankle pain.  She is in a wheelchair.  She states it hurts with weightbearing.  No prior foot and ankle problems.  Past Medical History:  Diagnosis Date  . Anxiety   . Asthma   . Bell's palsy   . Bipolar disorder ([place])   . Cancer ([place])    skin  . Chronic headache disorder 03/08/2015  . Collapsed lung    right lung  . COPD (chronic obstructive pulmonary disease) ([place])   . Depression   . Dislocated shoulder   . Dyslipidemia   . Fatigue   . Fibromyalgia   . GERD (gastroesophageal reflux disease)   . Hypercholesteremia   . Migraine   . Nutcracker esophagus   . Obsessive-compulsive disorder   . OSA (obstructive sleep apnea)   . 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 Sleep apnea   . Thyroid disease     Patient Active Problem List   Diagnosis Date Noted  . Myofascial muscle pain 11/11/2018  . Chronic bilateral low back pain with bilateral sciatica 06/30/2018  . Chronic pain of both knees 05/08/2018  . Chronic ankle pain 05/08/2018  . Dyspnea 08/31/2016  . AKI (acute kidney injury) ([place]) 08/31/2016  . Chest pain 08/31/2016  . Chronic headache disorder 03/08/2015  . Chest pain syndrome 11/18/2014  . Dyslipidemia 11/18/2014  . Sleep apnea with use of continuous positive airway pressure (CPAP) 09/03/2013  . 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 Contusion of face, scalp, and neck except eye(s) 11/08/2012  . Mechanical complication of nervous system device, implant, and graft 11/08/2012  . Obstructive sleep apnea 11/08/2012  . Fibromyalgia 12/25/2011  . Depression, major 12/25/2011  . Bell's palsy 12/25/2011  . Chronic pain 12/25/2011  . GERD (gastroesophageal reflux  disease) 12/25/2011    Past Surgical History:  Procedure Laterality Date  . ABDOMINAL HYSTERECTOMY    . ans     2001, 2010  . ANS unit     . CESAREAN SECTION    . CESAREAN SECTION     2007  . DILATION AND CURETTAGE OF UTERUS     2007  . DNC    . ESOPHAGEAL MANOMETRY N/A 04/29/2015   Procedure: ESOPHAGEAL MANOMETRY (EM);  Surgeon: Arta Silence, MD;  Location: [facility];  Service: Endoscopy;  Laterality: N/A;  . gallbladder removed    . GALLBLADDER SURGERY     1998  . SHOULDER SURGERY  1998   plate placed  . stretching of esophagus    . Tens unit     04/2014    OB History   No obstetric history on file.      Home Medications    Prior to Admission medications   Medication Sig Start Date End Date Taking? Authorizing Provider  acetaminophen (TYLENOL) 325 MG tablet Take 650 mg by mouth every 6 (six) hours as needed.    [provider]  acetaminophen-codeine (TYLENOL #3) 300-30 MG tablet Take 1 tablet by mouth every 8 (eight) hours as needed for moderate pain. 05/08/19   Kirsteins, Luanna Salk, MD  acyclovir (ZOVIRAX) 400 MG tablet Take 400 mg by mouth  daily.  06/27/18   [provider]  albuterol (PROVENTIL HFA;VENTOLIN HFA) 108 (90 BASE) MCG/ACT inhaler Inhale 1 puff into the lungs every 6 (six) hours as needed for wheezing or shortness of breath.    [provider]  albuterol (PROVENTIL) (2.5 MG/3ML) 0.083% nebulizer solution Take 2.5 mg by nebulization every 6 (six) hours as needed for wheezing or shortness of breath.    [provider]  BREO ELLIPTA 200-25 MCG/INH AEPB Inhale 1 puff into the lungs daily.  09/11/16   [provider]  Cariprazine HCl (VRAYLAR) 4.5 MG CAPS Take 4.5 mg by mouth every morning.     [provider]  cetirizine (ZYRTEC) 10 MG tablet Take 10 mg by mouth daily.  06/27/18   [provider]  cyclobenzaprine (FLEXERIL) 5 MG tablet Take 5 mg by mouth 3 (three) times daily as needed for muscle  spasms.    Kirsteins, Luanna Salk, MD  dexmethylphenidate (FOCALIN XR) 20 MG 24 hr capsule Take 20 mg by mouth daily.    [provider]  dexmethylphenidate (FOCALIN) 10 MG tablet Take 10 mg by mouth 3 (three) times daily.     [provider]  doxepin (SINEQUAN) 50 MG capsule Take 100 mg by mouth at bedtime.  06/26/18   [provider]  FLUoxetine (PROZAC) 40 MG capsule Take 80 mg by mouth daily.  06/26/18   [provider]  fluticasone (FLONASE) 50 MCG/ACT nasal spray Place 2 sprays into the nose 2 (two) times daily.     [provider]  lamoTRIgine (LAMICTAL) 200 MG tablet Take 400 mg by mouth at bedtime.     [provider]  levothyroxine (SYNTHROID, LEVOTHROID) 75 MCG tablet Take 75 mcg by mouth daily before breakfast.    [provider]  meloxicam (MOBIC) 15 MG tablet TAKE 1 TABLET BY MOUTH EVERY DAY AS NEEDED 06/27/18   [provider]  Multiple Vitamin (MULTIVITAMIN WITH MINERALS) TABS tablet Take 1 tablet by mouth daily.    [provider]  nitroGLYCERIN (NITROSTAT) 0.4 MG SL tablet Place 0.4 mg under the tongue every 5 (five) minutes as needed for chest pain.    [provider]  prazosin (MINIPRESS) 5 MG capsule Take 5 mg by mouth at bedtime.    [provider]  pregabalin (LYRICA) 75 MG capsule TAKE ONE CAPSULE BY MOUTH TWICE DAILY 04/09/19   Bayard Hugger, NP  propranolol ER (INDERAL LA) 60 MG 24 hr capsule Take 60 mg by mouth daily.     [provider]    Family History Family History  Adopted: Yes  Problem Relation Age of Onset  . Depression Mother   . Dementia Mother   . Depression Maternal Grandmother   . Diabetes Father     Social History Social History   Tobacco Use  . Smoking status: Former Smoker    Packs/day: 2.00    Years: 10.00    Pack years: 20.00    Types: Cigarettes    Start date: 11    Quit date: 06/25/2000    Years since quitting: 18.8  .  Smokeless tobacco: Never Used  Substance Use Topics  . Alcohol use: No  . Drug use: No     Allergies   Flagyl [metronidazole], Dilaudid [hydromorphone hcl], Ketorolac tromethamine, Nsaids, Salicylates, Iodinated diagnostic agents, Prednisone, and Tape   Review of Systems Review of Systems  Constitutional: Negative for chills and fever.  HENT: Negative for ear pain and sore throat.  Eyes: Negative for pain and visual disturbance.  Respiratory: Negative for cough and shortness of breath.   Cardiovascular: Negative for chest pain and palpitations.  Gastrointestinal: Negative for abdominal pain and vomiting.  Genitourinary: Negative for dysuria and hematuria.  Musculoskeletal: Positive for gait problem. Negative for arthralgias and back pain.  Skin: Negative for color change and rash.  Neurological: Negative for seizures and syncope.  All other systems reviewed and are negative.    Physical Exam Triage Vital Signs ED Triage Vitals  Enc Vitals Group     BP 05/08/19 0840 121/83     Pulse Rate 05/08/19 0840 85     Resp 05/08/19 0840 15     Temp 05/08/19 0840 98.3 F (36.8 C)     Temp Source 05/08/19 0840 Oral     SpO2 05/08/19 0840 98 %     Weight 05/08/19 0838 215 lb (97.5 kg)     Height --      Head Circumference --      Peak Flow --      Pain Score 05/08/19 0838 10     Pain Loc --      Pain Edu? --      Excl. in [place]? --    No data found.  Updated Vital Signs BP 121/83 (BP Location: Right Arm)   Pulse 85   Temp 98.3 F (36.8 C) (Oral)   Resp 15   Wt 97.5 kg   SpO2 98%   BMI 39.32 kg/m       Physical Exam Constitutional:      General: She is not in acute distress.    Appearance: She is well-developed. She is obese.  HENT:     Head: Normocephalic and atraumatic.  Eyes:     Conjunctiva/sclera: Conjunctivae normal.     Pupils: Pupils are equal, round, and reactive to light.  Neck:     Musculoskeletal: Normal range of motion.  Cardiovascular:     Rate  and Rhythm: Normal rate.  Pulmonary:     Effort: Pulmonary effort is normal. No respiratory distress.  Abdominal:     General: There is no distension.     Palpations: Abdomen is soft.  Musculoskeletal: Normal range of motion.     Comments: Patient is in a wheelchair.  Left foot and ankle are examined.  There is no swelling or ecchymosis noted.  Diffuse tenderness to palpation of the metatarsals.  Normal sensation.  Normal cap refill.  No tenderness of the heel, ankle joint, or lateral malleolus/medial malleolus.  Exaggerated pain response with even light palpation of entire foot  Skin:    General: Skin is warm and dry.  Neurological:     Mental Status: She is alert.      UC Treatments / Results  Labs (all labs ordered are listed, but only abnormal results are displayed) Labs Reviewed - No data to display  EKG   Radiology Dg Foot Complete Left  Result Date: 05/08/2019 CLINICAL DATA:  Pain following EXAM: LEFT FOOT - COMPLETE 3+ VIEW COMPARISON:  June 05, 2018 FINDINGS: Frontal, oblique, and lateral views obtained. There is no appreciable fracture or dislocation. Joint spaces appear normal. No erosive change. IMPRESSION: No fracture or dislocation.  No evident arthropathy. Electronically Signed   By: Lowella Grip III M.D.   On: 05/08/2019 09:15    Procedures Procedures (including critical care time)  Medications Ordered in UC Medications - No data to display  Initial Impression / Assessment and Plan / UC  Course  I have reviewed the triage vital signs and the nursing notes.  Pertinent labs & imaging results that were available during my care of the patient were reviewed by me and considered in my medical decision making (see chart for details).     Fracture shoe was recommended to help support the foot.  Ice and elevation.  Pain medicine not offered since patient is under pain contract for ongoing conditions.  Follow-up with PCP Final Clinical Impressions(s) / UC  Diagnoses   Final diagnoses:  Foot sprain, left, initial encounter     Discharge Instructions     Ice Elevate Limit walking Wear boot for comfort See your PCP in follow up    ED Prescriptions    None     Controlled Substance Prescriptions [place] Controlled Substance Registry consulted? Not Applicable   Raylene Everts, MD 05/08/19 [phone]

## 2019-05-08 NOTE — Progress Notes (Signed)
Subjective:    Patient ID: [name], female    DOB: Sep 20, 1970, 48 y.o.   MRN: [id number]  HPI Sprained Left ankle this morning, she feels like she stepped on the side of her foot.  She had done this about a year ago.  She did not lose consciousness.  She did not feel dizzy when she stepped wrong.  She did not fall Went to urgent care at [facility].  X-rays were negative for fracture dislocation there is no joint space abnormalities.  No changes compared to 05/2018.  Left hand tingling in thumb index and middle finger, ongoing for 1.5 months.  No wrist injury.  No change over time No weakness in hand , no new neck issues  The patient states she drives with her left hand.  She denies any repetitive motion.  Pain Inventory Average Pain 8 Pain Right Now 9 My pain is constant, sharp, tingling and aching  In the last 24 hours, has pain interfered with the following? General activity 4 Relation with others 5 Enjoyment of life 6 What TIME of day is your pain at its worst? all Sleep (in general) Poor  Pain is worse with: walking, bending, sitting, inactivity and standing Pain improves with: heat/ice Relief from Meds: na  Mobility how many minutes can you walk? 1/2 min ability to climb steps?  yes do you drive?  yes Do you have any goals in this area?  no  Function disabled: date disabled na  Neuro/Psych bladder control problems weakness numbness tremor tingling spasms depression anxiety  Prior Studies x-rays  Physicians involved in your care Any changes since last visit?  no   Family History  Adopted: Yes  Problem Relation Age of Onset  . Depression Mother   . Dementia Mother   . Depression Maternal Grandmother   . Diabetes Father    Social History   Socioeconomic History  . Marital status: Married    Spouse name: Not on file  . Number of children: 1  . Years of education: college  . Highest education level: Not on file  Occupational History  .  Occupation: disabled  Social Needs  . Financial resource strain: Not on file  . Food insecurity    Worry: Not on file    Inability: Not on file  . Transportation needs    Medical: Not on file    Non-medical: Not on file  Tobacco Use  . Smoking status: Former Smoker    Packs/day: 2.00    Years: 10.00    Pack years: 20.00    Types: Cigarettes    Start date: 4    Quit date: 06/25/2000    Years since quitting: 18.8  . Smokeless tobacco: Never Used  Substance and Sexual Activity  . Alcohol use: No  . Drug use: No  . Sexual activity: Yes  Lifestyle  . Physical activity    Days per week: Not on file    Minutes per session: Not on file  . Stress: Not on file  Relationships  . Social Herbalist on phone: Not on file    Gets together: Not on file    Attends religious service: Not on file    Active member of club or organization: Not on file    Attends meetings of clubs or organizations: Not on file    Relationship status: Not on file  Other Topics Concern  . Not on file  Social History Narrative   Caffeine -  patient is trying to stop drinking caffeine (she drinks 1 "huge" cup of caffeine daily)   Patient is right handed.   Past Surgical History:  Procedure Laterality Date  . ABDOMINAL HYSTERECTOMY    . ans     2001, 2010  . ANS unit     . CESAREAN SECTION    . CESAREAN SECTION     2007  . DILATION AND CURETTAGE OF UTERUS     2007  . DNC    . ESOPHAGEAL MANOMETRY N/A 04/29/2015   Procedure: ESOPHAGEAL MANOMETRY (EM);  Surgeon: Arta Silence, MD;  Location: [facility];  Service: Endoscopy;  Laterality: N/A;  . gallbladder removed    . GALLBLADDER SURGERY     1998  . SHOULDER SURGERY  1998   plate placed  . stretching of esophagus    . Tens unit     04/2014   Past Medical History:  Diagnosis Date  . Anxiety   . Asthma   . Bell's palsy   . Bipolar disorder ([place])   . Cancer ([place])    skin  . Chronic headache disorder 03/08/2015  . Collapsed lung     right lung  . COPD (chronic obstructive pulmonary disease) ([place])   . Depression   . Dislocated shoulder   . Dyslipidemia   . Fatigue   . Fibromyalgia   . GERD (gastroesophageal reflux disease)   . Hypercholesteremia   . Migraine   . Nutcracker esophagus   . Obsessive-compulsive disorder   . OSA (obstructive sleep apnea)   . 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 Sleep apnea   . Thyroid disease    BP 114/75   Pulse 75   Temp 98.6 F (37 C)   Ht 5' 2.5" (1.588 m)   Wt 220 lb 12.8 oz (100.2 kg)   SpO2 95%   BMI 39.74 kg/m   Opioid Risk Score:   Fall Risk Score:  `1  Depression screen PHQ 2/9  Depression screen [facility] 2/9 08/11/2018 02/13/2018 10/24/2017 08/30/2017 07/05/2017  Decreased Interest 2 3 3 3 3   Down, Depressed, Hopeless 2 3 3 3 3   PHQ - 2 Score 4 6 6 6 6   Altered sleeping - - - - 3  Tired, decreased energy - - - - 1  Change in appetite - - - - 0  Feeling bad or failure about yourself  - 3 - - 1  Trouble concentrating - - - - 0  Moving slowly or fidgety/restless - - - - 0  Suicidal thoughts - 0 - - 0  PHQ-9 Score - - - - 11  Difficult doing work/chores - Somewhat difficult - - -  Some recent data might be hidden    Review of Systems  Constitutional: Negative.   HENT: Negative.   Eyes: Negative.   Respiratory: Negative.   Cardiovascular: Negative.   Gastrointestinal: Negative.   Endocrine: Negative.   Genitourinary: Negative.   Musculoskeletal: Negative.   Skin: Negative.   Allergic/Immunologic: Negative.   Neurological: Positive for weakness and numbness.  Hematological: Negative.   Psychiatric/Behavioral: Positive for dysphoric mood. The patient is nervous/anxious.   All other systems reviewed and are negative.      Objective:   Physical Exam Constitutional:      Appearance: Normal appearance.  Eyes:     Extraocular Movements: Extraocular movements intact.     Pupils: Pupils are equal, round, and reactive to light.  Musculoskeletal:      Left wrist: She  exhibits normal range of motion, no tenderness, no effusion and no deformity.     Left ankle: She exhibits decreased range of motion. She exhibits no swelling and no deformity. Tenderness. Lateral malleolus and AITFL tenderness found. Achilles tendon normal.     Lumbar back: She exhibits bony tenderness. She exhibits normal range of motion and no deformity.     Left hand: She exhibits normal range of motion. Decreased sensation noted. Decreased sensation is present in the medial distribution. Decreased sensation is not present in the ulnar distribution and is not present in the radial distribution. Normal strength noted.     Comments: Negative Tinel's at left wrist Pain with inversion of the left ankle Mild paraspinal tenderness in the lumbar area Patient has full forward flexion she has difficulty raising up from a forward flexed position.  Skin:    General: Skin is warm and dry.  Neurological:     General: No focal deficit present.     Mental Status: She is alert and oriented to person, place, and time.  Psychiatric:        Attention and Perception: Attention and perception normal.        Mood and Affect: Mood normal.        Speech: Speech normal.        Behavior: Behavior normal.        Cognition and Memory: Cognition and memory normal.           Assessment & Plan:   1.  Ankle inversion sprain, x-rays negative, instructions given for ankle sprain.  This include rest ice elevation.  She does have crutches at home I instructed her to use these for a couple days until the pain starts improving.  She may need to do some ankle rehabilitation afterwards given that she has a history of recurrent ankle sprains.  5-day supply of Tylenol 3 No. 15 tablets no refills for acute pain  #2.  Hand tingling left hand may be carpal tunnel related.  May benefit from wrist splint.  If no improvement may consider EMG/NCV which can be done at this office  3.  Chronic low back pain  history of lumbar spondylosis we discussed that she should do her exercise program every day rather than weekly.    Continue Lyrica for chronic pain syndrome, 75 mg twice a day.

## 2019-05-08 NOTE — Patient Instructions (Addendum)
5 day supply tylenol l with codeine  Ice 78min to Left ankle  Ankle Sprain  An ankle sprain is a stretch or tear in one of the tough tissues (ligaments) that connect the bones in your ankle. An ankle sprain can happen when the ankle rolls outward (inversion sprain) or inward (eversion sprain). What are the causes? This condition is caused by rolling or twisting the ankle. What increases the risk? You are more likely to develop this condition if you play sports. What are the signs or symptoms? Symptoms of this condition include:  Pain in your ankle.  Swelling.  Bruising. This may happen right after you sprain your ankle or 1-2 days later.  Trouble standing or walking. How is this diagnosed? This condition is diagnosed with:  A physical exam. During the exam, your doctor will press on certain parts of your foot and ankle and try to move them in certain ways.  X-ray imaging. These may be taken to see how bad the sprain is and to check for broken bones. How is this treated? This condition may be treated with:  A brace or splint. This is used to keep the ankle from moving until it heals.  An elastic bandage. This is used to support the ankle.  Crutches.  Pain medicine.  Surgery. This may be needed if the sprain is very bad.  Physical therapy. This may help to improve movement in the ankle. Follow these instructions at home: If you have a brace or a splint:  Wear the brace or splint as told by your doctor. Remove it only as told by your doctor.  Loosen the brace or splint if your toes: ? Tingle. ? Lose feeling (become numb). ? Turn cold and blue.  Keep the brace or splint clean.  If the brace or splint is not waterproof: ? Do not let it get wet. ? Cover it with a watertight covering when you take a bath or a shower. If you have an elastic bandage (dressing):  Remove it to shower or bathe.  Try not to move your ankle much, but wiggle your toes from time to time.  This helps to prevent swelling.  Adjust the dressing if it feels too tight.  Loosen the dressing if your foot: ? Loses feeling. ? Tingles. ? Becomes cold and blue. Managing pain, stiffness, and swelling   Take over-the-counter and prescription medicines only as told by doctor.  For 2-3 days, keep your ankle raised (elevated) above the level of your heart.  If told, put ice on the injured area: ? If you have a removable brace or splint, remove it as told by your doctor. ? Put ice in a plastic bag. ? Place a towel between your skin and the bag. ? Leave the ice on for 20 minutes, 2-3 times a day. General instructions  Rest your ankle.  Do not use your injured leg to support your body weight until your doctor says that you can. Use crutches as told by your doctor.  Do not use any products that contain nicotine or tobacco, such as cigarettes, e-cigarettes, and chewing tobacco. If you need help quitting, ask your doctor.  Keep all follow-up visits as told by your doctor. Contact a doctor if:  Your bruises or swelling are quickly getting worse.  Your pain does not get better after you take medicine. Get help right away if:  You cannot feel your toes or foot.  Your foot or toes look blue.  You have  very bad pain that gets worse. Summary  An ankle sprain is a stretch or tear in one of the tough tissues (ligaments) that connect the bones in your ankle.  This condition is caused by rolling or twisting the ankle.  Symptoms include pain, swelling, bruising, and trouble walking.  To help with pain and swelling, put ice on the injured ankle, raise your ankle above the level of your heart, and use an elastic bandage. Also, rest as told by your doctor.  Keep all follow-up visits as told by your doctor. This is important. This information is not intended to replace advice given to you by your health care provider. Make sure you discuss any questions you have with your health care  provider. Document Released: 02/20/2008 Document Revised: 01/28/2018 Document Reviewed: 01/28/2018 Elsevier Patient Education  2020 [employer].

## 2019-05-08 NOTE — Discharge Instructions (Addendum)
Ice Elevate Limit walking Wear boot for comfort See your PCP in follow up

## 2019-07-06 ENCOUNTER — Other Ambulatory Visit: Payer: Self-pay | Admitting: Physical Medicine & Rehabilitation

## 2019-07-09 ENCOUNTER — Other Ambulatory Visit: Payer: Self-pay | Admitting: Registered Nurse

## 2019-07-22 ENCOUNTER — Other Ambulatory Visit: Payer: Self-pay

## 2019-07-22 ENCOUNTER — Emergency Department (HOSPITAL_COMMUNITY)
Admission: EM | Admit: 2019-07-22 | Discharge: 2019-07-24 | Disposition: A | Discharge status: Other Acute Inpatient Hospital | Payer: Commercial Managed Care - PPO | Attending: Emergency Medicine | Admitting: Emergency Medicine

## 2019-07-22 ENCOUNTER — Encounter (HOSPITAL_COMMUNITY): Payer: Self-pay | Admitting: Emergency Medicine

## 2019-07-22 DIAGNOSIS — Z85820 Personal history of malignant melanoma of skin: Secondary | ICD-10-CM | POA: Diagnosis not present

## 2019-07-22 DIAGNOSIS — R0789 Other chest pain: Secondary | ICD-10-CM | POA: Insufficient documentation

## 2019-07-22 DIAGNOSIS — R4182 Altered mental status, unspecified: Secondary | ICD-10-CM | POA: Diagnosis present

## 2019-07-22 DIAGNOSIS — J449 Chronic obstructive pulmonary disease, unspecified: Secondary | ICD-10-CM | POA: Insufficient documentation

## 2019-07-22 DIAGNOSIS — Z20828 Contact with and (suspected) exposure to other viral communicable diseases: Secondary | ICD-10-CM | POA: Diagnosis not present

## 2019-07-22 DIAGNOSIS — Z87891 Personal history of nicotine dependence: Secondary | ICD-10-CM | POA: Insufficient documentation

## 2019-07-22 DIAGNOSIS — F250 Schizoaffective disorder, bipolar type: Secondary | ICD-10-CM | POA: Diagnosis not present

## 2019-07-22 DIAGNOSIS — J45909 Unspecified asthma, uncomplicated: Secondary | ICD-10-CM | POA: Insufficient documentation

## 2019-07-22 DIAGNOSIS — Z79899 Other long term (current) drug therapy: Secondary | ICD-10-CM | POA: Insufficient documentation

## 2019-07-22 LAB — COMPREHENSIVE METABOLIC PANEL
ALT: 26 U/L (ref 0–44)
AST: 21 U/L (ref 15–41)
Albumin: 4.4 g/dL (ref 3.5–5.0)
Alkaline Phosphatase: 74 U/L (ref 38–126)
Anion gap: 12 (ref 5–15)
BUN: 15 mg/dL (ref 6–20)
CO2: 21 mmol/L — ABNORMAL LOW (ref 22–32)
Calcium: 9.6 mg/dL (ref 8.9–10.3)
Chloride: 104 mmol/L (ref 98–111)
Creatinine, Ser: 0.67 mg/dL (ref 0.44–1.00)
GFR calc Af Amer: >60 mL/min (ref >60)
GFR calc non Af Amer: >60 mL/min (ref >60)
Glucose, Bld: 123 mg/dL — ABNORMAL HIGH (ref 70–99)
Potassium: 3.3 mmol/L — ABNORMAL LOW (ref 3.5–5.1)
Sodium: 137 mmol/L (ref 135–145)
Total Bilirubin: 0.9 mg/dL (ref 0.3–1.2)
Total Protein: 7.4 g/dL (ref 6.5–8.1)

## 2019-07-22 LAB — I-STAT BETA HCG BLOOD, ED (MC, WL, AP ONLY): I-stat hCG, quantitative: <5 m[IU]/mL (ref <5)

## 2019-07-22 LAB — CBC
HCT: 43.5 % (ref 36.0–46.0)
Hemoglobin: 14.9 g/dL (ref 12.0–15.0)
MCH: 28.4 pg (ref 26.0–34.0)
MCHC: 34.3 g/dL (ref 30.0–36.0)
MCV: 83 fL (ref 80.0–100.0)
Platelets: 284 10*3/uL (ref 150–400)
RBC: 5.24 MIL/uL — ABNORMAL HIGH (ref 3.87–5.11)
RDW: 12.9 % (ref 11.5–15.5)
WBC: 9.4 10*3/uL (ref 4.0–10.5)
nRBC: 0 % (ref 0.0–0.2)

## 2019-07-22 MED ORDER — SODIUM CHLORIDE 0.9% FLUSH: 3.0000 mL | Freq: Once | INTRAVENOUS | Status: DC

## 2019-07-22 NOTE — ED Triage Notes (Addendum)
Patient presents to the ED by EMS with c/o [facility] for 2 days. Her dad found her, she takes medications for psych and back pain. She repeats "help and dad" only. 12 lead NSR.   Nerve stimulator in place per husband who notes "she is on a lot of medications, recently they changed her throat medications and upped them. I am not sure if this is from that."    124/90 98 P 16 R 99% RA CBG 111

## 2019-07-23 ENCOUNTER — Other Ambulatory Visit: Payer: Self-pay

## 2019-07-23 ENCOUNTER — Emergency Department (HOSPITAL_COMMUNITY): Payer: Commercial Managed Care - PPO

## 2019-07-23 LAB — CBG MONITORING, ED: Glucose-Capillary: 114 mg/dL — ABNORMAL HIGH (ref 70–99)

## 2019-07-23 LAB — RAPID URINE DRUG SCREEN, HOSP PERFORMED
Amphetamines: NOT DETECTED
Barbiturates: NOT DETECTED
Benzodiazepines: NOT DETECTED
Cocaine: NOT DETECTED
Opiates: NOT DETECTED
Tetrahydrocannabinol: NOT DETECTED

## 2019-07-23 LAB — URINALYSIS, ROUTINE W REFLEX MICROSCOPIC
Bilirubin Urine: NEGATIVE
Glucose, UA: NEGATIVE mg/dL
Hgb urine dipstick: NEGATIVE
Ketones, ur: 5 mg/dL — AB
Leukocytes,Ua: NEGATIVE
Nitrite: NEGATIVE
Protein, ur: NEGATIVE mg/dL
Specific Gravity, Urine: 1.024 (ref 1.005–1.030)
pH: 5 (ref 5.0–8.0)

## 2019-07-23 LAB — SARS CORONAVIRUS 2 (TAT 6-24 HRS): SARS Coronavirus 2: NEGATIVE

## 2019-07-23 MED ORDER — POTASSIUM CHLORIDE CRYS ER 20 MEQ PO TBCR
20.0000 meq | EXTENDED_RELEASE_TABLET | Freq: Once | ORAL | Status: AC
  Administered 2019-07-24: 20 meq via ORAL
  Filled 2019-07-23: qty 1

## 2019-07-23 NOTE — ED Notes (Signed)
Pt has been accepted to [facility], [place], [place] [address]. Accepting MD [facility].   RN to give report to [phone] after 7:15 am

## 2019-07-23 NOTE — ED Notes (Addendum)
Pt noncooperative. Pt laying on stomach not allowing RN to place purewick. Pt only nods head no to RN for yes or no.

## 2019-07-23 NOTE — ED Notes (Signed)
Assisted patient to bathroom. After several minutes, I opened the door to check on patient. She was sitting on the floor cross-legged and disoriented. Assisted patient back to bed. She was unstable in her steps.

## 2019-07-23 NOTE — ED Notes (Signed)
Per pt facial palsy is baseline

## 2019-07-23 NOTE — BH Assessment (Signed)
Tele Assessment Note   Patient Name: [name] MRN: [id number] Referring Physician: Dr. Leonette [facility] Location of Patient: [facility] Location of Provider: Behavioral Health TTS Department  [name] is an 48 y.o. female. Pt was not oriented to name, day, and situation.   Collateral information obtained from [name] husband [name]. Per Mr. Minnis the Pt has physical and mental health needs. Mr. Zurfluh states that the Pt is "on a lot of medications." Per Mr. Dolinsky the Pt is on pain medication but it was recently reduced because she gave a friend one of her pills and the [name] doctor found out. The Pt then started a medication for Jackhammer esophagus and the Pt immediately started behaving bizarrely. Mr. Ziff states that the Pt has been diagnosed with Schizoaffective, Bipolar disorder and is seen at [facility]. Mr. 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   Rashon recommends inpatient treatment. Rashon recommends a neuro consult.  Diagnosis: F25.0 Schizoaffective, Bipolar  Past Medical History:  Past Medical History:  Diagnosis Date  . Anxiety   . Asthma   . Bell's palsy   . Bipolar disorder ([place])   . Cancer ([place])    skin  . Chronic headache disorder 03/08/2015  . Collapsed lung    right lung  . COPD (chronic obstructive pulmonary disease) ([place])   . Depression   . Dislocated shoulder   . Dyslipidemia   . Fatigue   . Fibromyalgia   . GERD (gastroesophageal reflux disease)   . Hypercholesteremia   . Migraine   . Nutcracker esophagus   . Obsessive-compulsive disorder   . OSA (obstructive sleep apnea)   . 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 Sleep apnea   . Thyroid disease     Past Surgical History:  Procedure Laterality Date  . ABDOMINAL HYSTERECTOMY    . ans     2001, 2010  . ANS unit     . CESAREAN SECTION    . CESAREAN SECTION     2007  . DILATION AND CURETTAGE OF UTERUS     2007  . DNC    . ESOPHAGEAL  MANOMETRY N/A 04/29/2015   Procedure: ESOPHAGEAL MANOMETRY (EM);  Surgeon: Arta Silence, MD;  Location: [facility];  Service: Endoscopy;  Laterality: N/A;  . gallbladder removed    . GALLBLADDER SURGERY     1998  . SHOULDER SURGERY  1998   plate placed  . stretching of esophagus    . Tens unit     04/2014    Family History:  Family History  Adopted: Yes  Problem Relation Age of Onset  . Depression Mother   . Dementia Mother   . Depression Maternal Grandmother   . Diabetes Father     Social History:  reports that she quit smoking about 19 years ago. Her smoking use included cigarettes. She started smoking about 29 years ago. She has a 20.00 pack-year smoking history. She has never used smokeless tobacco. She reports that she does not drink alcohol or use drugs.  Additional Social History:  Alcohol / Drug Use Pain Medications: please see mar Prescriptions: please see mar Over the Counter: please see mar History of alcohol / drug use?: No history of alcohol / drug abuse Longest period of sobriety (when/how long): NA  CIWA: CIWA-Ar BP: 118/68 Pulse Rate: 82 COWS:    Allergies:  Allergies  Allergen Reactions  . Flagyl [  Metronidazole] Anaphylaxis and Swelling    Tongue swelling/ confusion  . Dilaudid [Hydromorphone Hcl] Itching  . Ketorolac Tromethamine Swelling  . Nsaids     Can't take for fibromyalgia   . Salicylates Other (See Comments)    unknown  . Iodinated Diagnostic Agents Other (See Comments) and Rash    unknown  . Prednisone Anxiety and Other (See Comments)    "anxiety"  . Tape Itching, Dermatitis, Rash and Other (See Comments)    Blisters - any type of tape and Band-Aids        Home Medications: (Not in a hospital admission)   OB/GYN Status:  No LMP recorded. Patient has had a hysterectomy.  General Assessment Data Location of Assessment: [facility] ED TTS Assessment: In system Is this a Tele or Face-to-Face Assessment?: Tele Assessment Is this an  Initial Assessment or a Re-assessment for this encounter?: Initial Assessment Patient Accompanied by:: N/A Language Other than English: No Living Arrangements: Homeless/Shelter What gender do you identify as?: Female Marital status: Married Pharmacist, community name: [place] Pregnancy Status: No Living Arrangements: Spouse/significant other, Children Can pt return to current living arrangement?: Yes Admission Status: Voluntary Is patient capable of signing voluntary admission?: Yes Referral Source: Self/Family/Friend Insurance type: [place]     Crisis Care Plan Living Arrangements: Spouse/significant other, Children Legal Guardian: Other:(self) Name of Psychiatrist: NA Name of Therapist: NA  Education Status Is patient currently in school?: No Is the patient employed, unemployed or receiving disability?: Unemployed  Risk to self with the past 6 months Suicidal Ideation: No Has patient been a risk to self within the past 6 months prior to admission? : No Suicidal Intent: No Has patient had any suicidal intent within the past 6 months prior to admission? : No Is patient at risk for suicide?: No Suicidal Plan?: No Has patient had any suicidal plan within the past 6 months prior to admission? : No Access to Means: No What has been your use of drugs/alcohol within the last 12 months?: NA Previous Attempts/Gestures: No How many times?: 0 Other Self Harm Risks: NA Triggers for Past Attempts: None known Intentional Self Injurious Behavior: None Family Suicide History: No Recent stressful life event(s): Other (Comment)(physical health) Persecutory voices/beliefs?: No Depression: Yes Depression Symptoms: Tearfulness, Isolating Substance abuse history and/or treatment for substance abuse?: No Suicide prevention information given to non-admitted patients: Not applicable  Risk to Others within the past 6 months Homicidal Ideation: No Does patient have any lifetime risk of violence toward  others beyond the six months prior to admission? : No Thoughts of Harm to Others: No Current Homicidal Intent: No Current Homicidal Plan: No Access to Homicidal Means: No Identified Victim: NA History of harm to others?: No Assessment of Violence: None Noted Violent Behavior Description: NA Does patient have access to weapons?: No Criminal Charges Pending?: No Does patient have a court date: No Is patient on probation?: No  Psychosis Hallucinations: None noted Delusions: None noted  Mental Status Report Appearance/Hygiene: Unremarkable Eye Contact: Fair Motor Activity: Freedom of movement Speech: Pressured, Soft, Slow, Tangential Level of Consciousness: Drowsy Mood: Anxious Affect: Anxious Anxiety Level: Minimal Thought Processes: Tangential Judgement: Impaired Orientation: Place Obsessive Compulsive Thoughts/Behaviors: None  Cognitive Functioning Concentration: Decreased Memory: Recent Impaired, Remote Impaired Is patient IDD: No Insight: Poor Impulse Control: Poor Appetite: Fair Have you had any weight changes? : No Change Sleep: No Change Total Hours of Sleep: 8 Vegetative Symptoms: None  ADLScreening [facility] Assessment Services) Patient's cognitive ability adequate to safely complete daily activities?:  Yes Patient able to express need for assistance with ADLs?: Yes Independently performs ADLs?: Yes (appropriate for developmental age)  Prior Inpatient Therapy Prior Inpatient Therapy: No  Prior Outpatient Therapy Prior Outpatient Therapy: Yes Prior Therapy Dates: current Prior Therapy Facilty/Provider(s): [facility] Reason for Treatment: Schizoaffective Does patient have an ACCT team?: No Does patient have Intensive In-House Services?  : No Does patient have [facility] services? : No Does patient have P4CC services?: No  ADL Screening (condition at time of admission) Patient's cognitive ability adequate to safely complete daily activities?: Yes Is  the patient deaf or have difficulty hearing?: No Does the patient have difficulty concentrating, remembering, or making decisions?: No Patient able to express need for assistance with ADLs?: Yes Does the patient have difficulty dressing or bathing?: No Independently performs ADLs?: Yes (appropriate for developmental age)       Abuse/Neglect Assessment (Assessment to be complete while patient is alone) Abuse/Neglect Assessment Can Be Completed: Yes Physical Abuse: Denies Verbal Abuse: Denies Sexual Abuse: Denies Exploitation of patient/patient's resources: Denies     Regulatory affairs officer (For Healthcare) Does Patient Have a Medical Advance Directive?: No Would patient like information on creating a medical advance directive?: No - Patient declined          Disposition:  Disposition Initial Assessment Completed for this Encounter: Yes  This service was provided via telemedicine using a 2-way, interactive audio and video technology.  Names of all persons participating in this telemedicine service and their role in this encounter. Name: Ky Harpole Role: Husband  Name:  Role:   Name:  Role:   Name:  Role:     Cyndia Bent 07/23/2019 10:54 AM

## 2019-07-23 NOTE — BH Assessment (Signed)
Per [name], patient accepted to [facility] pending IVC (after 7am). Accepting provider is Dr. Skip Mayer. Nurse report [phone]. Room number will be assigned upon her arrival. Patient's nurse Dalphine Handing) made aware of patient disposition.  IVC will be completed by EDP at [facility] and faxed to the magistrate for processing.

## 2019-07-23 NOTE — ED Notes (Signed)
Pt ambulated to bathroom 

## 2019-07-23 NOTE — ED Notes (Signed)
Dinner tray has been provided.

## 2019-07-23 NOTE — ED Provider Notes (Signed)
[facility] EMERGENCY DEPARTMENT Provider Note   CSN: [id number] Arrival date & time: 07/22/19  1629     History   Chief Complaint Chief Complaint  Patient presents with  . Altered Mental Status  . Drug Overdose    HPI [name] is a 48 y.o. female.     Patient with history of COPD, HLD, GERD, OCD, fibromyalgia, migraine presents to ED with husband who reports that for the past 2-3 days she has had an altered mental status with symptoms of being nonverbal, making one word statements, not eating. Husband states she was started on a new medication 3 days ago and family felt the change might be due to this. Unsure what the medication was. Per husband, no vomiting, complaint by the patient of pain, no fever, no diarrhea.   The history is provided by the patient and the spouse. No language interpreter was used.  Altered Mental Status Drug Overdose    Past Medical History:  Diagnosis Date  . Anxiety   . Asthma   . Bell's palsy   . Bipolar disorder ([place])   . Cancer ([place])    skin  . Chronic headache disorder 03/08/2015  . Collapsed lung    right lung  . COPD (chronic obstructive pulmonary disease) ([place])   . Depression   . Dislocated shoulder   . Dyslipidemia   . Fatigue   . Fibromyalgia   . GERD (gastroesophageal reflux disease)   . Hypercholesteremia   . Migraine   . Nutcracker esophagus   . Obsessive-compulsive disorder   . OSA (obstructive sleep apnea)   . 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 Sleep apnea   . Thyroid disease     Patient Active Problem List   Diagnosis Date Noted  . Myofascial muscle pain 11/11/2018  . Chronic bilateral low back pain with bilateral sciatica 06/30/2018  . Chronic pain of both knees 05/08/2018  . Chronic ankle pain 05/08/2018  . Dyspnea 08/31/2016  . AKI (acute kidney injury) ([place]) 08/31/2016  . Chest pain 08/31/2016  . Chronic headache disorder 03/08/2015  . Chest pain syndrome 11/18/2014  .  Dyslipidemia 11/18/2014  . Sleep apnea with use of continuous positive airway pressure (CPAP) 09/03/2013  . 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 Contusion of face, scalp, and neck except eye(s) 11/08/2012  . Mechanical complication of nervous system device, implant, and graft 11/08/2012  . Obstructive sleep apnea 11/08/2012  . Fibromyalgia 12/25/2011  . Depression, major 12/25/2011  . Bell's palsy 12/25/2011  . Chronic pain 12/25/2011  . GERD (gastroesophageal reflux disease) 12/25/2011    Past Surgical History:  Procedure Laterality Date  . ABDOMINAL HYSTERECTOMY    . ans     2001, 2010  . ANS unit     . CESAREAN SECTION    . CESAREAN SECTION     2007  . DILATION AND CURETTAGE OF UTERUS     2007  . DNC    . ESOPHAGEAL MANOMETRY N/A 04/29/2015   Procedure: ESOPHAGEAL MANOMETRY (EM);  Surgeon: Arta Silence, MD;  [facility];  Service: Endoscopy;  Laterality: N/A;  . gallbladder removed    . GALLBLADDER SURGERY     1998  . SHOULDER SURGERY  1998   plate placed  . stretching of esophagus    . Tens unit     04/2014     OB History   No obstetric history on file.      Home Medications  Prior to Admission medications   Medication Sig Start Date End Date Taking? Authorizing Provider  acetaminophen (TYLENOL) 325 MG tablet Take 650 mg by mouth every 6 (six) hours as needed.    [provider]  acetaminophen-codeine (TYLENOL #3) 300-30 MG tablet Take 1 tablet by mouth every 8 (eight) hours as needed for moderate pain. 05/08/19   Kirsteins, Luanna Salk, MD  acyclovir (ZOVIRAX) 400 MG tablet Take 400 mg by mouth daily.  06/27/18   [provider]  albuterol (PROVENTIL HFA;VENTOLIN HFA) 108 (90 BASE) MCG/ACT inhaler Inhale 1 puff into the lungs every 6 (six) hours as needed for wheezing or shortness of breath.    [provider]  albuterol (PROVENTIL) (2.5 MG/3ML) 0.083% nebulizer solution Take 2.5 mg by nebulization every 6 (six) hours  as needed for wheezing or shortness of breath.    [provider]  BREO ELLIPTA 200-25 MCG/INH AEPB Inhale 1 puff into the lungs daily.  09/11/16   [provider]  Cariprazine HCl (VRAYLAR) 4.5 MG CAPS Take 4.5 mg by mouth every morning.     [provider]  cetirizine (ZYRTEC) 10 MG tablet Take 10 mg by mouth daily.  06/27/18   [provider]  cyclobenzaprine (FLEXERIL) 5 MG tablet Take 5 mg by mouth 3 (three) times daily as needed for muscle spasms.    Kirsteins, Luanna Salk, MD  dexmethylphenidate (FOCALIN XR) 20 MG 24 hr capsule Take 20 mg by mouth daily.    [provider]  dexmethylphenidate (FOCALIN) 10 MG tablet Take 10 mg by mouth 3 (three) times daily.     [provider]  doxepin (SINEQUAN) 50 MG capsule Take 100 mg by mouth at bedtime.  06/26/18   [provider]  FLUoxetine (PROZAC) 40 MG capsule Take 80 mg by mouth daily.  06/26/18   [provider]  fluticasone (FLONASE) 50 MCG/ACT nasal spray Place 2 sprays into the nose 2 (two) times daily.     [provider]  lamoTRIgine (LAMICTAL) 200 MG tablet Take 400 mg by mouth at bedtime.     [provider]  levothyroxine (SYNTHROID, LEVOTHROID) 75 MCG tablet Take 75 mcg by mouth daily before breakfast.    [provider]  meloxicam (MOBIC) 15 MG tablet TAKE 1 TABLET BY MOUTH EVERY DAY AS NEEDED 06/27/18   [provider]  Multiple Vitamin (MULTIVITAMIN WITH MINERALS) TABS tablet Take 1 tablet by mouth daily.    [provider]  nitroGLYCERIN (NITROSTAT) 0.4 MG SL tablet Place 0.4 mg under the tongue every 5 (five) minutes as needed for chest pain.    [provider]  prazosin (MINIPRESS) 5 MG capsule Take 5 mg by mouth at bedtime.    [provider]  pregabalin (LYRICA) 50 MG capsule TAKE 1 CAPSULE BY MOUTH TWICE DAILY 07/06/19   Kirsteins, Luanna Salk, MD  pregabalin (LYRICA) 75 MG capsule TAKE ONE CAPSULE BY  MOUTH TWICE DAILY 07/09/19   Kirsteins, Luanna Salk, MD  propranolol ER (INDERAL LA) 60 MG 24 hr capsule Take 60 mg by mouth daily.     [provider]    Family History Family History  Adopted: Yes  Problem Relation Age of Onset  . Depression Mother   . Dementia Mother   . Depression Maternal Grandmother   . Diabetes Father     Social History Social History   Tobacco Use  . Smoking status: Former Smoker    Packs/day: 2.00    Years:  10.00    Pack years: 20.00    Types: Cigarettes    Start date: 89    Quit date: 06/25/2000    Years since quitting: 19.0  . Smokeless tobacco: Never Used  Substance Use Topics  . Alcohol use: No  . Drug use: No     Allergies   Flagyl [metronidazole], Dilaudid [hydromorphone hcl], Ketorolac tromethamine, Nsaids, Salicylates, Iodinated diagnostic agents, Prednisone, and Tape   Review of Systems Review of Systems  Unable to perform ROS: Mental status change     Physical Exam Updated Vital Signs BP 109/60   Pulse 77   Temp 98 F (36.7 C) (Oral)   Resp 19   SpO2 97%   Physical Exam Vitals signs and nursing note reviewed.  Constitutional:      Comments: Patient is awake, opens eyes to name being called, does not answer questions  HENT:     Head: Normocephalic and atraumatic.     Nose: Nose normal.     Mouth/Throat:     Mouth: Mucous membranes are dry.  Eyes:     Conjunctiva/sclera: Conjunctivae normal.     Pupils: Pupils are equal, round, and reactive to light.  Neck:     Musculoskeletal: Normal range of motion and neck supple.  Cardiovascular:     Rate and Rhythm: Normal rate and regular rhythm.     Heart sounds: No murmur.  Pulmonary:     Effort: Pulmonary effort is normal.     Breath sounds: No wheezing, rhonchi or rales.  Chest:     Chest wall: Tenderness (Tender across anterior chest wall) present.  Abdominal:     General: There is no distension.     Palpations: Abdomen is soft.  Musculoskeletal:      Comments: Moving all extremities.  Skin:    General: Skin is warm and dry.  Neurological:     Comments: Patient is not verbally responsive. She repeats words she hears sporadically. She follows commands: "squeeze hands", "lift left foot", "open eyes". There is a slight left facial droop, chronic with h/o Bell's Palsy.      ED Treatments / Results  Labs (all labs ordered are listed, but only abnormal results are displayed) Labs Reviewed  COMPREHENSIVE METABOLIC PANEL - Abnormal; Notable for the following components:      Result Value   Potassium 3.3 (*)    CO2 21 (*)    Glucose, Bld 123 (*)    All other components within normal limits  CBC - Abnormal; Notable for the following components:   RBC 5.24 (*)    All other components within normal limits  CBG MONITORING, ED  I-STAT BETA HCG BLOOD, ED ([facility], [facility], [facility] ONLY)   Results for orders placed or performed during the hospital encounter of 07/22/19  Comprehensive metabolic panel  Result Value Ref Range   Sodium 137 135 - 145 mmol/L   Potassium 3.3 (L) 3.5 - 5.1 mmol/L   Chloride 104 98 - 111 mmol/L   CO2 21 (L) 22 - 32 mmol/L   Glucose, Bld 123 (H) 70 - 99 mg/dL   BUN 15 6 - 20 mg/dL   Creatinine, Ser 0.67 0.44 - 1.00 mg/dL   Calcium 9.6 8.9 - 10.3 mg/dL   Total Protein 7.4 6.5 - 8.1 g/dL   Albumin 4.4 3.5 - 5.0 g/dL   AST 21 15 - 41 U/L   ALT 26 0 - 44 U/L   Alkaline Phosphatase 74 38 - 126 U/L  Total Bilirubin 0.9 0.3 - 1.2 mg/dL   GFR calc non Af Amer >60 >60 mL/min   GFR calc Af Amer >60 >60 mL/min   Anion gap 12 5 - 15  CBC  Result Value Ref Range   WBC 9.4 4.0 - 10.5 K/uL   RBC 5.24 (H) 3.87 - 5.11 MIL/uL   Hemoglobin 14.9 12.0 - 15.0 g/dL   HCT 43.5 36.0 - 46.0 %   MCV 83.0 80.0 - 100.0 fL   MCH 28.4 26.0 - 34.0 pg   MCHC 34.3 30.0 - 36.0 g/dL   RDW 12.9 11.5 - 15.5 %   Platelets 284 150 - 400 K/uL   nRBC 0.0 0.0 - 0.2 %  I-Stat beta hCG blood, ED  Result Value Ref Range   I-stat hCG, quantitative <5.0 <5  mIU/mL   Comment 3            EKG None  Radiology No results found.  Procedures Procedures (including critical care time)  Medications Ordered in ED Medications  sodium chloride flush (NS) 0.9 % injection 3 mL (has no administration in time range)     Initial Impression / Assessment and Plan / ED Course  I have reviewed the triage vital signs and the nursing notes.  Pertinent labs & imaging results that were available during my care of the patient were reviewed by me and considered in my medical decision making (see chart for details).        Patient to ED with husband with changed mental status x 2-3 days. No fever or illness. He states concern that she started a new medication on Day 1 of symptoms and that this is contributing. Altered mental status in that she is not talking, is awake, restless, in NAD, but does not talk to husband or family.   The patient is nontoxic in appearance. Nonverbal on exam, though, is periodically repeating single words from conversations around her. She follows detailed commands, including identifying right and left LE movement.   Lab and imaging evaluation is negative for suspicion of physical cause of symptoms.   On re-evaluation, the patient has been observed to be ambulatory in the ED. She is moving independently in the bed. She continues to follow command.   TTS consult pending to determine disposition.   Final Clinical Impressions(s) / ED Diagnoses   Final diagnoses:  None   1. Altered mental status   ED Discharge Orders    None       Charlann Lange, PA-C 07/23/19 [id number]    Ward, Delice Bison, DO 07/24/19 [phone]

## 2019-07-23 NOTE — ED Notes (Signed)
Patient was a cup of Sprite and cup of Diet Coke.

## 2019-07-23 NOTE — ED Notes (Signed)
Pt transferred to room 54. Maroon tshirt on pt. Pt not willing to get up or answer questions. Sternal rub and pt opened her eyes. HOB up and told pt we are going to walk into the other room to get on the bed. Pt helped up and walked to bed. Pt got in bed and would not answer questions. Pts eyes are closed and will not open them, but can see eyes moving around while eyes are closed. Sternal rub and pt opens her eyes.

## 2019-07-23 NOTE — ED Notes (Signed)
Rush Landmark father  [phone]

## 2019-07-23 NOTE — BH Assessment (Signed)
11/5Reinaldo Meeker, NP recommends inpatient treatment. [name] requested the EDP complete a neuro consult due to the [name] neuro-simulator and possible reactions to a medication she is taking for jackhammer esophagus. Patient referred to the following facilities. Pending review:   [facility] Details   [facility] Details   [facility] Details   [facility] Details   [facility] Details   [facility] Details   [facility] Details   [facility] Details   [facility] Details   [facility] Details   [facility] Details   [facility] Details   [facility] Details   [facility] Details   [facility] Details   [facility] Details   [facility] Details   [facility] Details   [facility] Details   [facility]

## 2019-07-23 NOTE — ED Notes (Signed)
Per [facility], pt needs to become IVC to be accepted by facility. Concern pt will not be able to sign self in in current state.

## 2019-07-23 NOTE — ED Notes (Signed)
Pt. Pulled out dentures (full dentures, both top and bottom.) Placed in pt's locker. Locker # 6, added dentures to inventory sheet

## 2019-07-23 NOTE — ED Notes (Signed)
Patient denies pain and is resting comfortably.  

## 2019-07-23 NOTE — ED Notes (Signed)
Dinner tray has been ordered 

## 2019-07-23 NOTE — ED Notes (Signed)
Patient refused to eat and drink breakfast.

## 2019-07-23 NOTE — ED Notes (Signed)
Pt. Previously non-verbal for RN. Pt is able to state her name, but denies any needs. [name] voice is soft.

## 2019-07-23 NOTE — ED Notes (Signed)
[name], husband. [phone] Doren Custard, father. [phone]  Call with updates.

## 2019-07-24 NOTE — ED Notes (Signed)
Breakfast tray ordered 

## 2019-07-24 NOTE — Progress Notes (Signed)
CSW spoke with admissions at [facility]. They will not be able to accept pt without an IVC in place.   Pt has been accepted to [facility] and will be transported this morning per Judson Roch @[facility]  ED.   Audree Camel, LCSW, [place] Disposition [facility]/TTS [phone]

## 2019-07-24 NOTE — ED Notes (Signed)
Spoke with staff at [facility] who states they will accept the patient after 10am. Nurse to call report after 8 am to [phone]. Accepting physician Dr. Dareen Piano.

## 2019-07-24 NOTE — Progress Notes (Signed)
Pharmacy is unable to confirm completely what medications the patient was taking, how she was taking them, or the last time she took any of them. The patient will not participate in an interview. Her husband has been contacted but does not have any information. The patient brought a bag of medications with her and we have checked the "Taking?" box for those on the PTA med list. For medications on the list that she did not bring but had an electronic prescription record, the "Last filled" date has been added (yellow note below the medication).  Let pharmacy know if we can be of further assistance.  Romeo Rabon, PharmD Admin. Coordinator of Transitions of Care 07/24/2019,8:50 AM

## 2019-07-24 NOTE — ED Notes (Signed)
RN requested pharmacy for medication reconciliation

## 2019-07-24 NOTE — ED Notes (Signed)
EDP does not believe pt meets IVC criteria. RN spoke to [place], [place] and [employer] who will continue to find placement for pt.

## 2019-07-24 NOTE — ED Notes (Signed)
Pt is not verbally communicating with staff. When asked questions pt. Will nod. Pt denies any needs at this time

## 2019-07-24 NOTE — Care Management (Signed)
  Patient is under review at the following facilities:    [facility] Details   [facility] Details   [facility] Details   [facility] Details   [facility] Details   [facility] Details   [facility] Details   [facility] Details   [place] Details   [place] [facility] Details   [facility] Details   [place] Details   [facility] Details   [facility] Details   [facility] Details   [facility] Details   [place]

## 2019-07-24 NOTE — ED Notes (Signed)
[name] belongings and medications given to [facility] [facility]. Pt wheeled to ambulance bay and stood to get into vehicle.

## 2019-07-25 DIAGNOSIS — Z046 Encounter for general psychiatric examination, requested by authority: Secondary | ICD-10-CM | POA: Insufficient documentation

## 2019-07-25 DIAGNOSIS — E739 Lactose intolerance, unspecified: Secondary | ICD-10-CM | POA: Insufficient documentation

## 2019-07-25 DIAGNOSIS — R569 Unspecified convulsions: Secondary | ICD-10-CM | POA: Insufficient documentation

## 2019-07-25 DIAGNOSIS — F061 Catatonic disorder due to known physiological condition: Secondary | ICD-10-CM | POA: Insufficient documentation

## 2019-07-25 DIAGNOSIS — F319 Bipolar disorder, unspecified: Secondary | ICD-10-CM | POA: Insufficient documentation

## 2019-09-21 ENCOUNTER — Emergency Department (HOSPITAL_COMMUNITY)
Admission: EM | Admit: 2019-09-21 | Discharge: 2019-09-22 | Discharge status: Against Medical Advice | Payer: Commercial Managed Care - PPO | Attending: Emergency Medicine | Admitting: Emergency Medicine

## 2019-09-21 ENCOUNTER — Encounter (HOSPITAL_COMMUNITY): Payer: Self-pay | Admitting: Emergency Medicine

## 2019-09-21 DIAGNOSIS — Z5321 Procedure and treatment not carried out due to patient leaving prior to being seen by health care provider: Secondary | ICD-10-CM | POA: Diagnosis not present

## 2019-09-21 DIAGNOSIS — R197 Diarrhea, unspecified: Secondary | ICD-10-CM | POA: Diagnosis present

## 2019-09-21 LAB — URINALYSIS, ROUTINE W REFLEX MICROSCOPIC
Bilirubin Urine: NEGATIVE
Glucose, UA: NEGATIVE mg/dL
Hgb urine dipstick: NEGATIVE
Ketones, ur: NEGATIVE mg/dL
Leukocytes,Ua: NEGATIVE
Nitrite: NEGATIVE
Protein, ur: NEGATIVE mg/dL
Specific Gravity, Urine: 1.02 (ref 1.005–1.030)
pH: 5 (ref 5.0–8.0)

## 2019-09-21 LAB — CBC
HCT: 45.2 % (ref 36.0–46.0)
Hemoglobin: 15.1 g/dL — ABNORMAL HIGH (ref 12.0–15.0)
MCH: 27.8 pg (ref 26.0–34.0)
MCHC: 33.4 g/dL (ref 30.0–36.0)
MCV: 83.2 fL (ref 80.0–100.0)
Platelets: 298 10*3/uL (ref 150–400)
RBC: 5.43 MIL/uL — ABNORMAL HIGH (ref 3.87–5.11)
RDW: 12.6 % (ref 11.5–15.5)
WBC: 8.1 10*3/uL (ref 4.0–10.5)
nRBC: 0 % (ref 0.0–0.2)

## 2019-09-21 LAB — COMPREHENSIVE METABOLIC PANEL
ALT: 20 U/L (ref 0–44)
AST: 17 U/L (ref 15–41)
Albumin: 4.2 g/dL (ref 3.5–5.0)
Alkaline Phosphatase: 73 U/L (ref 38–126)
Anion gap: 13 (ref 5–15)
BUN: 10 mg/dL (ref 6–20)
CO2: 26 mmol/L (ref 22–32)
Calcium: 9.8 mg/dL (ref 8.9–10.3)
Chloride: 101 mmol/L (ref 98–111)
Creatinine, Ser: 0.84 mg/dL (ref 0.44–1.00)
GFR calc Af Amer: >60 mL/min (ref >60)
GFR calc non Af Amer: >60 mL/min (ref >60)
Glucose, Bld: 115 mg/dL — ABNORMAL HIGH (ref 70–99)
Potassium: 3.9 mmol/L (ref 3.5–5.1)
Sodium: 140 mmol/L (ref 135–145)
Total Bilirubin: 0.8 mg/dL (ref 0.3–1.2)
Total Protein: 7.2 g/dL (ref 6.5–8.1)

## 2019-09-21 LAB — LIPASE, BLOOD: Lipase: 25 U/L (ref 11–51)

## 2019-09-21 LAB — I-STAT BETA HCG BLOOD, ED (MC, WL, AP ONLY): I-stat hCG, quantitative: <5 m[IU]/mL (ref <5)

## 2019-09-21 MED ORDER — SODIUM CHLORIDE 0.9% FLUSH: 3.0000 mL | Freq: Once | INTRAVENOUS | Status: DC

## 2019-09-21 NOTE — ED Triage Notes (Signed)
Pt. Stated, Ive had diarrhea, chills and nothig taste right. This started a couple of weeks ago.

## 2019-11-09 ENCOUNTER — Encounter: Payer: Commercial Managed Care - PPO | Admitting: Registered Nurse

## 2019-11-24 DIAGNOSIS — G8929 Other chronic pain: Secondary | ICD-10-CM | POA: Insufficient documentation

## 2020-02-14 ENCOUNTER — Emergency Department (HOSPITAL_COMMUNITY): Payer: Commercial Managed Care - PPO

## 2020-02-14 ENCOUNTER — Emergency Department (HOSPITAL_COMMUNITY)
Admission: EM | Admit: 2020-02-14 | Discharge: 2020-02-15 | Disposition: A | Discharge status: Home / Self Care | Payer: Commercial Managed Care - PPO | Attending: Emergency Medicine | Admitting: Emergency Medicine

## 2020-02-14 ENCOUNTER — Other Ambulatory Visit: Payer: Self-pay

## 2020-02-14 ENCOUNTER — Encounter (HOSPITAL_COMMUNITY): Payer: Self-pay | Admitting: Emergency Medicine

## 2020-02-14 DIAGNOSIS — Z79899 Other long term (current) drug therapy: Secondary | ICD-10-CM | POA: Insufficient documentation

## 2020-02-14 DIAGNOSIS — G8929 Other chronic pain: Secondary | ICD-10-CM | POA: Diagnosis not present

## 2020-02-14 DIAGNOSIS — Z87891 Personal history of nicotine dependence: Secondary | ICD-10-CM | POA: Insufficient documentation

## 2020-02-14 DIAGNOSIS — M79661 Pain in right lower leg: Secondary | ICD-10-CM | POA: Insufficient documentation

## 2020-02-14 DIAGNOSIS — Z85828 Personal history of other malignant neoplasm of skin: Secondary | ICD-10-CM | POA: Diagnosis not present

## 2020-02-14 DIAGNOSIS — F319 Bipolar disorder, unspecified: Secondary | ICD-10-CM | POA: Insufficient documentation

## 2020-02-14 DIAGNOSIS — J449 Chronic obstructive pulmonary disease, unspecified: Secondary | ICD-10-CM | POA: Diagnosis not present

## 2020-02-14 DIAGNOSIS — R52 Pain, unspecified: Secondary | ICD-10-CM

## 2020-02-14 DIAGNOSIS — M79651 Pain in right thigh: Secondary | ICD-10-CM | POA: Insufficient documentation

## 2020-02-14 DIAGNOSIS — M79604 Pain in right leg: Secondary | ICD-10-CM

## 2020-02-14 MED ORDER — ENOXAPARIN SODIUM 100 MG/ML ~~LOC~~ SOLN
1.0000 mg/kg | Freq: Once | SUBCUTANEOUS | Status: AC
  Administered 2020-02-15: 95 mg via SUBCUTANEOUS
  Filled 2020-02-14: qty 0.95

## 2020-02-14 MED ORDER — LIDOCAINE 5 % EX PTCH: 1.0000 | MEDICATED_PATCH | CUTANEOUS | 0 refills | Status: DC

## 2020-02-14 NOTE — Discharge Instructions (Addendum)
IMPORTANT PATIENT INSTRUCTIONS:   You have been scheduled for an Outpatient Vascular Study at [facility].    If tomorrow is a Saturday or Sunday, please go to the [facility] Emergency Department registration desk at 8 AM tomorrow morning and tell them you are therefore a vascular study.  If tomorrow is a weekday (Monday - Friday), please go to the [facility] Admitting Department at 8 AM and tell them you are therefore a vascular study  -----  You may alternate taking Tylenol as needed for pain control. You may take [id number] mg of Tylenol every 6 hours. Do not exceed 4000 mg of Tylenol daily as this can lead to liver damage. You may use warm and cold compresses to help with your symptoms.   Please follow up with your primary doctor within the next 7-10 days for re-evaluation and further treatment of your symptoms.   Please return to the ER sooner if you have any new or worsening symptoms.

## 2020-02-14 NOTE — ED Provider Notes (Signed)
[facility] EMERGENCY DEPARTMENT Provider Note   CSN: [id number] Arrival date & time: 02/14/20  1907     History Chief Complaint  Patient presents with  . Leg Pain    [name] is a 49 y.o. female.  HPI   49 year old female with a history of anxiety/depression, bipolar disorder, fibromyalgia, COPD, dyslipidemia, GERD, hyperlipidemia, OCD, OSA, esophagitis, who presents the emergency department today complaining of right lower extremity pain.  States that she began having a sharp pain to the right lower extremity that has been present for the last 2 to 3 days.  Pain is constant in nature and is severe.  Pain is worse when she palpates it or moves in certain positions.  It is worse when she ambulates.  She denies any right lower extremity swelling or calf pain.  She denies any lower back pain, numbness or weakness.  No loss control of bowel or bladder function. Denies trauma, recent long travel, hormone use, or hx of DVT/PE.  Has history of melanoma that was excised.  Had another skin lesion excised from the right thigh about a month ago.  States that she had a telehealth visit today and she was told she needed to be seen in person.  She said she went to urgent care but they sent her here because they did not have ultrasound available.  Past Medical History:  Diagnosis Date  . Anxiety   . Asthma   . Bell's palsy   . Bipolar disorder ([place])   . Cancer ([place])    skin  . Chronic headache disorder 03/08/2015  . Collapsed lung    right lung  . COPD (chronic obstructive pulmonary disease) ([place])   . Depression   . Dislocated shoulder   . Dyslipidemia   . Fatigue   . Fibromyalgia   . GERD (gastroesophageal reflux disease)   . Hypercholesteremia   . Migraine   . Nutcracker esophagus   . Obsessive-compulsive disorder   . OSA (obstructive sleep apnea)   . 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 Sleep apnea   . Thyroid disease     Patient Active Problem List   Diagnosis Date Noted  . Myofascial muscle pain 11/11/2018  . Chronic bilateral low back pain with bilateral sciatica 06/30/2018  . Chronic pain of both knees 05/08/2018  . Chronic ankle pain 05/08/2018  . Dyspnea 08/31/2016  . AKI (acute kidney injury) ([place]) 08/31/2016  . Chest pain 08/31/2016  . Chronic headache disorder 03/08/2015  . Chest pain syndrome 11/18/2014  . Dyslipidemia 11/18/2014  . Sleep apnea with use of continuous positive airway pressure (CPAP) 09/03/2013  . 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 Contusion of face, scalp, and neck except eye(s) 11/08/2012  . Mechanical complication of nervous system device, implant, and graft 11/08/2012  . Obstructive sleep apnea 11/08/2012  . Fibromyalgia 12/25/2011  . Depression, major 12/25/2011  . Bell's palsy 12/25/2011  . Chronic pain 12/25/2011  . GERD (gastroesophageal reflux disease) 12/25/2011    Past Surgical History:  Procedure Laterality Date  . ABDOMINAL HYSTERECTOMY    . ans     2001, 2010  . ANS unit     . CESAREAN SECTION    . CESAREAN SECTION     2007  . DILATION AND CURETTAGE OF UTERUS     2007  . DNC    . ESOPHAGEAL MANOMETRY N/A 04/29/2015   Procedure: ESOPHAGEAL MANOMETRY (EM);  Surgeon: Arta Silence, MD;  Location: [facility] ENDOSCOPY;  Service: Endoscopy;  Laterality: N/A;  . gallbladder removed    . GALLBLADDER SURGERY     1998  . SHOULDER SURGERY  1998   plate placed  . stretching of esophagus    . Tens unit     04/2014     OB History   No obstetric history on file.     Family History  Adopted: Yes  Problem Relation Age of Onset  . Depression Mother   . Dementia Mother   . Depression Maternal Grandmother   . Diabetes Father     Social History   Tobacco Use  . Smoking status: Former Smoker    Packs/day: 2.00    Years: 10.00    Pack years: 20.00    Types: Cigarettes    Start date: 73    Quit date: 06/25/2000    Years since quitting: 19.6  . Smokeless tobacco: Never Used    Substance Use Topics  . Alcohol use: No  . Drug use: No    Home Medications Prior to Admission medications   Medication Sig Start Date End Date Taking? Authorizing Provider  acetaminophen (TYLENOL) 325 MG tablet Take 650 mg by mouth every 6 (six) hours as needed for mild pain.     [provider]  acetaminophen-codeine (TYLENOL #3) 300-30 MG tablet Take 1 tablet by mouth every 8 (eight) hours as needed for moderate pain. 05/08/19   Kirsteins, Luanna Salk, MD  acyclovir (ZOVIRAX) 400 MG tablet Take 400 mg by mouth daily as needed (for herpes).  06/27/18   [provider]  albuterol (PROVENTIL HFA;VENTOLIN HFA) 108 (90 BASE) MCG/ACT inhaler Inhale 1 puff into the lungs every 6 (six) hours as needed for wheezing or shortness of breath.    [provider]  albuterol (PROVENTIL) (2.5 MG/3ML) 0.083% nebulizer solution Take 2.5 mg by nebulization every 6 (six) hours as needed for wheezing or shortness of breath.    [provider]  bisacodyl (DULCOLAX) 5 MG EC tablet Take 5 mg by mouth daily as needed for mild constipation or moderate constipation.    [provider]  BREO ELLIPTA 200-25 MCG/INH AEPB Inhale 1 puff into the lungs daily.  09/11/16   [provider]  Cariprazine HCl (VRAYLAR) 6 MG CAPS Take 1 capsule by mouth daily.    [provider]  cetirizine (ZYRTEC) 10 MG tablet Take 10 mg by mouth daily.  06/27/18   [provider]  clonazePAM (KLONOPIN) 1 MG tablet Take 1 mg by mouth 4 (four) times daily as needed for anxiety. 06/10/19   [provider]  clotrimazole (MYCELEX) 10 MG troche Take 10 mg by mouth 3 (three) times daily as needed (Antifungal).    [provider]  cyclobenzaprine (FLEXERIL) 5 MG tablet Take 5 mg by mouth 3 (three) times daily as needed for muscle spasms.    Kirsteins, Luanna Salk, MD  dexmethylphenidate (FOCALIN XR) 20 MG 24 hr capsule Take 20 mg by mouth daily.    [provider]   dexmethylphenidate (FOCALIN) 10 MG tablet Take 10 mg by mouth 3 (three) times daily.     [provider]  doxepin (SINEQUAN) 50 MG capsule Take 50-100 mg by mouth See admin instructions. Take 1-2 capsules by mouth 45 minutes before bed/sleep 06/26/18   [provider]  FLUoxetine (PROZAC) 40 MG capsule Take 80 mg by mouth daily.  06/26/18   [provider]  fluticasone (FLONASE) 50 MCG/ACT nasal spray Place 2 sprays into the  nose 2 (two) times daily.     [provider]  hydrochlorothiazide (MICROZIDE) 12.5 MG capsule Take 12.5 mg by mouth daily as needed (For Edema).    [provider]  hydrOXYzine (VISTARIL) 25 MG capsule Take 25 mg by mouth 4 (four) times daily as needed. 07/17/19   [provider]  isosorbide mononitrate (IMDUR) 30 MG 24 hr tablet Take 30 mg by mouth daily. 07/07/19   [provider]  isosorbide mononitrate (IMDUR) 60 MG 24 hr tablet Take 60 mg by mouth daily.    [provider]  ketoconazole (NIZORAL) 2 % cream Apply 1 application topically See admin instructions. Apply to Red, Flaky areas of face and/or ears once to three times a week as needed for antifungal    [provider]  lamoTRIgine (LAMICTAL) 200 MG tablet Take 400 mg by mouth at bedtime.     [provider]  levothyroxine (SYNTHROID, LEVOTHROID) 75 MCG tablet Take 75 mcg by mouth daily before breakfast.    [provider]  lidocaine (LIDODERM) 5 % Place 1 patch onto the skin daily. Remove & Discard patch within 12 hours or as directed by MD 02/14/20   Deeksha Cotrell S, PA-C  meloxicam (MOBIC) 15 MG tablet Take 15 mg by mouth daily as needed for pain.  06/27/18   [provider]  metaxalone (SKELAXIN) 800 MG tablet Take 800 mg by mouth 3 (three) times daily. 07/22/19   [provider]  Multiple Vitamin (MULTIVITAMIN WITH MINERALS) TABS tablet Take 1 tablet by mouth daily.    [provider]   Multiple Vitamins-Minerals (HAIR SKIN NAILS PO) Take 1 capsule by mouth daily.    [provider]  nitroGLYCERIN (NITROSTAT) 0.4 MG SL tablet Place 0.4 mg under the tongue every 5 (five) minutes as needed for chest pain.    [provider]  omeprazole (PRILOSEC) 20 MG capsule Take 20 mg by mouth daily.    [provider]  oxybutynin (DITROPAN XL) 15 MG 24 hr tablet Take 15 mg by mouth daily. 03/04/19   [provider]  oxybutynin (DITROPAN-XL) 10 MG 24 hr tablet Take 10 mg by mouth 2 (two) times daily.    [provider]  prazosin (MINIPRESS) 5 MG capsule Take 5 mg by mouth at bedtime.    [provider]  pregabalin (LYRICA) 50 MG capsule TAKE 1 CAPSULE BY MOUTH TWICE DAILY Patient taking differently: Take 50 mg by mouth 2 (two) times daily.  07/06/19   Kirsteins, Luanna Salk, MD  propranolol ER (INDERAL LA) 60 MG 24 hr capsule Take 60 mg by mouth daily.     [provider]    Allergies    Flagyl [metronidazole], Dilaudid [hydromorphone hcl], Ketorolac tromethamine, Nsaids, Salicylates, Iodinated diagnostic agents, Prednisone, and Tape  Review of Systems   Review of Systems  Constitutional: Negative for fever.  Eyes: Negative for visual disturbance.  Respiratory: Negative for shortness of breath.   Cardiovascular: Negative for chest pain.  Gastrointestinal: Negative for abdominal pain.  Genitourinary: Negative for pelvic pain.  Musculoskeletal: Negative for back pain.       Right leg pain  Skin: Negative for wound.  Neurological: Negative for weakness and numbness.    Physical Exam Updated Vital Signs BP 111/74 (BP Location: Right Arm)   Pulse 97   Temp 98.6 F (37 C) (Oral)   Resp 18   Ht 5\' 3"  (1.6 m)   Wt 93 kg   SpO2 95%  BMI 36.31 kg/m   Physical Exam Vitals and nursing note reviewed.  Constitutional:      General: She is not in acute distress.    Appearance: She is well-developed.  HENT:     Head:  Normocephalic and atraumatic.  Eyes:     Conjunctiva/sclera: Conjunctivae normal.  Cardiovascular:     Rate and Rhythm: Normal rate.  Pulmonary:     Effort: Pulmonary effort is normal.  Musculoskeletal:        General: Normal range of motion.     Cervical back: Neck supple.     Comments: TTP from the posterior thigh to the posterior fossa. No appreciable RLE swelling or erythema. No calf TTP. DP pulses 2+ and symmetric. Normal temperature to the BLE.   Skin:    General: Skin is warm and dry.  Neurological:     Mental Status: She is alert.     Comments: 5/5 strength to the bilateral lower extremities.  Able to ambulate without difficulty     ED Results / Procedures / Treatments   Labs (all labs ordered are listed, but only abnormal results are displayed) Labs Reviewed - No data to display  EKG None  Radiology DG FEMUR, MIN 2 VIEWS RIGHT  Result Date: 02/14/2020 CLINICAL DATA:  Pt states she is been having sharp pain since yesterday on the back of her thigh denies any injury, denies any swollen or redness at this time. States she is having hard time to walk. EXAM: RIGHT FEMUR 2 VIEWS COMPARISON:  None. FINDINGS: There is no evidence of fracture or other focal bone lesions in the right femur. No evidence of dislocation at the right hip or knee. Soft tissues are unremarkable. IMPRESSION: Negative right femur radiographs. Electronically Signed   By: Audie Pinto M.D.   On: 02/14/2020 20:25    Procedures Procedures (including critical care time)  Medications Ordered in ED Medications  enoxaparin (LOVENOX) injection 95 mg (has no administration in time range)    ED Course  I have reviewed the triage vital signs and the nursing notes.  Pertinent labs & imaging results that were available during my care of the patient were reviewed by me and considered in my medical decision making (see chart for details).    MDM Rules/Calculators/A&P                     49 year old  female presenting for evaluation of right lower extremity pain for the last several days.  No known injury or trauma.  Denies any significant risk factors for VTE.  On exam she does have the some tenderness to the posterior thigh.  She has no appreciable swelling or calf tenderness on exam.  She is neurovascularly intact.  She is able to ambulate  X-ray of the femur was ordered in triage and did not show any evidence of acute traumatic injury other bony abnormality.  Patient was given a dose of pain medications in the ED.  Will order outpatient lower extremity ultrasound.  If negative feel this is likely related to MSK injury.  She is allergic to most NSAIDs.  Advised Tylenol and will give Lidoderm patches.  She was given a dose of Lovenox given the concern for DVT.  Advised on PCP follow-up and return precautions.  She voiced understanding plan reasons to return.  Questions answered.  Patient able for discharge.   Final Clinical Impression(s) / ED Diagnoses Final diagnoses:  Pain  Right leg pain    Rx /  DC Orders ED Discharge Orders         Ordered    LE VENOUS     02/14/20 2339    lidocaine (LIDODERM) 5 %  Every 24 hours     02/14/20 [place], Latricia [place] S, PA-C 02/14/20 [place], [place], DO 02/15/20 1502

## 2020-02-14 NOTE — ED Triage Notes (Signed)
Pt states she is been having sharp pain since yesterday on the back of her thigh denies any injury, denies any swollen or redness at this time. States she is having hard time to walk.

## 2020-02-15 ENCOUNTER — Ambulatory Visit (HOSPITAL_BASED_OUTPATIENT_CLINIC_OR_DEPARTMENT_OTHER)
Admission: RE | Admit: 2020-02-15 | Discharge: 2020-02-15 | Disposition: A | Discharge status: Home / Self Care | Payer: Commercial Managed Care - PPO | Source: Ambulatory Visit | Attending: Physician Assistant | Admitting: Physician Assistant

## 2020-02-15 ENCOUNTER — Encounter (HOSPITAL_COMMUNITY): Payer: Commercial Managed Care - PPO

## 2020-02-15 DIAGNOSIS — M79609 Pain in unspecified limb: Secondary | ICD-10-CM

## 2020-02-15 DIAGNOSIS — M79651 Pain in right thigh: Secondary | ICD-10-CM | POA: Diagnosis not present

## 2020-02-15 MED ORDER — HYDROCODONE-ACETAMINOPHEN 5-325 MG PO TABS
1.0000 | ORAL_TABLET | Freq: Once | ORAL | Status: AC
  Administered 2020-02-15: 1 via ORAL
  Filled 2020-02-15: qty 1

## 2020-02-15 NOTE — ED Notes (Signed)
Pt verbalizes understanding of d/c instructions. Prescriptions reviewed with patient. Pt taken to lobby via wheelchair at d/c with all belongings and with family.   

## 2020-02-15 NOTE — Progress Notes (Signed)
Lower venous duplex       has been completed. Preliminary results can be found under CV proc through chart review. Ruthel Martine, BS, RDMS, RVT   

## 2020-08-24 ENCOUNTER — Encounter: Payer: Self-pay | Admitting: Neurology

## 2020-08-25 ENCOUNTER — Encounter: Payer: Self-pay | Admitting: Neurology

## 2020-08-25 ENCOUNTER — Ambulatory Visit (INDEPENDENT_AMBULATORY_CARE_PROVIDER_SITE_OTHER): Payer: Commercial Managed Care - PPO | Admitting: Neurology

## 2020-08-25 VITALS — BP 121/80 | HR 91 | Ht 63.0 in | Wt 214.0 lb

## 2020-08-25 DIAGNOSIS — M25561 Pain in right knee: Secondary | ICD-10-CM

## 2020-08-25 DIAGNOSIS — F3175 Bipolar disorder, in partial remission, most recent episode depressed: Secondary | ICD-10-CM | POA: Diagnosis not present

## 2020-08-25 DIAGNOSIS — R5381 Other malaise: Secondary | ICD-10-CM

## 2020-08-25 DIAGNOSIS — G510 Bell's palsy: Secondary | ICD-10-CM

## 2020-08-25 DIAGNOSIS — R5382 Chronic fatigue, unspecified: Secondary | ICD-10-CM

## 2020-08-25 DIAGNOSIS — M25562 Pain in left knee: Secondary | ICD-10-CM

## 2020-08-25 DIAGNOSIS — G8929 Other chronic pain: Secondary | ICD-10-CM

## 2020-08-25 DIAGNOSIS — G4711 Idiopathic hypersomnia with long sleep time: Secondary | ICD-10-CM

## 2020-08-25 NOTE — Patient Instructions (Signed)

## 2020-08-25 NOTE — Progress Notes (Signed)
SLEEP MEDICINE CLINIC    Provider:  Larey Seat, MD  Primary Care Physician:  Berkley Harvey, NP [place] [address]     Referring Provider: Berkley Harvey, [place],   [id number]          Chief Complaint according to patient   Patient presents with:    . New Patient (Initial Visit)           HISTORY OF PRESENT ILLNESS:  [name] is a 49 y.o. year old White or Caucasian female patient who is fast asleep in my exam room and was difficult to arouse. I have last seen her over 3 years-ago. She woke up after I tried to wake her multiple times reacted startled and somewhat regressed.   Seen on 08-25-2020 upon a ne referral on 08/25/2020 from [name], NP. Her psychiatrist is Noemi Chapel.on [place], [name]. For Bipolar 1 .   Chief concern according to patient : " What's wrong with me " ???   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   The patient had a sleep study upon referral by Dr. [name] on 11-8 2012, at the time she had endorsed a high Epworth sleepiness score and also a high depression inventory.  Her BMI was 39.5 but she did test for a mild to moderate sleep apnea was titrated to CPAP at that time to a pressure of only 6 cmH2O.  We then encountered the patient again the last time in early 2018 and followed with a baseline polysomnogram again the sleep study was performed on 3-12-19-16 oxygen desaturation nadir 82% time spent  in low oxygen saturation equaled 31 minutes.  EKG normal sinus rhythm.  Normal EKG /EEG /oxygen saturation and no PLM's were  noticed.   She reports discontinuation f all medications including psychiatric meds- ended up at [facility]  In November 2020 she presented to the ED, being confused and now reporting that she was unable to identify her surroundings the people with her she was not sure who she was not sure who she was at the time. She was [facility] in [facility], a psychiatric facility.  She was sleeping all day and could not be aroused.  She was sent home on medication- But d/c these medications by herself.   She is followed by pscyh NP, and in January/ February she has been ongoing struggling with sleepiness. At times too sleepy to function, was under 200 pds, gained weight, can't drive - can't take her kids to school. Only partially responding to Focalin .  The patient reports now again being excessively daytime sleepy condition.      Review of Systems: Out of a complete 14 system review, the patient complains of only the following symptoms, and all other reviewed systems are negative.:  social anxiety disorder, Bipolar 1, cyclic hypersomnia.    How likely are you to doze in the following situations: 0 = not likely, 1 = slight chance, 2 = moderate chance, 3 = high chance   Sitting and Reading? Watching Television? Sitting inactive  in a public place (theater or meeting)? As a passenger in a car for an hour without a break? Lying down in the afternoon when circumstances permit? Sitting and talking to someone? Sitting quietly after lunch without alcohol? In a car, while stopped for a few minutes in traffic?   Total = 11/ 24 points   FSS endorsed at 61/ 63 points.   Social History   Socioeconomic History  . Marital status: Married    Spouse name: Not on file  . Number of children: 1  . Years of education: college  . Highest education level: Not on file  Occupational History  .  Occupation: disabled  Tobacco Use  . Smoking status: Former Smoker    Packs/day: 2.00    Years: 10.00    Pack years: 20.00    Types: Cigarettes    Start date: 17    Quit date: 06/25/2000    Years since quitting: 20.1  . Smokeless tobacco: Never Used  Vaping Use  . Vaping Use: Never used  Substance and Sexual Activity  . Alcohol use: No  . Drug use: No  . Sexual activity: Yes  Other Topics Concern  . Not on file  Social History Narrative   Caffeine - patient is trying to stop drinking caffeine (she drinks 1 "huge" cup of caffeine daily)   Patient is right handed.   Social Determinants of Health   Financial Resource Strain: Not on file  Food Insecurity: Not on file  Transportation Needs: Not on file  Physical Activity: Not on file  Stress: Not on file  Social Connections: Not on file    Family History  Adopted: Yes  Problem Relation Age of Onset  . Depression Mother   . Dementia Mother   . Depression Maternal Grandmother   . Diabetes Father     Past Medical History:  Diagnosis Date  . Anxiety   . Asthma   . Bell's palsy   . Bipolar disorder ([place])   . Cancer ([place])    skin  . Chronic headache disorder 03/08/2015  . Collapsed lung    right lung  . COPD (chronic obstructive pulmonary disease) ([place])   . Depression   . Dislocated shoulder   . Dyslipidemia   . Fatigue   . Fibromyalgia   . GERD (gastroesophageal reflux disease)   . Hypercholesteremia   . Migraine   . Nutcracker esophagus   . Obsessive-compulsive disorder   . OSA (obstructive sleep apnea)   . 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 Sleep apnea   . Thyroid disease     Past Surgical History:  Procedure Laterality Date  . ABDOMINAL HYSTERECTOMY    . ans     2001, 2010  . ANS unit     . CESAREAN SECTION    . CESAREAN SECTION     2007  . DILATION AND CURETTAGE OF UTERUS     2007  . DNC    . ESOPHAGEAL MANOMETRY N/A 04/29/2015   Procedure: ESOPHAGEAL MANOMETRY (EM);  Surgeon: Arta Silence, MD;  Location: [facility];  Service: Endoscopy;  Laterality: N/A;  . gallbladder removed    . GALLBLADDER SURGERY     1998  . SHOULDER SURGERY  1998   plate placed  . stretching of esophagus    . Tens unit     04/2014     Current Outpatient Medications on File Prior to Visit  Medication Sig Dispense Refill  . acetaminophen (TYLENOL) 325 MG  tablet Take 650 mg by mouth every 6 (six) hours as needed for mild pain.     Marland Kitchen acyclovir (ZOVIRAX) 400 MG tablet Take 400 mg by mouth daily as needed (for herpes).   0  . albuterol (PROVENTIL HFA;VENTOLIN HFA) 108 (90 BASE) MCG/ACT inhaler Inhale 1 puff into the lungs every 6 (six) hours as needed for wheezing or shortness of breath.    . cetirizine (ZYRTEC) 10 MG tablet Take 10 mg by mouth daily.   0  . Cholecalciferol (VITAMIN D3) 50 MCG (2000 UT) TABS Take 1 tablet by mouth daily.    . clonazePAM (KLONOPIN) 1 MG tablet Take 1 tablet by mouth 2 (two) times daily as needed.    . cyanocobalamin 1000 MCG tablet     . dexmethylphenidate (FOCALIN) 10 MG tablet Take 10 mg by mouth daily at 12 noon.    Marland Kitchen FLUoxetine HCl 60 MG TABS TAKE 1 TABLET BY MOUTH DAILY IN THE MORNING    . isosorbide mononitrate (IMDUR) 30 MG 24 hr tablet Take 30 mg by mouth daily.    . JORNAY PM 100 MG CP24 Take 1 capsule by mouth at bedtime.    . Multiple Vitamin (MULTIVITAMIN WITH MINERALS) TABS tablet Take 1 tablet by mouth daily.    . Multiple Vitamins-Minerals (HAIR SKIN NAILS PO) Take 1 capsule by mouth daily.    . nitroGLYCERIN (NITROSTAT) 0.4 MG SL tablet Place 0.4 mg under the tongue every 5 (five) minutes as needed for chest pain.    Marland Kitchen QUEtiapine (SEROQUEL) 200 MG tablet Take 200 mg by mouth at bedtime.     No current facility-administered medications on file prior to visit.    Allergies  Allergen Reactions  . Flagyl [Metronidazole] Anaphylaxis and Swelling    Tongue swelling/ confusion  . Dilaudid [Hydromorphone Hcl] Itching  . Ketorolac Tromethamine  Swelling  . Nsaids     Can't take for fibromyalgia   . Salicylates Other (See Comments)    unknown  . Iodinated Diagnostic Agents Other (See Comments) and Rash    unknown  . Prednisone Anxiety and Other (See Comments)    "anxiety"  . Tape Itching, Dermatitis, Rash and Other (See Comments)    Blisters - any type of tape and Band-Aids        Physical exam:  Today's Vitals   08/25/20 1011  BP: 121/80  Pulse: 91  Weight: 214 lb (97.1 kg)  Height: _0  (1.6 m)   Body mass index is 37.91 kg/m.   Wt Readings from Last 3 Encounters:  08/25/20 214 lb (97.1 kg)  02/14/20 205 lb (93 kg)  05/08/19 220 lb 12.8 oz (100.2 kg)     Ht Readings from Last 3 Encounters:  08/25/20 _1  (1.6 m)  02/14/20 _2  (1.6 m)  05/08/19 5' 2.5" (1.588 m)      General: The patient is awake, alert and appears  Difficult to arouse, voice is hoarse. Head: Normocephalic, atraumatic. Neck is supple.  Mallampati 1,  neck circumference: 17  inches . Nasal airflow is patent.  Retrognathia is  seen. Small mouth  Dental status:  No teeth- full dentures.  Cardiovascular:  Regular rate and cardiac rhythm by pulse,  without distended neck veins. Respiratory: Lungs are clear to auscultation.  Skin:  Without evidence of ankle edema, or rash. Trunk: The patient's posture is erect.   Neurologic exam : The patient is not awake and not alert, but oriented to place and time.   Memory  subjective described as impaired   Attention span & concentration ability appears slowed, in mpaired.  Speech is fluent,  with dysphonia .  Mood and affect are regressed,    Cranial nerves: no loss of smell or taste reported  Pupils are equal and briskly reactive to light. Funduscopic exam deferred.  Extraocular movements in vertical and horizontal planes were intact and without nystagmus. No Diplopia. Visual fields by finger perimetry are intact. Hearing was intact to soft voice and finger rubbing.    Facial sensation  intact to fine touch- but reports [facility] followed for trigeminal neuralgia. .  Facial motor strength is not symmetric, Bells palsy right facial droop with forehead involvement.  her tongue and uvula move midline.  Neck ROM : rotation, tilt and flexion extension were normal for age and shoulder shrug was symmetrical.    Motor exam:  Symmetric bulk, tone and ROM.   Normal tone without cog wheeling, symmetric grip strength .   Sensory:  Fine touch, pinprick and vibration were  normal.  Proprioception tested in the upper extremities was normal.   Coordination: Rapid alternating movements in the fingers/hands were of normal speed.  The Finger-to-nose maneuver was intact without evidence of ataxia, dysmetria or tremor.   Gait and station: Patient could rise unassisted from a seated position, walked without assistive device.  Stance is of wider  width/ base and the patient turned with 3 steps.  Toe and heel walk were deferred.  Deep tendon reflexes: in the  upper and lower extremities are symmetric and intact.  Babinski response was deferred.      I reviewed the referral notes, the referral is for hypersomnia which has been a cyclic finding sometimes present for months with Mrs. Heathman. She reports that even minor health problems such as a recent sinus injury infection have left her excessively fatigued.  Her sleep need is easily 12 to 18 hours a day .  I strongly suspect that this is not an organic sleep disorder but I will be happy to rule it out.  This means I will offer her another sleep test and we have discussed an HLA test for narcolepsy.     After spending a total time of  45 minutes face to face and additional time for physical and neurologic examination, review of laboratory studies,  personal review of imaging studies, reports and results of other testing and review of referral information / records as far as provided in visit, I have established the following assessments:  1) Bipolar 1  related cyclic sleep disorder, chronic fatigue, depression. No recent manic episodes.  2) at risk of sleep apnea.     My Plan is to proceed with:  1) attended sleep study to rule out organic sleep disorder.  2) HLA    I would like to thank Noemi Chapel, MD and   Berkley Harvey, [place],  [place] [address] for allowing me to meet with and to take care of this pleasant patient.   In short, [name] is presenting with yet another bout of HYPERSOMNIA, PROLONGED SLEEP TIME, FATIGUE and DEPRESSION.  No sleep attacks, dream intrusion, and no cataplexy reported. Reports isolated sleep paralysis.   I plan to follow up either personally or through our NP within 3 month if either test was indicative of a sleep disorder. .     Electronically signed by: Larey Seat, MD 08/25/2020 10:39 AM  [name]  Board certified by [employer] of Sleep Medicine and Diplomate of the [employer] of Sleep Medicine. Board certified In Neurology through the [place], Fellow of the [employer] of Neurology. Medical Director of [employer].

## 2020-08-30 ENCOUNTER — Encounter: Payer: Self-pay | Admitting: Neurology

## 2020-08-30 ENCOUNTER — Telehealth: Payer: Self-pay | Admitting: Neurology

## 2020-08-30 NOTE — Telephone Encounter (Signed)
The narcolepsy eval lab result can take 5-7 business days before coming back. I will contact the patient once we have the result.

## 2020-08-30 NOTE — Telephone Encounter (Signed)
Pt called wanting to know if her lab work results have come in. Please advise.

## 2020-09-01 LAB — NARCOLEPSY EVALUATION
DQA1*01:02: NEGATIVE
DQB1*06:02: NEGATIVE

## 2020-09-01 NOTE — Progress Notes (Signed)
Negative HLA test for narcolepsy

## 2020-09-05 ENCOUNTER — Encounter: Payer: Self-pay | Admitting: Neurology

## 2020-09-21 ENCOUNTER — Other Ambulatory Visit: Payer: Self-pay

## 2020-09-21 ENCOUNTER — Emergency Department (HOSPITAL_COMMUNITY)
Admission: EM | Admit: 2020-09-21 | Discharge: 2020-09-22 | Disposition: A | Discharge status: Home / Self Care | Payer: Commercial Managed Care - PPO | Attending: Emergency Medicine | Admitting: Emergency Medicine

## 2020-09-21 DIAGNOSIS — Z5321 Procedure and treatment not carried out due to patient leaving prior to being seen by health care provider: Secondary | ICD-10-CM | POA: Diagnosis not present

## 2020-09-21 DIAGNOSIS — R070 Pain in throat: Secondary | ICD-10-CM | POA: Diagnosis present

## 2020-09-21 NOTE — ED Notes (Signed)
Pt LWBS, moving OTF.

## 2020-09-21 NOTE — ED Triage Notes (Signed)
Pt reports getting Covid Booster yesterday morning around 9:30 am. Symptoms of throat irritation and closing began last night.  No oral swelling on assessment.  She is unable to speak

## 2020-10-10 ENCOUNTER — Encounter: Payer: Self-pay | Admitting: Neurology

## 2020-10-10 NOTE — Telephone Encounter (Signed)
[name], can one of the super-users help [place] ? CD

## 2020-10-10 NOTE — Telephone Encounter (Signed)
Pt called stating that she would like to add on her medication list the Lyrica Generic Brand 100mg  capsule one by mouth TID and also would like to add Pravastatin 10mg  once a day. Please advise.

## 2020-10-11 ENCOUNTER — Ambulatory Visit (INDEPENDENT_AMBULATORY_CARE_PROVIDER_SITE_OTHER): Payer: Commercial Managed Care - PPO | Admitting: Neurology

## 2020-10-11 ENCOUNTER — Other Ambulatory Visit: Payer: Self-pay

## 2020-10-11 ENCOUNTER — Other Ambulatory Visit: Payer: Self-pay | Admitting: Neurology

## 2020-10-11 ENCOUNTER — Telehealth: Payer: Self-pay

## 2020-10-11 DIAGNOSIS — G8929 Other chronic pain: Secondary | ICD-10-CM

## 2020-10-11 DIAGNOSIS — M25562 Pain in left knee: Secondary | ICD-10-CM

## 2020-10-11 DIAGNOSIS — G510 Bell's palsy: Secondary | ICD-10-CM

## 2020-10-11 DIAGNOSIS — G4711 Idiopathic hypersomnia with long sleep time: Secondary | ICD-10-CM

## 2020-10-11 DIAGNOSIS — F3175 Bipolar disorder, in partial remission, most recent episode depressed: Secondary | ICD-10-CM

## 2020-10-11 DIAGNOSIS — R5381 Other malaise: Secondary | ICD-10-CM

## 2020-10-11 NOTE — Telephone Encounter (Signed)
Spoke with patient to confirm she would have a female tech. Female tech will not be here for study. She was ok with a female tech. Does not bother her at all.

## 2020-10-13 ENCOUNTER — Encounter: Payer: Self-pay | Admitting: Neurology

## 2020-10-13 DIAGNOSIS — F3175 Bipolar disorder, in partial remission, most recent episode depressed: Secondary | ICD-10-CM | POA: Insufficient documentation

## 2020-10-13 DIAGNOSIS — R5381 Other malaise: Secondary | ICD-10-CM | POA: Insufficient documentation

## 2020-10-13 DIAGNOSIS — G4711 Idiopathic hypersomnia with long sleep time: Secondary | ICD-10-CM | POA: Insufficient documentation

## 2020-10-13 NOTE — Procedures (Signed)
PATIENT'S NAME:  Gloria, Stokes DOB:      [birth year]      MR#:    [id number]     DATE OF RECORDING: 10/11/2020  Rudene Christians REFERRING M.D.:  [name], MD Study Performed:   Expanded montage EEG- Polysomnogram HISTORY:  Chief concern according to patient: " What's wrong with me " ???    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 year old Caucasian female patient who was fast asleep in my exam room and was difficult to arouse. I have last seen her over 3 years-ago.  She woke up after I tried to wake her multiple times reacted startled and somewhat regressed.   Seen on 08-25-2020 upon a referral on 08/25/2020 from [name], NP. Her psychiatrist is Noemi Chapel.  On [place], [facility]. For Bipolar disorder type 1.    The patient endorsed the Epworth Sleepiness Scale at 11 points.  FSS at 61/63 points.  The patient's weight 214 pounds with a height of 63 (inches), resulting in a BMI of 37.9 kg/m2. The patient's neck circumference measured 17 inches.  CURRENT MEDICATIONS: Tylenol, Zovirax, Ventolin, Zyrtec, Vit D, Klonopin, Cyanocobalamin, Focalin, Fluoxetine, Imdur, Janay PM, Multivitamin, Nitrostat, Seroquel   PROCEDURE:  This is a multichannel digital polysomnogram utilizing the Somnostar 11.2 system.  Electrodes and sensors were applied and monitored per AASM Specifications.   EEG, EOG, Chin and Limb EMG, were sampled at 200 Hz.  ECG, Snore and Nasal Pressure, Thermal Airflow, Respiratory Effort,  CPAP Flow and Pressure, Oximetry was sampled at 50 Hz. Digital video and audio were recorded.      BASELINE STUDY: Lights Out was at 20:54 and Lights On at 03:52. Total recording time (TRT) was 418 minutes, with a total sleep time (TST) of 392.5 minutes.   The patient's sleep latency was 17.5 minutes.  REM latency was 103.5 minutes.  The sleep efficiency was 93.9 %.      SLEEP ARCHITECTURE: WASO (Wake after sleep onset) was 5 minutes.  There were 19.5 minutes in Stage N1, 242 minutes Stage N2, 20.5 minutes Stage N3 and 110.5 minutes in Stage REM.  The percentage of Stage N1 was 5.%, Stage N2 was 61.7%, Stage N3 was 5.2% and Stage R (REM sleep) was 28.2%.    RESPIRATORY ANALYSIS:  There were a total of 2 respiratory events:  0 obstructive apneas, 0 central apneas and 0 mixed apneas with a total of 0 apneas and an apnea index (AI) of 0 /hour. There were 2 hypopneas with a hypopnea index of .3 /hour. The patient also had 0 respiratory event related arousals (RERAs).      The total APNEA/HYPOPNEA INDEX (AHI) was .3/hour and the total RESPIRATORY DISTURBANCE INDEX was  .3 /hour.  2 events occurred in REM sleep and 0 events in NREM. The REM AHI was  1.1 /hour, versus a non-REM AHI of 0. The patient spent 30 minutes of total sleep time in the supine position and 363 minutes in non-supine.. The supine AHI was 0.0 versus a non-supine AHI of 0.3.  OXYGEN SATURATION & C02:  The Wake baseline 02 saturation was  91%, with the lowest being 85%. Time spent below 89% saturation equaled 1 minutes. The arousals were noted as: 43 were spontaneous, 0 were associated with PLMs, 1 associated with respiratory events. The patient had a total of 0 Periodic Limb Movements.   Audio and video analysis did not show any abnormal or unusual movements, behaviors but sleep talking in NREM sleep. The patient took no bathroom breaks. EKG was in keeping with normal sinus rhythm (NSR). Post-study, the patient indicated that sleep was the  same as usual.   IMPRESSION:  1. No Obstructive Sleep Apnea (OSA) 2. No Periodic Limb Movement Disorder (PLMD) 3. Normal EKG   RECOMMENDATIONS: No early REM onset, no seizure activity, normal breathing, oxygenation and no abnormal movements in sleep. This was a highly effective sleep- 94% of the recorded time. There is no additional symptom present to suggest narcolepsy/ cataplexy. The level of hypersomnia is likely dependent on degree of depression and type/ dosing of medication.    I certify that I have reviewed the entire raw data recording prior to the issuance of this report in accordance with the Standards of Accreditation of the American Academy of Sleep Medicine (AASM)    Larey Seat, MD Diplomat, American Board of Psychiatry and Neurology  Diplomat, American Board of Sleep Medicine Market researcher, [place] [facility] at [employer]

## 2020-10-13 NOTE — Progress Notes (Signed)
IMPRESSION: IDEOPATHIC HYPERSOMNIA with long sleep time.  1. No Obstructive Sleep Apnea (OSA) 2. No Periodic Limb Movement Disorder (PLMD) 3. Normal EKG   RECOMMENDATIONS: No early REM onset, no seizure activity, normal breathing, oxygenation and no abnormal movements in sleep. This was a highly effective sleep- 94% of the recorded time. There is no additional symptom present to suggest narcolepsy/ cataplexy. The level of hypersomnia is likely dependent on degree of depression and type/ dosing of medication.  NO follow up with sleep clinic is needed.

## 2020-10-17 ENCOUNTER — Other Ambulatory Visit: Payer: Self-pay

## 2020-10-17 ENCOUNTER — Encounter (HOSPITAL_COMMUNITY): Payer: Self-pay | Admitting: Emergency Medicine

## 2020-10-17 ENCOUNTER — Emergency Department (HOSPITAL_COMMUNITY)
Admission: EM | Admit: 2020-10-17 | Discharge: 2020-10-18 | Disposition: A | Discharge status: Home / Self Care | Payer: Commercial Managed Care - PPO | Attending: Emergency Medicine | Admitting: Emergency Medicine

## 2020-10-17 ENCOUNTER — Emergency Department (HOSPITAL_COMMUNITY): Payer: Commercial Managed Care - PPO

## 2020-10-17 DIAGNOSIS — Z85828 Personal history of other malignant neoplasm of skin: Secondary | ICD-10-CM | POA: Insufficient documentation

## 2020-10-17 DIAGNOSIS — Z8616 Personal history of COVID-19: Secondary | ICD-10-CM | POA: Diagnosis not present

## 2020-10-17 DIAGNOSIS — Z87891 Personal history of nicotine dependence: Secondary | ICD-10-CM | POA: Diagnosis not present

## 2020-10-17 DIAGNOSIS — R531 Weakness: Secondary | ICD-10-CM | POA: Insufficient documentation

## 2020-10-17 DIAGNOSIS — J449 Chronic obstructive pulmonary disease, unspecified: Secondary | ICD-10-CM | POA: Insufficient documentation

## 2020-10-17 DIAGNOSIS — J45909 Unspecified asthma, uncomplicated: Secondary | ICD-10-CM | POA: Insufficient documentation

## 2020-10-17 DIAGNOSIS — Z79899 Other long term (current) drug therapy: Secondary | ICD-10-CM | POA: Diagnosis not present

## 2020-10-17 DIAGNOSIS — G4710 Hypersomnia, unspecified: Secondary | ICD-10-CM | POA: Diagnosis not present

## 2020-10-17 DIAGNOSIS — R0602 Shortness of breath: Secondary | ICD-10-CM | POA: Diagnosis not present

## 2020-10-17 DIAGNOSIS — N179 Acute kidney failure, unspecified: Secondary | ICD-10-CM | POA: Diagnosis not present

## 2020-10-17 LAB — URINALYSIS, ROUTINE W REFLEX MICROSCOPIC
Bilirubin Urine: NEGATIVE
Glucose, UA: NEGATIVE mg/dL
Hgb urine dipstick: NEGATIVE
Ketones, ur: NEGATIVE mg/dL
Leukocytes,Ua: NEGATIVE
Nitrite: NEGATIVE
Protein, ur: NEGATIVE mg/dL
Specific Gravity, Urine: 1.02 (ref 1.005–1.030)
pH: 5 (ref 5.0–8.0)

## 2020-10-17 LAB — CBC
HCT: 42.1 % (ref 36.0–46.0)
Hemoglobin: 13.7 g/dL (ref 12.0–15.0)
MCH: 27.7 pg (ref 26.0–34.0)
MCHC: 32.5 g/dL (ref 30.0–36.0)
MCV: 85.1 fL (ref 80.0–100.0)
Platelets: 268 10*3/uL (ref 150–400)
RBC: 4.95 MIL/uL (ref 3.87–5.11)
RDW: 14 % (ref 11.5–15.5)
WBC: 9.1 10*3/uL (ref 4.0–10.5)
nRBC: 0 % (ref 0.0–0.2)

## 2020-10-17 LAB — BASIC METABOLIC PANEL
Anion gap: 10 (ref 5–15)
BUN: 10 mg/dL (ref 6–20)
CO2: 25 mmol/L (ref 22–32)
Calcium: 9 mg/dL (ref 8.9–10.3)
Chloride: 102 mmol/L (ref 98–111)
Creatinine, Ser: 0.62 mg/dL (ref 0.44–1.00)
GFR, Estimated: >60 mL/min (ref >60)
Glucose, Bld: 114 mg/dL — ABNORMAL HIGH (ref 70–99)
Potassium: 3.7 mmol/L (ref 3.5–5.1)
Sodium: 137 mmol/L (ref 135–145)

## 2020-10-17 LAB — I-STAT BETA HCG BLOOD, ED (MC, WL, AP ONLY): I-stat hCG, quantitative: <5 m[IU]/mL (ref <5)

## 2020-10-17 NOTE — ED Notes (Signed)
Unable to obtain EKG due to stimulator pt has for nerve pain. Unable to cut same off at this time.

## 2020-10-17 NOTE — ED Triage Notes (Signed)
Pt arrives via EMS with complaints of SOB- talking in complete sentences. EMS reports no distress, lungs clear. Neuro intact. EMS reports while they were en route, pt would seem to have near syncopal episode- pt would sigh and put her head to the side and not answer, but squeeze her eyes shut. Pt would wake up and be frightened and ask "where am I?"  EMS states episodes did not appear to be medically related. BP 158/90, HR 84 sinus rhythm, 97% on room air. CBG 119

## 2020-10-18 NOTE — ED Provider Notes (Signed)
[facility] EMERGENCY DEPARTMENT Provider Note   CSN: [id number] Arrival date & time: 10/17/20  1417     History Chief Complaint  Patient presents with  . Shortness of Breath    [name] is a 50 y.o. female.  The history is provided by the patient.  Shortness of Breath Severity:  Moderate Onset quality:  Gradual Duration:  1 day Timing:  Constant Progression:  Unchanged Chronicity:  New Relieved by:  Nothing Worsened by:  Nothing Ineffective treatments:  Inhaler Associated symptoms: no abdominal pain, no cough, no fever and no hemoptysis   Associated symptoms comment:  Chest tightness   Patient presents for 2 complaints.  Patient reports ongoing fatigue for several weeks since having COVID-19.  This is been getting evaluated as an outpatient.  Patient also reports shortness of breath over the past day.  She reports mild chest tightness.  No fevers or cough.  She tried using an albuterol inhaler at home without much improvement Denies history of CAD/VTE.    Past Medical History:  Diagnosis Date  . Anxiety   . Asthma   . Bell's palsy   . Bipolar disorder ([place])   . Cancer ([place])    skin  . Chronic headache disorder 03/08/2015  . Collapsed lung    right lung  . COPD (chronic obstructive pulmonary disease) ([place])   . Depression   . Dislocated shoulder   . Dyslipidemia   . Fatigue   . Fibromyalgia   . GERD (gastroesophageal reflux disease)   . Hypercholesteremia   . Migraine   . Nutcracker esophagus   . Obsessive-compulsive disorder   . OSA (obstructive sleep apnea)   . 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 Sleep apnea   . Thyroid disease     Patient Active Problem List   Diagnosis Date Noted  . Hypersomnia with long sleep time, idiopathic 10/13/2020  . Bipolar 1 disorder, depressed, partial remission ([place]) 10/13/2020  . Chronic fatigue and malaise 10/13/2020  . Myofascial muscle pain 11/11/2018  . Chronic bilateral low back pain  with bilateral sciatica 06/30/2018  . Chronic pain of both knees 05/08/2018  . Chronic ankle pain 05/08/2018  . Dyspnea 08/31/2016  . AKI (acute kidney injury) ([place]) 08/31/2016  . Chest pain 08/31/2016  . Chronic headache disorder 03/08/2015  . Chest pain syndrome 11/18/2014  . Dyslipidemia 11/18/2014  . Sleep apnea with use of continuous positive airway pressure (CPAP) 09/03/2013  . 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 Contusion of face, scalp, and neck except eye(s) 11/08/2012  . Mechanical complication of nervous system device, implant, and graft 11/08/2012  . Obstructive sleep apnea 11/08/2012  . Fibromyalgia 12/25/2011  . Depression, major 12/25/2011  . Bell's palsy 12/25/2011  . Chronic pain 12/25/2011  . GERD (gastroesophageal reflux disease) 12/25/2011    Past Surgical History:  Procedure Laterality Date  . ABDOMINAL HYSTERECTOMY    . ans     2001, 2010  . ANS unit     . CESAREAN SECTION    . CESAREAN SECTION     2007  . DILATION AND CURETTAGE OF UTERUS     2007  . DNC    . ESOPHAGEAL MANOMETRY N/A 04/29/2015   Procedure: ESOPHAGEAL MANOMETRY (EM);  Surgeon: Arta Silence, MD;  Location: [facility];  Service: Endoscopy;  Laterality: N/A;  . gallbladder removed    . GALLBLADDER SURGERY     1998  . SHOULDER SURGERY  1998   plate placed  .  stretching of esophagus    . Tens unit     04/2014     OB History   No obstetric history on file.     Family History  Adopted: Yes  Problem Relation Age of Onset  . Depression Mother   . Dementia Mother   . Depression Maternal Grandmother   . Diabetes Father     Social History   Tobacco Use  . Smoking status: Former Smoker    Packs/day: 2.00    Years: 10.00    Pack years: 20.00    Types: Cigarettes    Start date: 10    Quit date: 06/25/2000    Years since quitting: 20.3  . Smokeless tobacco: Never Used  Vaping Use  . Vaping Use: Never used  Substance Use Topics  . Alcohol use: No  . Drug use:  No    Home Medications Prior to Admission medications   Medication Sig Start Date End Date Taking? Authorizing Provider  acetaminophen (TYLENOL) 325 MG tablet Take 650 mg by mouth every 6 (six) hours as needed for mild pain.     [provider]  acyclovir (ZOVIRAX) 400 MG tablet Take 400 mg by mouth daily as needed (for herpes).  06/27/18   [provider]  albuterol (PROVENTIL HFA;VENTOLIN HFA) 108 (90 BASE) MCG/ACT inhaler Inhale 1 puff into the lungs every 6 (six) hours as needed for wheezing or shortness of breath.    [provider]  cetirizine (ZYRTEC) 10 MG tablet Take 10 mg by mouth daily.  06/27/18   [provider]  Cholecalciferol (VITAMIN D3) 50 MCG (2000 UT) TABS Take 1 tablet by mouth daily.    [provider]  clonazePAM (KLONOPIN) 1 MG tablet Take 1 tablet by mouth 2 (two) times daily as needed. 11/03/19   [provider]  cyanocobalamin 1000 MCG tablet  04/04/20   [provider]  dexmethylphenidate (FOCALIN) 10 MG tablet Take 10 mg by mouth daily at 12 noon.    [provider]  FLUoxetine HCl 60 MG TABS TAKE 1 TABLET BY MOUTH DAILY IN THE MORNING 08/23/20   [provider]  isosorbide mononitrate (IMDUR) 30 MG 24 hr tablet Take 30 mg by mouth daily. 07/07/19   [provider]  JORNAY PM 100 MG CP24 Take 1 capsule by mouth at bedtime. 08/23/20   [provider]  Multiple Vitamin (MULTIVITAMIN WITH MINERALS) TABS tablet Take 1 tablet by mouth daily.    [provider]  Multiple Vitamins-Minerals (HAIR SKIN NAILS PO) Take 1 capsule by mouth daily.    [provider]  nitroGLYCERIN (NITROSTAT) 0.4 MG SL tablet Place 0.4 mg under the tongue every 5 (five) minutes as needed for chest pain.    [provider]  pravastatin (PRAVACHOL) 10 MG tablet Take 10 mg by mouth daily. 09/20/20   [provider]  pregabalin (LYRICA) 100 MG capsule Take 100 mg by mouth in  the morning, at noon, and at bedtime. 09/19/20   [provider]  QUEtiapine (SEROQUEL) 200 MG tablet Take 200 mg by mouth at bedtime.    [provider]    Allergies    Flagyl [metronidazole], Dilaudid [hydromorphone hcl], Ketorolac tromethamine, Nsaids, Salicylates, Iodinated diagnostic agents, Prednisone, and Tape  Review of Systems   Review of Systems  Constitutional: Positive for fatigue. Negative for fever.  Respiratory: Positive for shortness of breath. Negative for cough and hemoptysis.   Cardiovascular: Negative for leg swelling.  Gastrointestinal: Negative  for abdominal pain.  All other systems reviewed and are negative.   Physical Exam Updated Vital Signs BP 126/76   Pulse 82   Temp 98.6 F (37 C)   Resp 18   SpO2 98%   Physical Exam CONSTITUTIONAL: Well developed/well nourished HEAD: Normocephalic/atraumatic EYES: EOMI/PERRL NECK: supple no meningeal signs SPINE/BACK:entire spine nontender CV: S1/S2 noted, no murmurs/rubs/gallops noted LUNGS: Lungs are clear to auscultation bilaterally, no apparent distress ABDOMEN: soft, nontender, no rebound or guarding, bowel sounds noted throughout abdomen GU:no cva tenderness NEURO: Pt is awake/alert/appropriate, moves all extremitiesx4.  No facial droop.   EXTREMITIES: pulses normal/equal, full ROM, no lower extremity edema or tenderness SKIN: warm, color normal PSYCH: Flat affect ED Results / Procedures / Treatments   Labs (all labs ordered are listed, but only abnormal results are displayed) Labs Reviewed  BASIC METABOLIC PANEL - Abnormal; Notable for the following components:      Result Value   Glucose, Bld 114 (*)    All other components within normal limits  URINALYSIS, ROUTINE W REFLEX MICROSCOPIC - Abnormal; Notable for the following components:   APPearance HAZY (*)    All other components within normal limits  CBC  I-STAT BETA HCG BLOOD, ED ([facility], [facility], [facility] ONLY)    EKG EKG  Interpretation  Date/Time:  Tuesday October 18 2020 03:11:05 EST Ventricular Rate:  77 PR Interval:    QRS Duration: 94 QT Interval:  403 QTC Calculation: 457 R Axis:   67 Text Interpretation: Sinus rhythm Confirmed by Ripley Fraise [phone]) on 10/18/2020 3:15:23 AM   Radiology DG Chest 2 View  Result Date: 10/17/2020 CLINICAL DATA:  Shortness of breath and chest tightness.  Lethargy. EXAM: CHEST - 2 VIEW COMPARISON:  Most recent chest radiograph 04/28/2020 FINDINGS: Lung volumes are low. Upper normal heart size likely accentuated by technique and low lung volumes. Bronchovascular crowding with mild central bronchial thickening. No focal airspace disease. No pleural fluid or pneumothorax. No acute osseous abnormalities are seen. Stimulator leads course posteriorly in the subcutaneous tissues from the upper lower field of view, tips not included. IMPRESSION: Low lung volumes with bronchovascular crowding and central bronchial thickening. Electronically Signed   By: Keith Rake M.D.   On: 10/17/2020 15:30    Procedures Procedures   Medications Ordered in ED Medications - No data to display  ED Course  I have reviewed the triage vital signs and the nursing notes.  Pertinent labs & imaging results that were available during my care of the patient were reviewed by me and considered in my medical decision making (see chart for details).    MDM Rules/Calculators/A&P                          Patient presents after feeling short of breath over the past day.  Patient is in no acute distress, lung sounds are clear, chest x-ray is negative.  No evidence of pneumonia or edema.  Patient recently recovered from COVID-19.  No pleuritic pain to suggest acute PE.  There is no hypoxia.  Patient also reports hypersomnia and fatigue.  Patient has already had extensive evaluation including a sleep study that ruled out sleep apnea, was likely related to depression and medications per neurology.   Patient appropriate for discharge home Final Clinical Impression(s) / ED Diagnoses Final diagnoses:  SOB (shortness of breath)  Hypersomnia    Rx / DC Orders ED Discharge Orders    None  Ripley Fraise, MD 10/18/20 [phone]

## 2020-10-18 NOTE — ED Notes (Signed)
E-signature pad unavailable at time of pt discharge. This RN discussed discharge materials with pt and answered all pt questions. Pt stated understanding of discharge material. ? ?

## 2020-10-31 ENCOUNTER — Telehealth: Payer: Self-pay | Admitting: *Deleted

## 2020-10-31 NOTE — Telephone Encounter (Signed)
I called pt left voice mail. 

## 2020-11-11 ENCOUNTER — Other Ambulatory Visit: Payer: Self-pay

## 2020-11-11 ENCOUNTER — Ambulatory Visit (HOSPITAL_COMMUNITY)
Admission: EM | Admit: 2020-11-11 | Discharge: 2020-11-11 | Discharge status: Against Medical Advice | Payer: Medicare Other

## 2021-11-29 DIAGNOSIS — K7581 Nonalcoholic steatohepatitis (NASH): Secondary | ICD-10-CM | POA: Insufficient documentation

## 2021-11-29 DIAGNOSIS — M249 Joint derangement, unspecified: Secondary | ICD-10-CM | POA: Insufficient documentation

## 2021-12-06 DIAGNOSIS — E119 Type 2 diabetes mellitus without complications: Secondary | ICD-10-CM | POA: Insufficient documentation

## 2022-01-04 ENCOUNTER — Encounter: Payer: Self-pay | Admitting: Dietician

## 2022-01-04 ENCOUNTER — Encounter: Payer: Commercial Managed Care - PPO | Attending: Nurse Practitioner | Admitting: Dietician

## 2022-01-04 DIAGNOSIS — Z713 Dietary counseling and surveillance: Secondary | ICD-10-CM | POA: Insufficient documentation

## 2022-01-04 DIAGNOSIS — Z6837 Body mass index (BMI) 37.0-37.9, adult: Secondary | ICD-10-CM | POA: Diagnosis not present

## 2022-01-04 DIAGNOSIS — Z7984 Long term (current) use of oral hypoglycemic drugs: Secondary | ICD-10-CM | POA: Insufficient documentation

## 2022-01-04 DIAGNOSIS — E119 Type 2 diabetes mellitus without complications: Secondary | ICD-10-CM | POA: Insufficient documentation

## 2022-01-04 NOTE — Progress Notes (Signed)
Patient was seen on 01/04/2022 for the first of a series of three diabetes self-management courses at [facility]. ? ?Patient Education Plan per assessed needs and concerns is to attend three course education program for Diabetes Self Management Education. ? ?A1C was 6.6% on 11/22/2021. ? ?The following learning objectives were met by the patient during this class: ?Describe diabetes, types of diabetes and pathophysiology ?State some common risk factors for diabetes ?Defines the role of glucose and insulin ?Describe the relationship between diabetes and cardiovascular and other risks ?State the members of the Healthcare Team ?States the rationale for glucose monitoring and when to test ?State their individual Target Range ?State the importance of logging glucose readings and how to interpret the readings ?Identifies A1C target ?Explain the correlation between A1c and eAG values ?State symptoms and treatment of high blood glucose and low blood glucose ?Explain proper technique for glucose testing and identify proper sharps disposal ? ?Handouts given during class include: ?How to Thrive:  A Guide for Your Journey with Diabetes by the ADA ?Meal Plan Card and carbohydrate content list ?Dietary intake form ?Low Sodium Flavoring Tips ?Types of Fats ?Dining Out ?Label reading ?Snack list ?The diabetes portion plate ?Diabetes Resources ?A1c to eAG Conversion Chart ?Blood Glucose Log ?Diabetes Recommended Care Schedule ?Support Group ?Diabetes Success Plan ?Core Class Satisfaction Survey ? ? ?Follow-Up Plan: ?Attend core 2  ? ?

## 2022-01-09 ENCOUNTER — Telehealth: Payer: Self-pay | Admitting: Podiatry

## 2022-01-09 ENCOUNTER — Ambulatory Visit (INDEPENDENT_AMBULATORY_CARE_PROVIDER_SITE_OTHER): Payer: Commercial Managed Care - PPO

## 2022-01-09 ENCOUNTER — Ambulatory Visit (INDEPENDENT_AMBULATORY_CARE_PROVIDER_SITE_OTHER): Payer: Commercial Managed Care - PPO | Admitting: Podiatry

## 2022-01-09 DIAGNOSIS — M7730 Calcaneal spur, unspecified foot: Secondary | ICD-10-CM | POA: Diagnosis not present

## 2022-01-09 DIAGNOSIS — L089 Local infection of the skin and subcutaneous tissue, unspecified: Secondary | ICD-10-CM | POA: Diagnosis not present

## 2022-01-09 DIAGNOSIS — M7731 Calcaneal spur, right foot: Secondary | ICD-10-CM

## 2022-01-09 DIAGNOSIS — M7732 Calcaneal spur, left foot: Secondary | ICD-10-CM

## 2022-01-09 DIAGNOSIS — S90829A Blister (nonthermal), unspecified foot, initial encounter: Secondary | ICD-10-CM

## 2022-01-09 NOTE — Progress Notes (Signed)
?Subjective:  ?Patient ID: [name], female    DOB: [birth year],  MRN: [id number] ?HPI ?Chief Complaint  ?Patient presents with  ? Blister  ?  Bilat foot- behind heel pt reports she developed blister after using certain insoles.   ? ? ?51 y.o. female presents with the above complaint.  ? ?ROS: Denies fever chills nausea vomit muscle aches pains calf pain back pain chest pain shortness of breath. ? ?She states that the blisters come up she thinks because her blood sugars went up from eating mashed potatoes also she feels that it may have been the inserts in her shoes the posterior heels up and rub. ? ?Past Medical History:  ?Diagnosis Date  ? Anxiety   ? Asthma   ? Bell's palsy   ? Bipolar disorder ([place])   ? Cancer [facility])   ? skin  ? Chronic headache disorder 03/08/2015  ? Collapsed lung   ? right lung  ? COPD (chronic obstructive pulmonary disease) ([place])   ? Depression   ? Dislocated shoulder   ? Dyslipidemia   ? Fatigue   ? Fibromyalgia   ? GERD (gastroesophageal reflux disease)   ? Hypercholesteremia   ? Migraine   ? Nutcracker esophagus   ? Obsessive-compulsive disorder   ? OSA (obstructive sleep apnea)   ? 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 Sleep apnea   ? Thyroid disease   ? ?Past Surgical History:  ?Procedure Laterality Date  ? ABDOMINAL HYSTERECTOMY    ? ans    ? 2001, 2010  ? ANS unit     ? CESAREAN SECTION    ? CESAREAN SECTION    ? 2007  ? DILATION AND CURETTAGE OF UTERUS    ? 2007  ? DNC    ? ESOPHAGEAL MANOMETRY N/A 04/29/2015  ? Procedure: ESOPHAGEAL MANOMETRY (EM);  Surgeon: Arta Silence, MD;  Location: [facility];  Service: Endoscopy;  Laterality: N/A;  ? gallbladder removed    ? GALLBLADDER SURGERY    ? 1998  ? SHOULDER SURGERY  1998  ? plate placed  ? stretching of esophagus    ? Tens unit    ? 04/2014  ? ? ?Current Outpatient Medications:  ?  acetaminophen (TYLENOL) 325 MG tablet, Take 650 mg by mouth every 6 (six) hours as needed for mild pain. , Disp: , Rfl:  ?  acyclovir (ZOVIRAX) 400 MG  tablet, Take 400 mg by mouth daily as needed (for herpes). , Disp: , Rfl: 0 ?  albuterol (PROVENTIL HFA;VENTOLIN HFA) 108 (90 BASE) MCG/ACT inhaler, Inhale 1 puff into the lungs every 6 (six) hours as needed for wheezing or shortness of breath., Disp: , Rfl:  ?  cetirizine (ZYRTEC) 10 MG tablet, Take 10 mg by mouth daily. , Disp: , Rfl: 0 ?  Cholecalciferol (VITAMIN D3) 50 MCG (2000 UT) TABS, Take 1 tablet by mouth daily., Disp: , Rfl:  ?  clonazePAM (KLONOPIN) 1 MG tablet, Take 1 tablet by mouth 2 (two) times daily as needed., Disp: , Rfl:  ?  cyanocobalamin 1000 MCG tablet, , Disp: , Rfl:  ?  dexmethylphenidate (FOCALIN) 10 MG tablet, Take 10 mg by mouth daily at 12 noon., Disp: , Rfl:  ?  FLUoxetine HCl 60 MG TABS, TAKE 1 TABLET BY MOUTH DAILY IN THE MORNING, Disp: , Rfl:  ?  isosorbide mononitrate (IMDUR) 30 MG 24 hr tablet, Take 30 mg by mouth daily., Disp: , Rfl:  ?  JORNAY PM 100 MG CP24,  Take 1 capsule by mouth at bedtime., Disp: , Rfl:  ?  Multiple Vitamin (MULTIVITAMIN WITH MINERALS) TABS tablet, Take 1 tablet by mouth daily., Disp: , Rfl:  ?  Multiple Vitamins-Minerals (HAIR SKIN NAILS PO), Take 1 capsule by mouth daily., Disp: , Rfl:  ?  nitroGLYCERIN (NITROSTAT) 0.4 MG SL tablet, Place 0.4 mg under the tongue every 5 (five) minutes as needed for chest pain., Disp: , Rfl:  ?  pravastatin (PRAVACHOL) 10 MG tablet, Take 10 mg by mouth daily., Disp: , Rfl:  ?  pregabalin (LYRICA) 100 MG capsule, Take 100 mg by mouth in the morning, at noon, and at bedtime., Disp: , Rfl:  ?  QUEtiapine (SEROQUEL) 200 MG tablet, Take 200 mg by mouth at bedtime., Disp: , Rfl:  ? ?Allergies  ?Allergen Reactions  ? Flagyl [Metronidazole] Anaphylaxis and Swelling  ?  Tongue swelling/ confusion  ? Dilaudid [Hydromorphone Hcl] Itching  ? Ketorolac Tromethamine Swelling  ? Nsaids   ?  Can't take for fibromyalgia ?  ? Salicylates Other (See Comments)  ?  unknown  ? Iodinated Contrast Media Other (See Comments) and Rash  ?  unknown  ?  Prednisone Anxiety and Other (See Comments)  ?  "anxiety"  ? Tape Itching, Dermatitis, Rash and Other (See Comments)  ?  Blisters - any type of tape and Band-Aids  ? ? ?  ? ?Review of Systems ?Objective:  ?There were no vitals filed for this visit. ? ?General: Well developed, nourished, in no acute distress, alert and oriented x3  ? ?Dermatological: Skin is warm, dry and supple bilateral. Nails x 10 are well maintained; remaining integument appears unremarkable at this time. There are no open sores, no preulcerative lesions, no rash or signs of infection present.  Small transverse blisters no blood serum only no signs of infection no erythema.  Lanced them only clear fluid came out. ? ?Vascular: Dorsalis Pedis artery and Posterior Tibial artery pedal pulses are 2/4 bilateral with immedate capillary fill time. Pedal hair growth present. No varicosities and no lower extremity edema present bilateral.  ? ?Neruologic: Grossly intact via light touch bilateral. Vibratory intact via tuning fork bilateral. Protective threshold with Semmes Wienstein monofilament intact to all pedal sites bilateral. Patellar and Achilles deep tendon reflexes 2+ bilateral. No Babinski or clonus noted bilateral.  ? ?Musculoskeletal: No gross boney pedal deformities bilateral. No pain, crepitus, or limitation noted with foot and ankle range of motion bilateral. Muscular strength 5/5 in all groups tested bilateral. ? ?Gait: Unassisted, Nonantalgic.  ? ? ?Radiographs: ? ?None taken ? ?Assessment & Plan:  ? ?Assessment: Very small superficial blisters no infection ? ?Plan: Lanced the blister today cleaned it thoroughly with Betadine placed a small amount of antibiotic ointment and a Band-Aid bilateral heels. ? ? ? ? ?Teller [place] T. [place], DPM ?

## 2022-01-09 NOTE — Telephone Encounter (Signed)
Pt was seen today and was treated for heel blisters. She would like to know if she is able to wear socks and sneakers. Please advise ?

## 2022-01-11 ENCOUNTER — Ambulatory Visit: Payer: Commercial Managed Care - PPO | Admitting: Dietician

## 2022-01-11 ENCOUNTER — Ambulatory Visit: Payer: Commercial Managed Care - PPO

## 2022-01-11 DIAGNOSIS — M7730 Calcaneal spur, unspecified foot: Secondary | ICD-10-CM

## 2022-01-11 NOTE — Progress Notes (Signed)
SITUATION ?Patient Name:  [name] ?MRN:   [id number] ?Reason for Visit: Evaluation for diabetic shoes and insoles ? ?Patient Report: ?Chief Complaint: Patient would like to obtain pre-certification from their insurance regarding coverage and an out of pocket estimate before proceeding. ? ?OBJECTIVE DATA ?Patient History / Diagnosis:   ?  ICD-10-CM   ?1. Calcaneal spur, unspecified laterality  M77.30   ?  ? ? ?Physician Recommended Device(s): Diabetic shoes and insoles ? ?Laterality HCPCS Code  ?bilateral S9373, [id number]  ? ?ACTIONS PERFORMED ?Patient was seen for evaluation for diabetic shoes and insoles. Financial responsibility was discussed, and patient requested a check of benefits and an estimation of their out of pocket cost for the equipment.  ? ?PLAN ?patient is to be contacted once insurance pre-certification and out of pocket cost estimate is obtained so that they make an informed decision regarding plan of care. In the event patient elects not to pursue orthotic treatment, referring physician is to be contacted in order to update plan of care as needed. All questions were answered and concerns addressed. ? ? ? ? ? ?

## 2022-01-16 ENCOUNTER — Ambulatory Visit (INDEPENDENT_AMBULATORY_CARE_PROVIDER_SITE_OTHER): Payer: Commercial Managed Care - PPO | Admitting: Podiatry

## 2022-01-16 DIAGNOSIS — S90821A Blister (nonthermal), right foot, initial encounter: Secondary | ICD-10-CM

## 2022-01-18 ENCOUNTER — Ambulatory Visit: Payer: Commercial Managed Care - PPO | Admitting: Dietician

## 2022-01-25 ENCOUNTER — Encounter: Payer: Self-pay | Admitting: Podiatry

## 2022-01-25 DIAGNOSIS — G43909 Migraine, unspecified, not intractable, without status migrainosus: Secondary | ICD-10-CM | POA: Insufficient documentation

## 2022-01-25 DIAGNOSIS — G43109 Migraine with aura, not intractable, without status migrainosus: Secondary | ICD-10-CM | POA: Insufficient documentation

## 2022-01-25 NOTE — Progress Notes (Signed)
?  Subjective:  ?Patient ID: [name], female    DOB: 05-25-1971,  MRN: [id number] ? ?LUGENIA ASSEFA presents to clinic today for  evaluation of right foot. She relates h/o diabetes. She is concerned about a blister on the plantar mid-arch area of her right foot which occurred on Saturday, most likely from a pair of shoes. Pain was 6/10 and there was minor swelling. She is very concerned because she is diabetic. Patient is currently on day 3 of 10 day course of Doxycycline.  She has been keeping it clean and applying Silvadene Cream. She recently saw Dr. Milinda Pointer for blisters of heels most likely from inserts she was wearing. These have resolved. ? ? ? ? ? ?Patient states blood glucose was 98 mg/dl today.  Last known HgA1c was 6.6%. ? ?PCP is Lin Landsman, MD , and last visit was December 18, 2021. ? ?Allergies  ?Allergen Reactions  ? Famotidine   ?  Other reaction(s): Mental Status Changes (intolerance), Other (See Comments) ?Altered mentation ?  ? Flagyl [Metronidazole] Anaphylaxis and Swelling  ?  Tongue swelling/ confusion  ? Lactose Diarrhea  ?  Other reaction(s): Other (See Comments)  ? Dilaudid [Hydromorphone Hcl] Itching  ? Hydromorphone   ? Ketorolac Tromethamine Swelling  ? Nsaids   ?  Can't take for fibromyalgia ?  ? Salicylates Other (See Comments)  ?  unknown  ? Valproic Acid   ?  Other reaction(s): Other (See Comments) ?Hair loss  ? Hydromorphone Hcl Itching  ?  Needs to have Benedryl  ? Iodinated Contrast Media Other (See Comments) and Rash  ?  unknown  ? Prednisone Anxiety and Other (See Comments)  ?  "anxiety"  ? Tape Itching, Dermatitis, Rash and Other (See Comments)  ?  Blisters - any type of tape and Band-Aids  ? ? ?  ? ? ?Review of Systems: Negative except as noted in the HPI. ? ?Objective: No changes noted in today's physical examination. ? ?Vascular Examination: ?Palpable pedal pulses b/l LE. Digital hair present b/l. No pedal edema b/l. Skin temperature gradient WNL b/l. No varicosities b/l.  . ? ?Dermatological Examination: ?Pedal skin with normal turgor, texture and tone b/l. No open wounds. No interdigital macerations b/l. Toenails 1-5 b/l adequate length and well maintained. There is evidence of blister devoid of fluid medial mid-arch. Roof remains intact. There is no surrounding erythema, no edema, no fluctuance and no pain on palpation. ? ?Neurological Examination: ?Protective sensation intact with 10 gram monofilament b/l LE. Vibratory sensation intact b/l LE.  ? ?Musculoskeletal Examination: ?Muscle strength 5/5 to all LE muscle groups b/l. No gross bony deformities bilaterally. Patient ambulates independent of any assistive aids. ? ?Assessment/Plan: ?1. Blister of plantar aspect of foot, right, initial encounter   ?  ? ?-Patient was evaluated and treated. All patient's and/or POA's questions/concerns answered on today's visit. ?-I cleaned the area and applied Silvadene Cream. She is to continue applying antibiotic cream once daily. Patient reassured she does not have an infection. Continue prescribed antibiotics until all are gone. Call office if she has any concerns. ?-Patient to continue soft, supportive shoe gear daily. ?-Patient/POA to call should there be question/concern in the interim.  ? ?Return if symptoms worsen or fail to improve. ? ?Marzetta Board, DPM  ?

## 2022-01-30 ENCOUNTER — Encounter: Payer: Self-pay | Admitting: Dietician

## 2022-01-30 ENCOUNTER — Encounter: Payer: Commercial Managed Care - PPO | Attending: Nurse Practitioner | Admitting: Dietician

## 2022-01-30 DIAGNOSIS — Z713 Dietary counseling and surveillance: Secondary | ICD-10-CM | POA: Insufficient documentation

## 2022-01-30 DIAGNOSIS — E119 Type 2 diabetes mellitus without complications: Secondary | ICD-10-CM | POA: Insufficient documentation

## 2022-01-30 NOTE — Progress Notes (Signed)
Patient was seen on 01/30/2022 for the second of a series of three diabetes self-management courses at the [facility]. The following learning objectives were met by the patient during this class: ? ?Describe the role of different macronutrients on glucose ?Explain how carbohydrates affect blood glucose ?State what foods contain the most carbohydrates ?Demonstrate carbohydrate counting ?Demonstrate how to read Nutrition Facts food label ?Describe effects of various fats on heart health ?Describe the importance of good nutrition for health and healthy eating strategies ?Describe techniques for managing your shopping, cooking and meal planning ?List strategies to follow meal plan when dining out ?Describe the effects of alcohol on glucose and how to use it safely ? ?Goals:  ?Follow Diabetes Meal Plan as instructed  ?Aim to spread carbs evenly throughout the day  ?Aim for 3 meals per day and snacks as needed ?Include lean protein foods to meals/snacks  ?Monitor glucose levels as instructed by your doctor  ? ?Follow-Up Plan: ?Attend Core 3 ?Work towards following Herbalist.  ?

## 2022-02-06 ENCOUNTER — Encounter: Payer: Commercial Managed Care - PPO | Admitting: Dietician

## 2022-02-06 ENCOUNTER — Encounter: Payer: Self-pay | Admitting: Dietician

## 2022-02-06 DIAGNOSIS — E119 Type 2 diabetes mellitus without complications: Secondary | ICD-10-CM

## 2022-02-06 DIAGNOSIS — Z713 Dietary counseling and surveillance: Secondary | ICD-10-CM | POA: Diagnosis not present

## 2022-02-06 NOTE — Progress Notes (Signed)
Patient was seen on 02/06/2022 for the third of a series of three diabetes self-management courses at the [facility].   State the amount of activity recommended for healthy living Describe activities suitable for individual needs Identify ways to regularly incorporate activity into daily life Identify barriers to activity and ways to over come these barriers Identify diabetes medications being personally used and their primary action for lowering glucose and possible side effects Describe role of stress on blood glucose and develop strategies to address psychosocial issues Identify diabetes complications and ways to prevent them Explain how to manage diabetes during illness Evaluate success in meeting personal goal Establish 2-3 goals that they will plan to diligently work on  Goals:  I will count my carb choices at most meals and snacks I will test my glucose at least 1 times a day, 7 days a week I will look at patterns in my record book  Work on managing my stress  Your patient has identified these potential barriers to change:  Stress Lack of Family Support  Your patient has identified their diabetes self-care support plan as  Careers information officer   Plan:  Attend Support Group as desired

## 2022-10-12 ENCOUNTER — Emergency Department (HOSPITAL_COMMUNITY)
Admission: EM | Admit: 2022-10-12 | Discharge: 2022-10-13 | Disposition: A | Discharge status: Home / Self Care | Payer: Commercial Managed Care - PPO | Attending: Emergency Medicine | Admitting: Emergency Medicine

## 2022-10-12 ENCOUNTER — Emergency Department (HOSPITAL_COMMUNITY): Payer: Commercial Managed Care - PPO

## 2022-10-12 ENCOUNTER — Other Ambulatory Visit (HOSPITAL_COMMUNITY): Payer: Commercial Managed Care - PPO

## 2022-10-12 DIAGNOSIS — I10 Essential (primary) hypertension: Secondary | ICD-10-CM | POA: Diagnosis not present

## 2022-10-12 DIAGNOSIS — J45909 Unspecified asthma, uncomplicated: Secondary | ICD-10-CM | POA: Insufficient documentation

## 2022-10-12 DIAGNOSIS — E119 Type 2 diabetes mellitus without complications: Secondary | ICD-10-CM | POA: Diagnosis not present

## 2022-10-12 DIAGNOSIS — R519 Headache, unspecified: Secondary | ICD-10-CM | POA: Diagnosis present

## 2022-10-12 DIAGNOSIS — Z7984 Long term (current) use of oral hypoglycemic drugs: Secondary | ICD-10-CM | POA: Insufficient documentation

## 2022-10-12 DIAGNOSIS — J449 Chronic obstructive pulmonary disease, unspecified: Secondary | ICD-10-CM | POA: Diagnosis not present

## 2022-10-12 DIAGNOSIS — G510 Bell's palsy: Secondary | ICD-10-CM | POA: Diagnosis not present

## 2022-10-12 DIAGNOSIS — Z85828 Personal history of other malignant neoplasm of skin: Secondary | ICD-10-CM | POA: Insufficient documentation

## 2022-10-12 LAB — CBC WITH DIFFERENTIAL/PLATELET
Abs Immature Granulocytes: 0.04 10*3/uL (ref 0.00–0.07)
Basophils Absolute: 0 10*3/uL (ref 0.0–0.1)
Basophils Relative: 0 %
Eosinophils Absolute: 0 10*3/uL (ref 0.0–0.5)
Eosinophils Relative: 0 %
HCT: 40.9 % (ref 36.0–46.0)
Hemoglobin: 14.3 g/dL (ref 12.0–15.0)
Immature Granulocytes: 0 %
Lymphocytes Relative: 17 %
Lymphs Abs: 1.9 10*3/uL (ref 0.7–4.0)
MCH: 28.6 pg (ref 26.0–34.0)
MCHC: 35 g/dL (ref 30.0–36.0)
MCV: 81.8 fL (ref 80.0–100.0)
Monocytes Absolute: 0.8 10*3/uL (ref 0.1–1.0)
Monocytes Relative: 8 %
Neutro Abs: 8.3 10*3/uL — ABNORMAL HIGH (ref 1.7–7.7)
Neutrophils Relative %: 75 %
Platelets: 264 10*3/uL (ref 150–400)
RBC: 5 MIL/uL (ref 3.87–5.11)
RDW: 12.8 % (ref 11.5–15.5)
WBC: 11.1 10*3/uL — ABNORMAL HIGH (ref 4.0–10.5)
nRBC: 0 % (ref 0.0–0.2)

## 2022-10-12 LAB — COMPREHENSIVE METABOLIC PANEL
ALT: 16 U/L (ref 0–44)
AST: 14 U/L — ABNORMAL LOW (ref 15–41)
Albumin: 4.2 g/dL (ref 3.5–5.0)
Alkaline Phosphatase: 60 U/L (ref 38–126)
Anion gap: 12 (ref 5–15)
BUN: 17 mg/dL (ref 6–20)
CO2: 26 mmol/L (ref 22–32)
Calcium: 9.8 mg/dL (ref 8.9–10.3)
Chloride: 101 mmol/L (ref 98–111)
Creatinine, Ser: 0.82 mg/dL (ref 0.44–1.00)
GFR, Estimated: >60 mL/min (ref >60)
Glucose, Bld: 114 mg/dL — ABNORMAL HIGH (ref 70–99)
Potassium: 3.7 mmol/L (ref 3.5–5.1)
Sodium: 139 mmol/L (ref 135–145)
Total Bilirubin: 0.8 mg/dL (ref 0.3–1.2)
Total Protein: 6.9 g/dL (ref 6.5–8.1)

## 2022-10-12 MED ORDER — DEXAMETHASONE SODIUM PHOSPHATE 10 MG/ML IJ SOLN
10.0000 mg | Freq: Once | INTRAMUSCULAR | Status: AC
  Administered 2022-10-13: 10 mg via INTRAVENOUS
  Filled 2022-10-12: qty 1

## 2022-10-12 MED ORDER — SODIUM CHLORIDE 0.9 % IV BOLUS
500.0000 mL | Freq: Once | INTRAVENOUS | Status: AC
  Administered 2022-10-13: 500 mL via INTRAVENOUS

## 2022-10-12 MED ORDER — ACETAMINOPHEN 500 MG PO TABS
1000.0000 mg | ORAL_TABLET | Freq: Once | ORAL | Status: AC
  Administered 2022-10-13: 1000 mg via ORAL
  Filled 2022-10-12: qty 2

## 2022-10-12 MED ORDER — DIPHENHYDRAMINE HCL 50 MG/ML IJ SOLN
12.5000 mg | Freq: Once | INTRAMUSCULAR | Status: AC
  Administered 2022-10-13: 12.5 mg via INTRAVENOUS
  Filled 2022-10-12: qty 1

## 2022-10-12 MED ORDER — PROCHLORPERAZINE EDISYLATE 10 MG/2ML IJ SOLN
10.0000 mg | Freq: Once | INTRAMUSCULAR | Status: AC
  Administered 2022-10-13: 10 mg via INTRAVENOUS
  Filled 2022-10-12: qty 2
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
## 2022-10-12 NOTE — ED Provider Triage Note (Signed)
Emergency Medicine Provider Triage Evaluation Note  [name] , a 52 y.o. female  was evaluated in triage.  Pt complains of headache.  Patient reports headache upon awakening earlier today gradually worsened throughout the day.  Patient with history of migraines for which she has stimulator in place with backup intranasal ketamine.  States she tried all her settings on her stimulator as well as intranasal ketamine without relief of symptoms.  Call neurologist office earlier today and was told to come to emergency department for evaluation.  Reports right-sided facial droop secondary to known Bell's palsy of which is not new.  Also reports right-sided hypersensitivity around cranial nerve V distributions of which is not new.  Denies visual disturbance, gait abnormalities, weakness/sensory deficits in upper or lower extremities.  Review of Systems  Positive:  Negative: See above  Physical Exam  BP 114/80   Pulse 85   Temp 98.3 F (36.8 C) (Oral)   Resp 16   SpO2 95%  Gen:   Awake, no distress   Resp:  Normal effort MSK:   Moves extremities without difficulty  Other:  No acute findings on cranial nerve exam.  Medical Decision Making  Medically screening exam initiated at 6:51 PM.  Appropriate orders placed.  [name] was informed that the remainder of the evaluation will be completed by another provider, this initial triage assessment does not replace that evaluation, and the importance of remaining in the ED until their evaluation is complete.     Wilnette Kales, [place] 10/12/22 [phone]

## 2022-10-12 NOTE — ED Triage Notes (Signed)
Patient here for evaluation of a migraine present on waking this morning, went to bed sometimes before 2200 yesterday without headache.  Patient has history of migraines, has nerve stimulator and ketamine nasal spray for migraines that the stimulator does not help, patient reports no improvement after IN ketamine. Patient is alert, oriented, and in no apparent distress at this time.

## 2022-10-13 ENCOUNTER — Encounter (HOSPITAL_COMMUNITY): Payer: Self-pay

## 2022-10-13 ENCOUNTER — Other Ambulatory Visit: Payer: Self-pay

## 2022-10-13 DIAGNOSIS — R519 Headache, unspecified: Secondary | ICD-10-CM | POA: Diagnosis not present

## 2022-10-13 MED ORDER — FENTANYL CITRATE PF 50 MCG/ML IJ SOSY: 75.0000 ug | PREFILLED_SYRINGE | Freq: Once | INTRAMUSCULAR | Status: DC

## 2022-10-13 MED ORDER — HYDROMORPHONE HCL 1 MG/ML IJ SOLN
1.0000 mg | Freq: Once | INTRAMUSCULAR | Status: AC
  Administered 2022-10-13: 1 mg via INTRAVENOUS
  Filled 2022-10-13: qty 1

## 2022-12-13 ENCOUNTER — Ambulatory Visit (INDEPENDENT_AMBULATORY_CARE_PROVIDER_SITE_OTHER): Payer: Commercial Managed Care - PPO | Admitting: Podiatry

## 2022-12-13 ENCOUNTER — Encounter: Payer: Self-pay | Admitting: Podiatry

## 2022-12-13 DIAGNOSIS — S90821A Blister (nonthermal), right foot, initial encounter: Secondary | ICD-10-CM

## 2022-12-13 NOTE — Progress Notes (Signed)
Subjective:  Patient ID: [name], female    DOB: 1971-02-21,  MRN: [id number]  Chief Complaint  Patient presents with   Foot Pain    Sub 5th MPJ right - red, blistered area x 1 day, runs every morning and noticed it after she got back, very tender    52 y.o. female presents with concern for callus and blister subfifth MPJ on the right foot.  She says this started approximately last week.  She runs and she thinks that her over-the-counter insert that is three-quarter length may have caused it as it was slipping in her shoe.  She does have a history of diabetes but denies any neuropathy in or numbness in the foot.  She is having significant pain at the site of the callus lesion present on the right foot.  Denies any open wound or drainage.  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    Sleep apnea    Thyroid disease     Allergies  Allergen Reactions   Famotidine     Other reaction(s): Mental Status Changes (intolerance), Other (See Comments) Altered mentation    Flagyl [Metronidazole] Anaphylaxis and Swelling    Tongue swelling/ confusion   Lactose Diarrhea    Other reaction(s): Other (See Comments)   Dilaudid [Hydromorphone Hcl] Itching   Hydromorphone    Ketorolac Tromethamine Swelling   Nsaids     Can't take for fibromyalgia    Salicylates Other (See Comments)    unknown   Valproic Acid      Other reaction(s): Other (See Comments) Hair loss   Hydromorphone Hcl Itching    Needs to have Benedryl   Iodinated Contrast Media Other (See Comments) and Rash    unknown   Prednisone Anxiety and Other (See Comments)    "anxiety"   Tape Itching, Dermatitis, Rash and Other (See Comments)    Blisters - any type of tape and Band-Aids        ROS: Negative except as per HPI above  Objective:  General: AAO x3, NAD  Dermatological: Hyperkeratotic tissue subfifth MPJ on the right foot.  There is very significantly tender to palpation at this area there is erythema and evidence of prior serous blister with necrotic skin.  No underlying ulceration or drainage noted.  No surrounding or creaking erythema.  Vascular:  Dorsalis Pedis artery and Posterior Tibial artery pedal pulses are 2/4 bilateral.  Capillary fill time < 3 sec to all digits.   Neruologic: Grossly intact via light touch bilateral. Protective threshold intact to all sites bilateral.   Musculoskeletal: No gross boney pedal deformities bilateral. No pain, crepitus, or limitation noted with foot and ankle range of motion bilateral. Muscular strength 5/5 in all groups tested bilateral.  Gait: Unassisted, Nonantalgic.   No images  are attached to the encounter.  Radiographs:  Deferred at this visit Assessment:   1. Blister of plantar aspect of foot, right, initial encounter      Plan:  Patient was evaluated and treated and all questions answered.  # Serous blister subfifth MPJ on the right foot -Discussed with the patient that I do believe she sustained a friction blister under the fifth metatarsal phalange joint of the right foot. -This is likely attributable to her irritation from her over-the-counter three-quarter length inserts and having rubbing on the area in her shoes when running -Recommend discontinuation of running for the time being the patient is okay to walk but I want her to avoid any repetitive running  motion -Recommend using liquid Band-Aid on the painful area and covering that daily to prevent pressure on the area and irritation of the new skin -I also recommend full length over-the-counter inserts and recommend power steps.  Patient was agreeable dispensed 1 pair of power steps at this visit. -Patient will follow-up in 2 weeks to make sure that this area is resolving with less pain and the power steps are working.  We will consider custom-made orthotics if needing further orthotic management  Return in about 2 weeks (around 12/27/2022) for Follow-up right fifth MPJ blister.          Everitt Amber, DPM [facility]

## 2022-12-27 ENCOUNTER — Ambulatory Visit (INDEPENDENT_AMBULATORY_CARE_PROVIDER_SITE_OTHER): Payer: Commercial Managed Care - PPO | Admitting: Podiatry

## 2022-12-27 DIAGNOSIS — S90821D Blister (nonthermal), right foot, subsequent encounter: Secondary | ICD-10-CM | POA: Diagnosis not present

## 2022-12-27 NOTE — Progress Notes (Signed)
Subjective:  Patient ID: [name], female    DOB: 05/30/1971,  MRN: [id number]  Chief Complaint  Patient presents with   Follow-up    Patient here to recheck callouse on the right foot, ball of foot, near 5th digit, patient says she's doing well    52 y.o. female presents for follow-up of right foot serous blister on the plantar surface of the fifth MPJ area.  She thinks this is fully healed.  She was compliant with wound care and preventing pressure on the area.  No pain at this point.  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    Sleep apnea    Thyroid disease     Allergies  Allergen Reactions   Famotidine     Other reaction(s): Mental Status Changes (intolerance), Other (See Comments) Altered mentation    Flagyl [Metronidazole] Anaphylaxis and Swelling    Tongue swelling/ confusion   Lactose Diarrhea    Other reaction(s): Other (See Comments)   Dilaudid [Hydromorphone Hcl] Itching   Hydromorphone    Ketorolac Tromethamine Swelling   Nsaids     Can't take for fibromyalgia    Salicylates Other (See Comments)    unknown   Valproic Acid     Other reaction(s): Other (See Comments) Hair loss   Hydromorphone Hcl Itching    Needs to have Benedryl   Iodinated Contrast Media Other (See Comments) and Rash    unknown   Prednisone Anxiety and Other (See Comments)    "anxiety"   Tape Itching,  Dermatitis, Rash and Other (See Comments)    Blisters - any type of tape and Band-Aids        ROS: Negative except as per HPI above  Objective:  General: AAO x3, NAD  Dermatological: Prior area of serous blister is now healed with healthy skin at site of prior superficial blister.  No erythema edema or drainage.  Vascular:  Dorsalis Pedis artery and Posterior Tibial artery pedal pulses are 2/4 bilateral.  Capillary fill time < 3 sec to all digits.   Neruologic: Grossly intact via light touch bilateral. Protective threshold intact to all sites bilateral.   Musculoskeletal: No gross boney pedal deformities bilateral. No pain, crepitus, or limitation noted with foot and ankle range of motion bilateral. Muscular strength 5/5 in all groups tested bilateral.  Gait: Unassisted, Nonantalgic.   No images are attached to the encounter.  Radiographs:  Deferred at this visit Assessment:   1. Blister of plantar aspect of foot, right, subsequent encounter       Plan:  Patient was evaluated and treated and all questions answered.  # Serous blister subfifth MPJ on the right foot -The area has now fully healed with healthy skin overlying the prior site of blister  -No further wound  care needed -Recommend lotion applied to the area on a daily basis -Monitor for any areas of friction and prevent friction to prevent worsening blistering or breakdown of skin.  No follow-ups on file.          Corinna Gab, DPM [facility]

## 2023-10-14 ENCOUNTER — Encounter (HOSPITAL_COMMUNITY): Payer: Self-pay | Admitting: Psychiatry

## 2023-10-14 ENCOUNTER — Ambulatory Visit (INDEPENDENT_AMBULATORY_CARE_PROVIDER_SITE_OTHER)
Admission: EM | Admit: 2023-10-14 | Discharge: 2023-10-14 | Disposition: A | Discharge status: Other Acute Inpatient Hospital | Payer: Commercial Managed Care - PPO | Source: Home / Self Care

## 2023-10-14 ENCOUNTER — Other Ambulatory Visit: Payer: Self-pay

## 2023-10-14 ENCOUNTER — Inpatient Hospital Stay (HOSPITAL_COMMUNITY)
Admission: AD | Admit: 2023-10-14 | Discharge: 2023-10-19 | DRG: 885 | Disposition: A | Discharge status: Home / Self Care | Payer: Commercial Managed Care - PPO | Source: Intra-hospital | Attending: Psychiatry | Admitting: Psychiatry

## 2023-10-14 DIAGNOSIS — Z886 Allergy status to analgesic agent status: Secondary | ICD-10-CM

## 2023-10-14 DIAGNOSIS — G9332 Myalgic encephalomyelitis/chronic fatigue syndrome: Secondary | ICD-10-CM | POA: Insufficient documentation

## 2023-10-14 DIAGNOSIS — G4700 Insomnia, unspecified: Secondary | ICD-10-CM | POA: Insufficient documentation

## 2023-10-14 DIAGNOSIS — Z91048 Other nonmedicinal substance allergy status: Secondary | ICD-10-CM

## 2023-10-14 DIAGNOSIS — Z9152 Personal history of nonsuicidal self-harm: Secondary | ICD-10-CM | POA: Diagnosis not present

## 2023-10-14 DIAGNOSIS — I10 Essential (primary) hypertension: Secondary | ICD-10-CM | POA: Diagnosis present

## 2023-10-14 DIAGNOSIS — F411 Generalized anxiety disorder: Secondary | ICD-10-CM | POA: Insufficient documentation

## 2023-10-14 DIAGNOSIS — E7800 Pure hypercholesterolemia, unspecified: Secondary | ICD-10-CM | POA: Diagnosis present

## 2023-10-14 DIAGNOSIS — Z818 Family history of other mental and behavioral disorders: Secondary | ICD-10-CM

## 2023-10-14 DIAGNOSIS — F609 Personality disorder, unspecified: Secondary | ICD-10-CM | POA: Diagnosis present

## 2023-10-14 DIAGNOSIS — Z833 Family history of diabetes mellitus: Secondary | ICD-10-CM | POA: Diagnosis not present

## 2023-10-14 DIAGNOSIS — Z62810 Personal history of physical and sexual abuse in childhood: Secondary | ICD-10-CM | POA: Insufficient documentation

## 2023-10-14 DIAGNOSIS — F4310 Post-traumatic stress disorder, unspecified: Secondary | ICD-10-CM | POA: Insufficient documentation

## 2023-10-14 DIAGNOSIS — J310 Chronic rhinitis: Secondary | ICD-10-CM | POA: Diagnosis present

## 2023-10-14 DIAGNOSIS — Z79899 Other long term (current) drug therapy: Secondary | ICD-10-CM | POA: Insufficient documentation

## 2023-10-14 DIAGNOSIS — G43909 Migraine, unspecified, not intractable, without status migrainosus: Secondary | ICD-10-CM | POA: Diagnosis present

## 2023-10-14 DIAGNOSIS — F1411 Cocaine abuse, in remission: Secondary | ICD-10-CM | POA: Diagnosis present

## 2023-10-14 DIAGNOSIS — Z91041 Radiographic dye allergy status: Secondary | ICD-10-CM

## 2023-10-14 DIAGNOSIS — Z9151 Personal history of suicidal behavior: Secondary | ICD-10-CM

## 2023-10-14 DIAGNOSIS — F332 Major depressive disorder, recurrent severe without psychotic features: Principal | ICD-10-CM | POA: Diagnosis present

## 2023-10-14 DIAGNOSIS — M797 Fibromyalgia: Secondary | ICD-10-CM | POA: Diagnosis present

## 2023-10-14 DIAGNOSIS — J4489 Other specified chronic obstructive pulmonary disease: Secondary | ICD-10-CM | POA: Diagnosis present

## 2023-10-14 DIAGNOSIS — F339 Major depressive disorder, recurrent, unspecified: Secondary | ICD-10-CM | POA: Insufficient documentation

## 2023-10-14 DIAGNOSIS — F319 Bipolar disorder, unspecified: Principal | ICD-10-CM | POA: Diagnosis present

## 2023-10-14 DIAGNOSIS — Z820 Family history of epilepsy and other diseases of the nervous system: Secondary | ICD-10-CM

## 2023-10-14 DIAGNOSIS — Z5986 Financial insecurity: Secondary | ICD-10-CM

## 2023-10-14 DIAGNOSIS — E119 Type 2 diabetes mellitus without complications: Secondary | ICD-10-CM | POA: Insufficient documentation

## 2023-10-14 DIAGNOSIS — G4733 Obstructive sleep apnea (adult) (pediatric): Secondary | ICD-10-CM | POA: Diagnosis present

## 2023-10-14 DIAGNOSIS — F429 Obsessive-compulsive disorder, unspecified: Secondary | ICD-10-CM | POA: Insufficient documentation

## 2023-10-14 DIAGNOSIS — R45851 Suicidal ideations: Secondary | ICD-10-CM | POA: Insufficient documentation

## 2023-10-14 DIAGNOSIS — Z888 Allergy status to other drugs, medicaments and biological substances status: Secondary | ICD-10-CM

## 2023-10-14 DIAGNOSIS — Z7985 Long-term (current) use of injectable non-insulin antidiabetic drugs: Secondary | ICD-10-CM | POA: Insufficient documentation

## 2023-10-14 DIAGNOSIS — F3130 Bipolar disorder, current episode depressed, mild or moderate severity, unspecified: Secondary | ICD-10-CM | POA: Diagnosis present

## 2023-10-14 DIAGNOSIS — K219 Gastro-esophageal reflux disease without esophagitis: Secondary | ICD-10-CM | POA: Diagnosis present

## 2023-10-14 DIAGNOSIS — Z885 Allergy status to narcotic agent status: Secondary | ICD-10-CM

## 2023-10-14 LAB — COMPREHENSIVE METABOLIC PANEL
ALT: 19 U/L (ref 0–44)
AST: 18 U/L (ref 15–41)
Albumin: 4.1 g/dL (ref 3.5–5.0)
Alkaline Phosphatase: 69 U/L (ref 38–126)
Anion gap: 9 (ref 5–15)
BUN: 12 mg/dL (ref 6–20)
CO2: 23 mmol/L (ref 22–32)
Calcium: 9.6 mg/dL (ref 8.9–10.3)
Chloride: 106 mmol/L (ref 98–111)
Creatinine, Ser: 0.73 mg/dL (ref 0.44–1.00)
GFR, Estimated: >60 mL/min (ref >60)
Glucose, Bld: 101 mg/dL — ABNORMAL HIGH (ref 70–99)
Potassium: 4 mmol/L (ref 3.5–5.1)
Sodium: 138 mmol/L (ref 135–145)
Total Bilirubin: 0.6 mg/dL (ref 0.0–1.2)
Total Protein: 6.9 g/dL (ref 6.5–8.1)

## 2023-10-14 LAB — CBC WITH DIFFERENTIAL/PLATELET
Abs Immature Granulocytes: 0.04 10*3/uL (ref 0.00–0.07)
Basophils Absolute: 0.1 10*3/uL (ref 0.0–0.1)
Basophils Relative: 1 %
Eosinophils Absolute: 0.2 10*3/uL (ref 0.0–0.5)
Eosinophils Relative: 3 %
HCT: 41.1 % (ref 36.0–46.0)
Hemoglobin: 13.7 g/dL (ref 12.0–15.0)
Immature Granulocytes: 1 %
Lymphocytes Relative: 27 %
Lymphs Abs: 1.9 10*3/uL (ref 0.7–4.0)
MCH: 27 pg (ref 26.0–34.0)
MCHC: 33.3 g/dL (ref 30.0–36.0)
MCV: 80.9 fL (ref 80.0–100.0)
Monocytes Absolute: 0.6 10*3/uL (ref 0.1–1.0)
Monocytes Relative: 9 %
Neutro Abs: 4.2 10*3/uL (ref 1.7–7.7)
Neutrophils Relative %: 59 %
Platelets: 251 10*3/uL (ref 150–400)
RBC: 5.08 MIL/uL (ref 3.87–5.11)
RDW: 13.1 % (ref 11.5–15.5)
WBC: 7 10*3/uL (ref 4.0–10.5)
nRBC: 0 % (ref 0.0–0.2)

## 2023-10-14 LAB — URINALYSIS, ROUTINE W REFLEX MICROSCOPIC
Bilirubin Urine: NEGATIVE
Glucose, UA: NEGATIVE mg/dL
Hgb urine dipstick: NEGATIVE
Ketones, ur: NEGATIVE mg/dL
Leukocytes,Ua: NEGATIVE
Nitrite: NEGATIVE
Protein, ur: NEGATIVE mg/dL
Specific Gravity, Urine: 1.016 (ref 1.005–1.030)
pH: 5 (ref 5.0–8.0)

## 2023-10-14 LAB — LIPID PANEL
Cholesterol: 178 mg/dL (ref 0–200)
HDL: 37 mg/dL — ABNORMAL LOW (ref >40)
LDL Cholesterol: 104 mg/dL — ABNORMAL HIGH (ref 0–99)
Total CHOL/HDL Ratio: 4.8 {ratio}
Triglycerides: 187 mg/dL — ABNORMAL HIGH (ref <150)
VLDL: 37 mg/dL (ref 0–40)

## 2023-10-14 LAB — POCT URINE DRUG SCREEN - MANUAL ENTRY (I-SCREEN)
POC Amphetamine UR: NOT DETECTED
POC Buprenorphine (BUP): NOT DETECTED
POC Cocaine UR: NOT DETECTED
POC Marijuana UR: NOT DETECTED
POC Methadone UR: NOT DETECTED
POC Methamphetamine UR: NOT DETECTED
POC Morphine: NOT DETECTED
POC Oxazepam (BZO): POSITIVE — AB
POC Oxycodone UR: NOT DETECTED
POC Secobarbital (BAR): NOT DETECTED

## 2023-10-14 LAB — HEMOGLOBIN A1C
Hgb A1c MFr Bld: 4.9 % (ref 4.8–5.6)
Mean Plasma Glucose: 93.93 mg/dL

## 2023-10-14 LAB — TSH: TSH: 3.319 u[IU]/mL (ref 0.350–4.500)

## 2023-10-14 LAB — POC URINE PREG, ED: Preg Test, Ur: NEGATIVE

## 2023-10-14 MED ORDER — ACETAMINOPHEN 325 MG PO TABS: 650.0000 mg | ORAL_TABLET | Freq: Four times a day (QID) | ORAL | Status: DC | PRN

## 2023-10-14 MED ORDER — MAGNESIUM HYDROXIDE 400 MG/5ML PO SUSP: 30.0000 mL | Freq: Every day | ORAL | Status: DC | PRN

## 2023-10-14 MED ORDER — HYDROXYZINE HCL 50 MG PO TABS
50.0000 mg | ORAL_TABLET | Freq: Three times a day (TID) | ORAL | Status: DC | PRN
  Administered 2023-10-14: 50 mg via ORAL
  Filled 2023-10-14 (×2): qty 1

## 2023-10-14 MED ORDER — TRAZODONE HCL 50 MG PO TABS: 50.0000 mg | ORAL_TABLET | Freq: Every evening | ORAL | Status: DC | PRN

## 2023-10-14 MED ORDER — HALOPERIDOL 5 MG PO TABS: 5.0000 mg | ORAL_TABLET | Freq: Three times a day (TID) | ORAL | Status: DC | PRN

## 2023-10-14 MED ORDER — DIPHENHYDRAMINE HCL 25 MG PO CAPS: 50.0000 mg | ORAL_CAPSULE | Freq: Three times a day (TID) | ORAL | Status: DC | PRN

## 2023-10-14 MED ORDER — HYDROXYZINE HCL 25 MG PO TABS: 50.0000 mg | ORAL_TABLET | Freq: Three times a day (TID) | ORAL | Status: DC | PRN

## 2023-10-14 MED ORDER — DIPHENHYDRAMINE HCL 50 MG PO CAPS: 50.0000 mg | ORAL_CAPSULE | Freq: Three times a day (TID) | ORAL | Status: DC | PRN

## 2023-10-14 MED ORDER — DIPHENHYDRAMINE HCL 50 MG/ML IJ SOLN: 50.0000 mg | Freq: Three times a day (TID) | INTRAMUSCULAR | Status: DC | PRN

## 2023-10-14 MED ORDER — LORAZEPAM 2 MG/ML IJ SOLN: 2.0000 mg | Freq: Three times a day (TID) | INTRAMUSCULAR | Status: DC | PRN

## 2023-10-14 MED ORDER — TRAZODONE HCL 50 MG PO TABS
50.0000 mg | ORAL_TABLET | Freq: Every evening | ORAL | Status: DC | PRN
  Administered 2023-10-14 – 2023-10-15 (×2): 50 mg via ORAL
  Filled 2023-10-14 (×2): qty 1

## 2023-10-14 MED ORDER — ALUM & MAG HYDROXIDE-SIMETH 200-200-20 MG/5ML PO SUSP: 30.0000 mL | ORAL | Status: DC | PRN

## 2023-10-14 MED ORDER — HALOPERIDOL LACTATE 5 MG/ML IJ SOLN: 5.0000 mg | Freq: Three times a day (TID) | INTRAMUSCULAR | Status: DC | PRN

## 2023-10-14 NOTE — [facility] Group Notes (Signed)
[facility] Group Notes:  (Nursing/MHT/Case Management/Adjunct)  Date:  10/14/2023  Time:  10:17 PM  Type of Therapy:   AA/Wrap-up groups  Participation Level:  Did Not Attend  Participation Quality:    Affect:    Cognitive:    Insight:    Engagement in Group:    Modes of Intervention:    Summary of Progress/Problems: Pt refused to attend AA and Wrap-up group.  Noah Delaine 10/14/2023, 10:17 PM

## 2023-10-14 NOTE — Progress Notes (Signed)
   10/14/23 1742  Psych Admission Type (Psych Patients Only)  Admission Status Voluntary  Psychosocial Assessment  Patient Complaints Depression;Hopelessness  Eye Contact Fair  Facial Expression Flat  Affect Sad  Speech Logical/coherent  Interaction Assertive  Motor Activity Slow  Appearance/Hygiene Unremarkable  Behavior Characteristics Cooperative  Mood Depressed  Thought Process  Coherency WDL  Content WDL  Delusions None reported or observed  Perception WDL  Hallucination None reported or observed  Judgment WDL  Confusion None  Danger to Self  Current suicidal ideation? Denies (denies)  Agreement Not to Harm Self Yes  Description of Agreement verbal  Danger to Others  Danger to Others None reported or observed   Pt reports experiencing SI and states she is here because she would like "to find a reason to live." Pt lives with spouse, daughter and dad but pt states she feels she has no support from family. Pt denies alcohol, drug or tobacco use. Pt states she would like to work on finding new coping skills while here.
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
## 2023-10-14 NOTE — Progress Notes (Signed)
Pt has been accepted to [facility] on 10/14/2023 Bed assignment: 300-2  Pt meets inpatient criteria per: Starleen Blue RN  Attending Physician will be: Dr. Sarita Bottom, MD   Report can be called to:  [phone] -Adult unit: [phone]  Pt can arrive with Pending labs, vol, EKG, UDS    Care Team Notified: [facility] Rona Ravens RN, Starleen Blue RN, [place] RN,  [place] Bard Haupert LCSW-A   10/14/2023 12:59 PM

## 2023-10-14 NOTE — ED Notes (Signed)
Patient admitted to continuing assessment until labs result for [facility] admittance.  She is cooperative and calm.  She is alert to self, time, place and situation.  Patient reports that she has had multiple back and neck surgeries, a wrist surgery and a surgery on her L buttock.  Patient oriented to surroundings, provided the opportunity for questions.
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
## 2023-10-14 NOTE — Tx Team (Signed)
Initial Treatment Plan 10/14/2023 7:57 PM [name] [id number]:[id number]    PATIENT STRESSORS: Health problems     PATIENT STRENGTHS: General fund of knowledge  Motivation for treatment/growth    PATIENT IDENTIFIED PROBLEMS: MDD                     DISCHARGE CRITERIA:  Ability to meet basic life and health needs Motivation to continue treatment in a less acute level of care Need for constant or close observation no longer present Verbal commitment to aftercare and medication compliance  PRELIMINARY DISCHARGE PLAN: Attend aftercare/continuing care group Outpatient therapy Return to previous living arrangement  PATIENT/FAMILY INVOLVEMENT: This treatment plan has been presented to and reviewed with the patient, [name].  The patient and family have been given the opportunity to ask questions and make suggestions.  Laurance Flatten, RN 10/14/2023, 7:57 PM

## 2023-10-14 NOTE — Progress Notes (Signed)
Patient reports that she cannot participate in the EKG due to a nerve stimulator she has for her back.
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
## 2023-10-15 ENCOUNTER — Encounter (HOSPITAL_COMMUNITY): Payer: Self-pay

## 2023-10-15 DIAGNOSIS — F332 Major depressive disorder, recurrent severe without psychotic features: Secondary | ICD-10-CM | POA: Diagnosis not present

## 2023-10-15 DIAGNOSIS — F411 Generalized anxiety disorder: Secondary | ICD-10-CM | POA: Diagnosis present

## 2023-10-15 MED ORDER — CLONIDINE HCL 0.1 MG PO TABS
0.1000 mg | ORAL_TABLET | Freq: Every day | ORAL | Status: DC
  Administered 2023-10-15 – 2023-10-18 (×4): 0.1 mg via ORAL
  Filled 2023-10-15 (×6): qty 1

## 2023-10-15 MED ORDER — PREGABALIN 75 MG PO CAPS
150.0000 mg | ORAL_CAPSULE | Freq: Three times a day (TID) | ORAL | Status: DC
  Administered 2023-10-15 – 2023-10-19 (×11): 150 mg via ORAL
  Filled 2023-10-15 (×11): qty 2

## 2023-10-15 MED ORDER — ALBUTEROL SULFATE HFA 108 (90 BASE) MCG/ACT IN AERS: 1.0000 | INHALATION_SPRAY | Freq: Four times a day (QID) | RESPIRATORY_TRACT | Status: DC | PRN

## 2023-10-15 MED ORDER — ACYCLOVIR 200 MG PO CAPS
400.0000 mg | ORAL_CAPSULE | Freq: Every day | ORAL | Status: DC
  Administered 2023-10-15 – 2023-10-18 (×4): 400 mg via ORAL
  Filled 2023-10-15 (×6): qty 2

## 2023-10-15 MED ORDER — LURASIDONE HCL 40 MG PO TABS
40.0000 mg | ORAL_TABLET | Freq: Every day | ORAL | Status: DC
  Administered 2023-10-15: 40 mg via ORAL
  Filled 2023-10-15 (×4): qty 1

## 2023-10-15 MED ORDER — LORAZEPAM 0.5 MG PO TABS
0.5000 mg | ORAL_TABLET | Freq: Three times a day (TID) | ORAL | Status: DC
  Administered 2023-10-15 – 2023-10-19 (×12): 0.5 mg via ORAL
  Filled 2023-10-15 (×12): qty 1

## 2023-10-15 MED ORDER — METHOCARBAMOL 500 MG PO TABS
500.0000 mg | ORAL_TABLET | Freq: Three times a day (TID) | ORAL | Status: DC
  Administered 2023-10-15 – 2023-10-19 (×12): 500 mg via ORAL
  Filled 2023-10-15 (×22): qty 1

## 2023-10-15 MED ORDER — HYDROXYZINE HCL 25 MG PO TABS
25.0000 mg | ORAL_TABLET | Freq: Three times a day (TID) | ORAL | Status: DC | PRN
  Administered 2023-10-15 – 2023-10-18 (×3): 25 mg via ORAL
  Filled 2023-10-15 (×3): qty 1

## 2023-10-15 MED ORDER — ALBUTEROL SULFATE (2.5 MG/3ML) 0.083% IN NEBU: 2.5000 mg | INHALATION_SOLUTION | Freq: Four times a day (QID) | RESPIRATORY_TRACT | Status: DC | PRN

## 2023-10-15 MED ORDER — IPRATROPIUM BROMIDE HFA 17 MCG/ACT IN AERS: 2.0000 | INHALATION_SPRAY | Freq: Three times a day (TID) | RESPIRATORY_TRACT | Status: DC | PRN

## 2023-10-15 MED ORDER — LORATADINE 10 MG PO TABS
10.0000 mg | ORAL_TABLET | Freq: Every day | ORAL | Status: DC
Start: 2023-10-16 — End: 2023-10-19
  Administered 2023-10-16 – 2023-10-19 (×4): 10 mg via ORAL
  Filled 2023-10-15 (×5): qty 1

## 2023-10-15 MED ORDER — ACYCLOVIR 400 MG PO TABS
400.0000 mg | ORAL_TABLET | Freq: Every day | ORAL | Status: DC
  Filled 2023-10-15: qty 1

## 2023-10-15 MED ORDER — ISOSORBIDE MONONITRATE ER 30 MG PO TB24
30.0000 mg | ORAL_TABLET | Freq: Every day | ORAL | Status: DC
  Administered 2023-10-16 – 2023-10-19 (×4): 30 mg via ORAL
  Filled 2023-10-15 (×5): qty 1

## 2023-10-15 MED ORDER — PRAVASTATIN SODIUM 10 MG PO TABS
10.0000 mg | ORAL_TABLET | Freq: Every day | ORAL | Status: DC
  Administered 2023-10-16 – 2023-10-18 (×3): 10 mg via ORAL
  Filled 2023-10-15 (×5): qty 1

## 2023-10-15 MED ORDER — VENLAFAXINE HCL ER 37.5 MG PO CP24
37.5000 mg | ORAL_CAPSULE | Freq: Every day | ORAL | Status: AC
  Administered 2023-10-15: 37.5 mg via ORAL
  Filled 2023-10-15 (×2): qty 1

## 2023-10-15 MED ORDER — MINOXIDIL 2.5 MG PO TABS
2.5000 mg | ORAL_TABLET | Freq: Two times a day (BID) | ORAL | Status: DC
  Administered 2023-10-16 – 2023-10-19 (×7): 2.5 mg via ORAL
  Filled 2023-10-15 (×9): qty 1

## 2023-10-15 MED ORDER — VENLAFAXINE HCL ER 75 MG PO CP24
75.0000 mg | ORAL_CAPSULE | Freq: Every day | ORAL | Status: DC
  Administered 2023-10-16 – 2023-10-19 (×4): 75 mg via ORAL
  Filled 2023-10-15 (×5): qty 1

## 2023-10-15 NOTE — Group Note (Signed)
Date:  10/15/2023 Time:  4:45 PM  Group Topic/Focus:  Goals Group:   The focus of this group is to help patients establish daily goals to achieve during treatment and discuss how the patient can incorporate goal setting into their daily lives to aide in recovery. Orientation:   The focus of this group is to educate the patient on the purpose and policies of crisis stabilization and provide a format to answer questions about their admission.  The group details unit policies and expectations of patients while admitted.    Participation Level:  Did Not Attend  Participation Quality:   n/a  Affect:   n/a  Cognitive:   n/a  Insight: None  Engagement in Group:   n/a  Modes of Intervention:   n/a  Additional Comments:   Pt did not attend.   Edmund Hilda Audrianna Driskill 10/15/2023, 4:45 PM

## 2023-10-15 NOTE — Progress Notes (Signed)
   10/14/23 2032  Psych Admission Type (Psych Patients Only)  Admission Status Voluntary  Psychosocial Assessment  Patient Complaints Depression;Sadness;Hopelessness  Eye Contact Fair  Facial Expression Flat  Affect Sad;Depressed  Speech Logical/coherent  Interaction Assertive  Motor Activity Slow  Appearance/Hygiene Unremarkable  Behavior Characteristics Cooperative  Mood Depressed;Anxious  Thought Process  Coherency WDL  Content WDL  Delusions None reported or observed  Perception WDL  Hallucination None reported or observed  Judgment WDL  Confusion WDL  Danger to Self  Current suicidal ideation? Denies  Self-Injurious Behavior No self-injurious ideation or behavior indicators observed or expressed   Agreement Not to Harm Self Yes  Description of Agreement verbal  Danger to Others  Danger to Others None reported or observed

## 2023-10-15 NOTE — Progress Notes (Signed)
   10/15/23 0928  Psych Admission Type (Psych Patients Only)  Admission Status Voluntary  Psychosocial Assessment  Patient Complaints Depression  Eye Contact Fair  Facial Expression Flat  Affect Sad;Depressed  Speech Soft  Interaction Assertive  Motor Activity Slow  Appearance/Hygiene Unremarkable  Behavior Characteristics Cooperative  Mood Depressed;Anxious  Thought Process  Coherency WDL  Content WDL  Delusions None reported or observed  Perception WDL  Hallucination None reported or observed  Judgment WDL  Confusion None  Danger to Self  Self-Injurious Behavior No self-injurious ideation or behavior indicators observed or expressed   Agreement Not to Harm Self Yes  Description of Agreement Verbal  Danger to Others  Danger to Others None reported or observed
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
## 2023-10-15 NOTE — BHH Suicide Risk Assessment (Signed)
Suicide Risk Assessment  Admission Assessment    [facility] Admission Suicide Risk Assessment   Nursing information obtained from:  Patient Demographic factors:  Caucasian Current Mental Status:  NA Loss Factors:  Decline in physical health Historical Factors:  Prior suicide attempts Risk Reduction Factors:  Living with another person, especially a relative  Total Time spent with patient: 1.5 hours Principal Problem: MDD (major depressive disorder), recurrent severe, without psychosis (HCC) Diagnosis:  Principal Problem:   MDD (major depressive disorder), recurrent severe, without psychosis (HCC) Active Problems:   Insomnia   Migraines   OCD (obsessive compulsive disorder)   DM2 (diabetes mellitus, type 2) (HCC)   GAD (generalized anxiety disorder)  Subjective Data: Suicidal ideation  Continued Clinical Symptoms: Distractibility, Impulsivity, Anxiety Symptoms:  Excessive Worry, Obsessive Compulsive Symptoms:   Checking, Counting,, Psychotic Symptoms:   n/a PTSD Symptoms: Re-experiencing:  Flashbacks Intrusive Thoughts Nightmares Hypervigilance:  Yes Avoidance:  Decreased Interest/Participation   The "Alcohol Use Disorders Identification Test", Guidelines for Use in Primary Care, Second Edition.  World Science writer [facility]). Score between 0-7:  no or low risk or alcohol related problems. Score between 8-15:  moderate risk of alcohol related problems. Score between 16-19:  high risk of alcohol related problems. Score 20 or above:  warrants further diagnostic evaluation for alcohol dependence and treatment.  CLINICAL FACTORS:   Obsessive-Compulsive Disorder More than one psychiatric diagnosis Previous Psychiatric Diagnoses and Treatments  Musculoskeletal: Strength & Muscle Tone: within normal limits Gait & Station: normal Patient leans: N/A  Psychiatric Specialty Exam:  Presentation  General Appearance:  Fairly Groomed  Eye Contact: Fair  Speech: Clear and  Coherent  Speech Volume: Normal  Handedness: Right   Mood and Affect  Mood: Depressed; Anxious  Affect: Congruent   Thought Process  Thought Processes: Coherent  Descriptions of Associations:Intact  Orientation:Full (Time, Place and Person)  Thought Content:Logical  History of Schizophrenia/Schizoaffective disorder:No  Duration of Psychotic Symptoms:No data recorded Hallucinations:Hallucinations: None  Ideas of Reference:None  Suicidal Thoughts:Suicidal Thoughts: Yes, Passive SI Active Intent and/or Plan: Without Intent; Without Plan  Homicidal Thoughts:Homicidal Thoughts: No   Sensorium  Memory: Immediate Fair  Judgment: Fair  Insight: Fair   Chartered certified accountant: Fair  Attention Span: Fair  Recall: Fair  Fund of Knowledge: Fair  Language: Good   Psychomotor Activity  Psychomotor Activity: Psychomotor Activity: Normal   Assets  Assets: Communication Skills; Resilience; Social Support; Housing   Sleep  Sleep: Sleep: Poor    Physical Exam: Physical Exam Neurological:     General: No focal deficit present.     Mental Status: She is oriented to person, place, and time.    Review of Systems  Psychiatric/Behavioral:  Positive for depression and suicidal ideas. Negative for hallucinations, memory loss and substance abuse. The patient is nervous/anxious and has insomnia.   All other systems reviewed and are negative.  Blood pressure 102/72, pulse 90, temperature 98.3 F (36.8 C), temperature source Oral, resp. rate 16, height 5\' 2"  (1.575 m), weight 96.4 kg, SpO2 97%. Body mass index is 38.89 kg/m.   COGNITIVE FEATURES THAT CONTRIBUTE TO RISK:  None    SUICIDE RISK:   Moderate:  Frequent suicidal ideation with limited intensity, and duration, some specificity in terms of plans, no associated intent, good self-control, limited dysphoria/symptomatology, some risk factors present, and identifiable protective  factors, including available and accessible social support.  PLAN OF CARE: See H & P  I certify that inpatient services furnished can reasonably be expected  to improve the patient's condition.   Starleen Blue, NP 10/15/2023, 5:50 PM

## 2023-10-15 NOTE — H&P (Signed)
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 symptoms so as ready for discharge  Short Term Goals: Ability to identify changes in lifestyle to reduce recurrence of condition will improve, Ability to verbalize feelings will improve, Ability to disclose and discuss suicidal ideas, Ability to demonstrate self-control will improve, Ability to identify and develop effective coping behaviors will improve, Ability to maintain clinical measurements within normal limits will improve, and Compliance with prescribed medications will improve  Diagnoses Principal Problem:   MDD (major depressive disorder), recurrent  severe, without psychosis (HCC) Active Problems:   Insomnia   Migraines   OCD (obsessive compulsive disorder)   DM2 (diabetes mellitus, type 2) (HCC)   GAD (generalized anxiety disorder)  Treatment Plan: dx: bipolar d/o current episode depressed, gad, ocd  plan:  1. reduce latuda from 80 mg with dinner to 40 mg with dinner  consider starting lamotrigine to replace latuda for bipolar depression  2. stop celexa  3. start effexor 37.5 mg every day, and incr to 75 mg every day tomorrow   4. hold stimulants. 5. change ativan (taken 1 mg at 8am and 12pm) to 0.5 q8H  6. continue lyrica. 7. conitnue clonidine 01. mg at bedtime for now   PRNS -Continue Tylenol 650 mg every 6 hours PRN for mild pain -Continue Maalox 30 mg every 4 hrs PRN for indigestion -Continue Milk of Magnesia as needed every 6 hrs for constipation  Labs Reviewed: positive for Benzos, TSH, lipid panel, Ha1c, CMP, CBC, UA & Upreg reviewed. EKG ordered.   Discharge Planning: Social work and case management to assist with discharge planning and identification of hospital follow-up needs prior to discharge Estimated LOS: 5-7 days Discharge Concerns: Need to establish a safety plan; Medication compliance and effectiveness Discharge Goals: Return home with outpatient referrals for mental health follow-up including medication management/psychotherapy  I certify that inpatient services furnished can reasonably be expected to improve the patient's condition.    Starleen Blue, NP 1/28/20255:47 PM

## 2023-10-15 NOTE — Progress Notes (Signed)
   10/15/23 0940  Charting Type  Charting Type Shift assessment  Safety Check Verification  Has the RN verified the 15 minute safety check completion? Yes  Neurological  Neuro (WDL) WDL  HEENT  HEENT (WDL) WDL  Respiratory  Respiratory (WDL) WDL  Cardiac  Cardiac (WDL) WDL  Vascular  Vascular (WDL) WDL  Integumentary  Integumentary (WDL) X (See initial skin assessment)  Braden Scale (Ages 8 and up)  Sensory Perceptions 4  Moisture 4  Activity 4  Mobility 4  Nutrition 3  Friction and Shear 3  Braden Scale Score 22  Musculoskeletal  Musculoskeletal (WDL) WDL  Gastrointestinal  Gastrointestinal (WDL) WDL  GU Assessment  Genitourinary (WDL) WDL  Neurological  Level of Consciousness Alert

## 2023-10-15 NOTE — Group Note (Signed)
Recreation Therapy Group Note   Group Topic:Animal Assisted Therapy   Group Date: 10/15/2023 Start Time: 0944 End Time: 1030 Facilitators: Deyjah Kindel-McCall, LRT,CTRS Location: [address] Dayroom   Animal-Assisted Activity (AAA) Program Checklist/Progress Notes Patient Eligibility Criteria Checklist & Daily Group note for Rec Tx Intervention  AAA/T Program Assumption of Risk Form signed by Patient/ or Parent Legal Guardian Yes  Patient is free of allergies or severe asthma Yes  Patient reports no fear of animals Yes  Patient reports no history of cruelty to animals Yes  Patient understands his/her participation is voluntary Yes  Patient washes hands before animal contact Yes  Patient washes hands after animal contact Yes  Education: Hand Washing, Appropriate Animal Interaction   Education Outcome: Acknowledges education.     Affect/Mood: Appropriate   Participation Level: Moderate   Participation Quality: Independent   Behavior: Attentive    Speech/Thought Process: Focused   Insight: Good   Judgement: Good   Modes of Intervention: Teaching laboratory technician   Patient Response to Interventions:  Attentive   Education Outcome:  In group clarification offered    Clinical Observations/Individualized Feedback: Pt was quiet but attentive. Pt also had some interaction with the therapy dog team during group session.     Plan: Continue to engage patient in RT group sessions 2-3x/week.   Chiffon Kittleson-McCall, LRT,CTRS  10/15/2023 1:38 PM

## 2023-10-15 NOTE — Plan of Care (Signed)
Problem: Education: Goal: Knowledge of [facility] Education information/materials will improve Outcome: Progressing Goal: Emotional status will improve Outcome: Progressing Goal: Mental status will improve Outcome: Progressing Goal: Verbalization of understanding the information provided will improve Outcome: Progressing

## 2023-10-15 NOTE — Group Note (Signed)
LCSW Group Therapy Note   Group Date: 10/14/2023 Start Time: 11:00 End Time: 12:00   Type of Therapy:  Group Therapy   Topic:  Finding Balance: Using Wise Mind for Thoughtful Decisions  Participation:  patient was present and minimally participated in the conversation.  Objective:  the objective of this class is to help participants understand the concept of [place] Mind and learn how to apply it to real-life situations to make balanced, thoughtful decisions. Participants will gain tools to manage emotions, consider logic, and find a middle ground that leads to healthier responses and outcomes.  Goals: Understand the concept of Wise Mind.  Participants will learn the difference between Emotional Mind, Reasonable Mind, and [place] Mind, and how [place] Mind helps in balancing emotions and logic to make thoughtful decisions. Recognize when you're in Emotional Mind or Reasonable Mind.  Participants will identify the signs of Emotional Mind and Reasonable Mind in their own reactions to situations and understand how to move into Wise Mind for more balanced responses. Practice applying [place] Mind to real-life situations.  Through scenarios and group activities, participants will practice using Wise Mind in everyday situations, learning how to acknowledge their emotions, think logically, and create solutions that are thoughtful and balanced.  Summary: In this class, we explored the concept of Wise Mind--the balance between Emotional Mind and Reasonable Mind. We discussed how Emotional Mind can sometimes lead to impulsive, reactive decisions driven by intense feelings, and how Reasonable Mind might ignore feelings altogether, focusing only on facts and logic. [place] Sett[place] Mind is the middle ground that combines both, allowing you to consider your emotions and use logic to make balanced, thoughtful decisions.  We learned how to recognize when we're in Emotional Mind or Reasonable Mind, and practiced using Wise Mind in  real-life situations. By using Alfonse Flavors, we can improve how we handle challenging situations, make better decisions, and strengthen our relationships with others.  Therapeutic Modalities: Elements of DBT   Alfredia Ferguson Cassandra Mcmanaman, LCSWA 10/15/2023  1:19 PM

## 2023-10-16 ENCOUNTER — Encounter (HOSPITAL_COMMUNITY): Payer: Self-pay

## 2023-10-16 ENCOUNTER — Encounter (HOSPITAL_COMMUNITY): Payer: Self-pay | Admitting: Psychiatry

## 2023-10-16 DIAGNOSIS — F332 Major depressive disorder, recurrent severe without psychotic features: Secondary | ICD-10-CM

## 2023-10-16 MED ORDER — TRAZODONE HCL 100 MG PO TABS
100.0000 mg | ORAL_TABLET | Freq: Every day | ORAL | Status: DC
  Administered 2023-10-16 – 2023-10-18 (×3): 100 mg via ORAL
  Filled 2023-10-16 (×4): qty 1

## 2023-10-16 MED ORDER — LURASIDONE HCL 20 MG PO TABS
20.0000 mg | ORAL_TABLET | Freq: Every day | ORAL | Status: AC
  Administered 2023-10-16: 20 mg via ORAL
  Filled 2023-10-16: qty 1

## 2023-10-16 MED ORDER — LAMOTRIGINE 25 MG PO TABS
25.0000 mg | ORAL_TABLET | Freq: Every day | ORAL | Status: DC
  Administered 2023-10-16 – 2023-10-19 (×4): 25 mg via ORAL
  Filled 2023-10-16 (×5): qty 1

## 2023-10-16 NOTE — [facility] Suicide Risk Assessment (Signed)
[facility] INPATIENT:  Family/Significant Other Suicide Prevention Education  Suicide Prevention Education:  Education Completed; Petina Muraski - husband [phone] has been identified by the patient as the family member/significant other with whom the patient will be residing, and identified as the person(s) who will aid the patient in the event of a mental health crisis (suicidal ideations/suicide attempt).  With written consent from the patient, the family member/significant other has been provided the following suicide prevention education, prior to the and/or following the discharge of the patient.  The suicide prevention education provided includes the following: Suicide risk factors Suicide prevention and interventions National Suicide Hotline telephone number [facility] assessment telephone number [facility] Emergency Assistance 911 [facility] and/or Residential Mobile Crisis Unit telephone number  Request made of family/significant other to: Remove weapons (e.g., guns, rifles, knives), all items previously/currently identified as safety concern.   Remove drugs/medications (over-the-counter, prescriptions, illicit drugs), all items previously/currently identified as a safety concern.  The family member/significant other verbalizes understanding of the suicide prevention education information provided.  The family member/significant other agrees to remove the items of safety concern listed above.  Husband confirms that he or [name] father will provided transportation at discharge.   Joelyn Oms [place], LCSW 10/16/2023, 5:46 PM

## 2023-10-16 NOTE — [facility] IP Treatment Plan (Signed)
Interdisciplinary Treatment and Diagnostic Plan Update  10/16/2023 Time of Session: 10:50AM [name] MRN: [id number]  Principal Diagnosis: MDD (major depressive disorder), recurrent severe, without psychosis (HCC)  Secondary Diagnoses: Principal Problem:   MDD (major depressive disorder), recurrent severe, without psychosis (HCC) Active Problems:   Insomnia   Migraines   OCD (obsessive compulsive disorder)   DM2 (diabetes mellitus, type 2) (HCC)   GAD (generalized anxiety disorder)   Current Medications:  Current Facility-Administered Medications  Medication Dose Route Frequency Provider Last Rate Last Admin   acetaminophen (TYLENOL) tablet 650 mg  650 mg Oral Q6H PRN Nkwenti, Doris, NP       acyclovir (ZOVIRAX) 200 MG capsule 400 mg  400 mg Oral QHS Massengill, Harrold Donath, MD   400 mg at 10/15/23 2223   albuterol (VENTOLIN HFA) 108 (90 Base) MCG/ACT inhaler 1 puff  1 puff Inhalation Q6H PRN Massengill, Harrold Donath, MD       alum & mag hydroxide-simeth (MAALOX/MYLANTA) 200-200-20 MG/5ML suspension 30 mL  30 mL Oral Q4H PRN Starleen Blue, NP       cloNIDine (CATAPRES) tablet 0.1 mg  0.1 mg Oral QHS Massengill, Harrold Donath, MD   0.1 mg at 10/15/23 2111   haloperidol (HALDOL) tablet 5 mg  5 mg Oral TID PRN Starleen Blue, NP       And   diphenhydrAMINE (BENADRYL) capsule 50 mg  50 mg Oral TID PRN Starleen Blue, NP       haloperidol lactate (HALDOL) injection 5 mg  5 mg Intramuscular TID PRN Starleen Blue, NP       And   diphenhydrAMINE (BENADRYL) injection 50 mg  50 mg Intramuscular TID PRN Starleen Blue, NP       And   LORazepam (ATIVAN) injection 2 mg  2 mg Intramuscular TID PRN Starleen Blue, NP       hydrOXYzine (ATARAX) tablet 25 mg  25 mg Oral TID PRN Phineas Inches, MD   25 mg at 10/15/23 2223   ipratropium (ATROVENT HFA) inhaler 2 puff  2 puff Inhalation TID PRN Massengill, Harrold Donath, MD       isosorbide mononitrate (IMDUR) 24 hr tablet 30 mg  30 mg Oral Daily Massengill,  Nathan, MD   30 mg at 10/16/23 4098   lamoTRIgine (LAMICTAL) tablet 25 mg  25 mg Oral Daily Bennett, Christal H, NP       loratadine (CLARITIN) tablet 10 mg  10 mg Oral Daily Massengill, Nathan, MD   10 mg at 10/16/23 1191   LORazepam (ATIVAN) tablet 0.5 mg  0.5 mg Oral Q8H Massengill, Nathan, MD   0.5 mg at 10/16/23 4782   lurasidone (LATUDA) tablet 20 mg  20 mg Oral Q supper Bennett, Christal H, NP       magnesium hydroxide (MILK OF MAGNESIA) suspension 30 mL  30 mL Oral Daily PRN Starleen Blue, NP       methocarbamol (ROBAXIN) tablet 500 mg  500 mg Oral Q8H Massengill, Nathan, MD   500 mg at 10/16/23 9562   minoxidil (LONITEN) tablet 2.5 mg  2.5 mg Oral BID Massengill, Harrold Donath, MD   2.5 mg at 10/16/23 1104   pravastatin (PRAVACHOL) tablet 10 mg  10 mg Oral q1800 Cherylin Mylar, RPH       pregabalin (LYRICA) capsule 150 mg  150 mg Oral Q8H Massengill, Nathan, MD   150 mg at 10/16/23 1308   traZODone (DESYREL) tablet 100 mg  100 mg Oral QHS Bennett, Christal H, NP  venlafaxine XR (EFFEXOR-XR) 24 hr capsule 75 mg  75 mg Oral Q breakfast Massengill, Harrold Donath, MD   75 mg at 10/16/23 7829   PTA Medications: Medications Prior to Admission  Medication Sig Dispense Refill Last Dose/Taking   albuterol (PROVENTIL HFA;VENTOLIN HFA) 108 (90 BASE) MCG/ACT inhaler Inhale 1 puff into the lungs every 6 (six) hours as needed for wheezing or shortness of breath.      cetirizine (ZYRTEC) 10 MG tablet Take 10 mg by mouth at bedtime.      citalopram (CELEXA) 20 MG tablet Take 20 mg by mouth at bedtime.      haloperidol lactate (HALDOL) 5 MG/ML injection Inject 1 mL (5 mg total) into the muscle 3 (three) times daily as needed (moderate agitation - physical or verbal threats to self, property or others. Threatening or intimidating stance, gestures or language.).       Patient Stressors: Health problems    Patient Strengths: Automotive engineer for treatment/growth   Treatment Modalities:  Medication Management, Group therapy, Case management,  1 to 1 session with clinician, Psychoeducation, Recreational therapy.   Physician Treatment Plan for Primary Diagnosis: MDD (major depressive disorder), recurrent severe, without psychosis (HCC) Long Term Goal(s): Improvement in symptoms so as ready for discharge   Short Term Goals: Ability to identify changes in lifestyle to reduce recurrence of condition will improve Ability to verbalize feelings will improve Ability to disclose and discuss suicidal ideas Ability to demonstrate self-control will improve Ability to identify and develop effective coping behaviors will improve Ability to maintain clinical measurements within normal limits will improve Compliance with prescribed medications will improve  Medication Management: Evaluate patient's response, side effects, and tolerance of medication regimen.  Therapeutic Interventions: 1 to 1 sessions, Unit Group sessions and Medication administration.  Evaluation of Outcomes: Not Progressing  Physician Treatment Plan for Secondary Diagnosis: Principal Problem:   MDD (major depressive disorder), recurrent severe, without psychosis (HCC) Active Problems:   Insomnia   Migraines   OCD (obsessive compulsive disorder)   DM2 (diabetes mellitus, type 2) (HCC)   GAD (generalized anxiety disorder)  Long Term Goal(s): Improvement in symptoms so as ready for discharge   Short Term Goals: Ability to identify changes in lifestyle to reduce recurrence of condition will improve Ability to verbalize feelings will improve Ability to disclose and discuss suicidal ideas Ability to demonstrate self-control will improve Ability to identify and develop effective coping behaviors will improve Ability to maintain clinical measurements within normal limits will improve Compliance with prescribed medications will improve     Medication Management: Evaluate patient's response, side effects, and  tolerance of medication regimen.  Therapeutic Interventions: 1 to 1 sessions, Unit Group sessions and Medication administration.  Evaluation of Outcomes: Not Progressing   RN Treatment Plan for Primary Diagnosis: MDD (major depressive disorder), recurrent severe, without psychosis (HCC) Long Term Goal(s): Knowledge of disease and therapeutic regimen to maintain health will improve  Short Term Goals: Ability to remain free from injury will improve, Ability to demonstrate self-control, Ability to verbalize feelings will improve, and Ability to disclose and discuss suicidal ideas  Medication Management: RN will administer medications as ordered by provider, will assess and evaluate patient's response and provide education to patient for prescribed medication. RN will report any adverse and/or side effects to prescribing provider.  Therapeutic Interventions: 1 on 1 counseling sessions, Psychoeducation, Medication administration, Evaluate responses to treatment, Monitor vital signs and CBGs as ordered, Perform/monitor CIWA, COWS, AIMS and Fall Risk screenings  as ordered, Perform wound care treatments as ordered.  Evaluation of Outcomes: Not Progressing   LCSW Treatment Plan for Primary Diagnosis: MDD (major depressive disorder), recurrent severe, without psychosis (HCC) Long Term Goal(s): Safe transition to appropriate next level of care at discharge, Engage patient in therapeutic group addressing interpersonal concerns.  Short Term Goals: Engage patient in aftercare planning with referrals and resources, Increase ability to appropriately verbalize feelings, Increase emotional regulation, Identify triggers associated with mental health/substance abuse issues, and Increase skills for wellness and recovery  Therapeutic Interventions: Assess for all discharge needs, 1 to 1 time with Social worker, Explore available resources and support systems, Assess for adequacy in community support network,  Educate family and significant other(s) on suicide prevention, Complete Psychosocial Assessment, Interpersonal group therapy.  Evaluation of Outcomes: Not Progressing   Progress in Treatment: Attending groups: Yes. Participating in groups: Yes. Taking medication as prescribed: Yes. Toleration medication: Yes. Family/Significant other contact made: No, will contact:  Husband, Nikeya Maxim at  [phone] Patient understands diagnosis: Yes. Discussing patient identified problems/goals with staff: Yes. Medical problems stabilized or resolved: Yes. Denies suicidal/homicidal ideation: Yes. Issues/concerns per patient self-inventory: No. Other: No concerns   New problem(s) identified: No, Describe:  None  New Short Term/Long Term Goal(s): medication stabilization, elimination of SI thoughts, development of comprehensive mental wellness plan.    Patient Goals:  "  Discharge Plan or Barriers: Patient recently admitted. CSW will continue to follow and assess for appropriate referrals and possible discharge planning.    Reason for Continuation of Hospitalization: Anxiety Depression Medication stabilization  Estimated Length of Stay:  Last 3 [place] Suicide Severity Risk Score: Flowsheet Row [facility] Most recent reading at 10/14/2023  5:42 PM ED from 10/14/2023 in [facility] Most recent reading at 10/14/2023  1:01 PM ED from 10/12/2022 in [facility] Emergency Department at [facility] Most recent reading at 10/12/2022  6:31 PM  C-SSRS RISK CATEGORY Moderate Risk Low Risk No Risk       Last PHQ 2/9 Scores:    01/04/2022    7:53 PM 08/11/2018    9:27 AM 02/13/2018   12:56 PM  Depression screen PHQ 2/9  Decreased Interest 0 2 3  Down, Depressed, Hopeless 1 2 3   PHQ - 2 Score 1 4 6   Feeling bad or failure about yourself    3  Suicidal thoughts   0  Difficult doing  work/chores   Somewhat difficult    Scribe for Treatment Team: Jacinta Shoe, LCSW 10/16/2023 1:02 PM

## 2023-10-16 NOTE — Plan of Care (Signed)

## 2023-10-16 NOTE — Plan of Care (Signed)
Problem: Education: Goal: Knowledge of [facility] Education information/materials will improve Outcome: Progressing Goal: Emotional status will improve Outcome: Progressing

## 2023-10-16 NOTE — Group Note (Signed)
Date:  10/16/2023 Time:  9:23 PM  Group Topic/Focus:  Wrap-Up Group:   The focus of this group is to help patients review their daily goal of treatment and discuss progress on daily workbooks.    Participation Level:  Active  Participation Quality:  Appropriate and Attentive  Affect:  Flat  Cognitive:  Alert and Appropriate  Insight: Appropriate and Good  Engagement in Group:  Engaged  Modes of Intervention:  Discussion and Education  Additional Comments:  Pt attended and participated in wrap up group this evening and rated their day a 7/10. Pt stated that they "did good" today and was able to go to all the groups today. Pt requested and has received their list of medications. Pt would like to have appointments set up with a new Psychiatrist once they are D/C this weekend.   Chrisandra Netters 10/16/2023, 9:23 PM

## 2023-10-16 NOTE — Progress Notes (Cosign Needed Addendum)
Initial [facility] MD Progress Note  10/16/2023 3:26 PM [name]  MRN:  [id number]   HPI: As per H&P -  Patient is a 53 yo CF with prior reported mental health diagnoses of MDD, GAD, OCD, multiple personality d/o & PTSD who walked into the [place] [facility]) on 1/27 unaccompanied, with worsening suicidal ideations & SI with no plan. Pt reported inability to trust herself to keep self safe at home or in the community. She was transferred and admitted voluntarily to the [facility] on same day for treatment and stabilization of her mental status.    Chart Review from last 24 hours: The patient's chart and nursing notes were reviewed. The patient's case was discussed in multidisciplinary team meeting.  Sleep Hours last night: 7.5 hours per nursing documentation Nursing Concerns: None reported Behavioral episodes in the past 24 hrs: None reported Medication Compliance: Compliant Vital Signs in the past 24 hrs: Within defined limits  PRN Medications in the past 24 hrs: Hydroxyzine and trazodone    Information obtained during today's assessment:  During today's rounds, the patient was observed in her room and reported that she had just finished showering.  On assessment today, she described her mood as "good" and stated that she was feeling a little better.  She rated both her anxiety and depression as a 5 on a scale of 0-10, with 10 being the most severe.  The patient reported difficulty falling asleep the previous night but stated that after receiving hydroxyzine and trazodone, she was able to fall asleep and remain asleep for the rest of the night.  She described her appetite as satisfactory, noting that she is eating 3 meals a day. She identified her primary stressor is trying to manage her medications and expressed anxiety about potential side effects, whether the medications are appropriate, and how they will affect her.  However, she denied experiencing  any side effects from her current medications.  The patient reported attending and actively participating in group sessions. She denied suicidal or homicidal ideation, as well as auditory or visual hallucinations, paranoia, or delusional thoughts. She inquired about medication for chronic fatigue syndrome and reported that she was informed about a medication yesterday.    During the interaction, the patient complemented this nurse practitioner on her nails and shared that she no longer gets her nails done because she plays the violin, which she has a played since the fifth grade.  Case staffed with attending psychiatrist, N.Massengill. Regarding medication for chronic fatigue syndrome the patient is advised to follow-up on an outpatient basis. Regarding medication changes, Latuda will be reduced from 40 mg to 20 mg for 1 dose at supper, then discontinued. Lamotrigine 20 mg will be initiated daily starting today. Trazodone 100 mg, previously prescribed as needed at bedtime, will be changed to a routine nightly dose. Continued hospitalization remains necessary at this time, to treat and stabilize mental status prior to discharge.     We discussed changes to the current medication regimen: - Latuda will be reduced from 40 mg to 20 mg for 1 dose at supper, then discontinued. - Lamotrigine 20 mg will be initiated daily starting today. -Trazodone 100 mg, previously prescribed as needed at bedtime, will be changed to a routine nightly dose.   Principal Problem: MDD (major depressive disorder), recurrent severe, without psychosis (HCC) Diagnosis: Principal Problem:   MDD (major depressive disorder), recurrent severe, without psychosis (HCC) Active Problems:   Insomnia   Migraines  OCD (obsessive compulsive disorder)   DM2 (diabetes mellitus, type 2) (HCC)   GAD (generalized anxiety disorder)  Total Time spent with patient: 45 minutes  Past Psychiatric History: See H&P  Past Medical History:   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    Sleep apnea    Thyroid disease     Past Surgical History:  Procedure Laterality Date   ABDOMINAL HYSTERECTOMY     ans     2001, 2010   ANS unit      CESAREAN SECTION     CESAREAN SECTION     2007   DILATION AND CURETTAGE OF UTERUS     2007   [facility]     ESOPHAGEAL MANOMETRY N/A 04/29/2015   Procedure: ESOPHAGEAL MANOMETRY (EM);  Surgeon: Willis Modena, MD;  Location: [facility];  Service: Endoscopy;  Laterality: N/A;   gallbladder removed     GALLBLADDER SURGERY     1998   SHOULDER SURGERY  1998   plate placed   stretching of esophagus     Tens unit     04/2014   Family History:  Family History  Adopted: Yes  Problem Relation Age of Onset   Depression Mother    Dementia Mother    Depression Maternal Grandmother    Diabetes Father    Family Psychiatric  History: See H&P Social History:  Social History   Substance and Sexual Activity  Alcohol Use No     Social History   Substance and Sexual Activity  Drug Use No    Social History   Socioeconomic History   Marital status: Married    Spouse name: Not on file   Number of children: 1   Years of education: college   Highest education level: Not on file  Occupational History   Occupation: disabled  Tobacco Use   Smoking status: Former    Current packs/day: 0.00    Average packs/day:  2.0 packs/day for 10.8 years (21.5 ttl pk-yrs)    Types: Cigarettes    Start date: 37    Quit date: 06/25/2000    Years since quitting: 23.3   Smokeless tobacco: Never  Vaping Use   Vaping status: Never Used  Substance and Sexual Activity   Alcohol use: No   Drug use: No   Sexual activity: Yes  Other Topics Concern   Not on file  Social History Narrative   Caffeine - patient is trying to stop drinking caffeine (she drinks 1 "huge" cup of caffeine daily)   Patient is right handed.   Social Drivers of Health   Financial Resource Strain: Medium Risk (03/15/2020)   [facility] from [facility] visits prior to 11/17/2022., [facility] visits prior to 11/17/2022.   Overall Financial  Resource Strain (CARDIA)    Difficulty of Paying Living Expenses: Somewhat hard  Food Insecurity: No Food Insecurity (10/14/2023)   Hunger Vital Sign    Worried About Running Out of Food in the Last Year: Never true    Ran Out of Food in the Last Year: Never true  Transportation Needs: No Transportation Needs (10/14/2023)   PRAPARE - Administrator, Civil Service (Medical): No    Lack of Transportation (Non-Medical): No  Physical Activity: Unknown (03/15/2020)   [facility] visits prior to 11/17/2022., [facility] visits prior to 11/17/2022.   Exercise Vital Sign    Days of Exercise per Week: 3 days    Minutes of Exercise per Session: Not on file  Stress: Stress Concern Present (03/15/2020)   [facility] prior to 11/17/2022., [facility] visits prior to 11/17/2022.   [employer] of Occupational Health - Occupational Stress Questionnaire    Feeling of Stress : Rather much  Social Connections: Socially Integrated (03/15/2020)   [facility] prior to 11/17/2022., [facility] visits  prior to 11/17/2022.   Social Advertising account executive [NHANES]    Frequency of Communication with Friends and Family: More than three times a week    Frequency of Social Gatherings with Friends and Family: Twice a week    Attends Religious Services: More than 4 times per year    Active Member of [employer] or Organizations: Yes    Attends Engineer, structural: More than 4 times per year    Marital Status: Married   Additional Social History:                         Current Medications: Current Facility-Administered Medications  Medication Dose Route Frequency Provider Last Rate Last Admin   acetaminophen (TYLENOL) tablet 650 mg  650 mg Oral Q6H PRN Starleen Blue, NP       acyclovir (ZOVIRAX) 200 MG capsule 400 mg  400 mg Oral QHS Massengill, Harrold Donath, MD   400 mg at 10/15/23 2223   albuterol (VENTOLIN HFA) 108 (90 Base) MCG/ACT inhaler 1 puff  1 puff Inhalation Q6H PRN Massengill, Harrold Donath, MD       alum & mag hydroxide-simeth (MAALOX/MYLANTA) 200-200-20 MG/5ML suspension 30 mL  30 mL Oral Q4H PRN Starleen Blue, NP       cloNIDine (CATAPRES) tablet 0.1 mg  0.1 mg Oral QHS Massengill, Harrold Donath, MD   0.1 mg at 10/15/23 2111   haloperidol (HALDOL) tablet 5 mg  5 mg Oral TID PRN Starleen Blue, NP       And   diphenhydrAMINE (BENADRYL) capsule 50 mg  50 mg Oral TID PRN Starleen Blue, NP       haloperidol lactate (HALDOL) injection 5 mg  5 mg Intramuscular TID PRN Starleen Blue, NP       And   diphenhydrAMINE (BENADRYL) injection 50 mg  50 mg Intramuscular TID PRN Starleen Blue, NP       And   LORazepam (ATIVAN) injection 2 mg  2 mg Intramuscular TID PRN Starleen Blue, NP       hydrOXYzine (ATARAX) tablet 25 mg  25 mg Oral TID PRN Phineas Inches, MD   25 mg at 10/15/23 2223   ipratropium (ATROVENT HFA) inhaler 2 puff  2 puff Inhalation TID PRN Massengill, Harrold Donath,  MD       isosorbide mononitrate (IMDUR) 24 hr tablet 30 mg  30 mg Oral Daily Massengill, Harrold Donath, MD   30 mg  at 10/16/23 4098   lamoTRIgine (LAMICTAL) tablet 25 mg  25 mg Oral Daily Bethann Qualley H, NP   25 mg at 10/16/23 1409   loratadine (CLARITIN) tablet 10 mg  10 mg Oral Daily Massengill, Harrold Donath, MD   10 mg at 10/16/23 1191   LORazepam (ATIVAN) tablet 0.5 mg  0.5 mg Oral Q8H Massengill, Nathan, MD   0.5 mg at 10/16/23 1409   lurasidone (LATUDA) tablet 20 mg  20 mg Oral Q supper Marcos Peloso H, NP       magnesium hydroxide (MILK OF MAGNESIA) suspension 30 mL  30 mL Oral Daily PRN Starleen Blue, NP       methocarbamol (ROBAXIN) tablet 500 mg  500 mg Oral Q8H Massengill, Nathan, MD   500 mg at 10/16/23 1409   minoxidil (LONITEN) tablet 2.5 mg  2.5 mg Oral BID Massengill, Harrold Donath, MD   2.5 mg at 10/16/23 1104   pravastatin (PRAVACHOL) tablet 10 mg  10 mg Oral q1800 Cherylin Mylar, RPH       pregabalin (LYRICA) capsule 150 mg  150 mg Oral Q8H Massengill, Nathan, MD   150 mg at 10/16/23 1409   traZODone (DESYREL) tablet 100 mg  100 mg Oral QHS Laniesha Das H, NP       venlafaxine XR (EFFEXOR-XR) 24 hr capsule 75 mg  75 mg Oral Q breakfast Massengill, Harrold Donath, MD   75 mg at 10/16/23 4782    Lab Results:  No results found for this or any previous visit (from the past 48 hours).   Blood Alcohol level:  No results found for: "ETH"  Metabolic Disorder Labs: Lab Results  Component Value Date   HGBA1C 4.9 10/14/2023   MPG 93.93 10/14/2023   No results found for: "PROLACTIN" Lab Results  Component Value Date   CHOL 178 10/14/2023   TRIG 187 (H) 10/14/2023   HDL 37 (L) 10/14/2023   CHOLHDL 4.8 10/14/2023   VLDL 37 10/14/2023   LDLCALC 104 (H) 10/14/2023    Physical Findings: AIMS:  , , 0 ,  ,    CIWA:   N/A COWS:   N/A  Musculoskeletal: Strength & Muscle Tone: within normal limits Gait & Station: normal Patient leans: N/A  Psychiatric Specialty Exam:  Presentation  General Appearance:  Fairly Groomed  Eye Contact: Fair  Speech: Clear and Coherent  Speech  Volume: Normal  Handedness: Right   Mood and Affect  Mood: Depressed; Anxious  Affect: Congruent   Thought Process  Thought Processes: Coherent  Descriptions of Associations:Intact  Orientation:Full (Time, Place and Person)  Thought Content:Logical  History of Schizophrenia/Schizoaffective disorder:No  Duration of Psychotic Symptoms:No data recorded Hallucinations:Hallucinations: None  Ideas of Reference:None  Suicidal Thoughts:Suicidal Thoughts: Yes, Passive SI Active Intent and/or Plan: Without Intent; Without Plan  Homicidal Thoughts:Homicidal Thoughts: No   Sensorium  Memory: Immediate Fair  Judgment: Fair  Insight: Fair   Chartered certified accountant: Fair  Attention Span: Fair  Recall: Fair  Fund of Knowledge: Fair  Language: Good   Psychomotor Activity  Psychomotor Activity: Psychomotor Activity: Normal   Assets  Assets: Communication Skills; Resilience; Social Support; Housing   Sleep  Sleep: Sleep: Poor    Physical Exam: Physical Exam Vitals and nursing note reviewed.  Constitutional:      General: She is not in acute distress.  Appearance: Normal appearance.  HENT:     Head: Atraumatic.     Mouth/Throat:     Mouth: Mucous membranes are dry.  Eyes:     Conjunctiva/sclera: Conjunctivae normal.  Cardiovascular:     Rate and Rhythm: Normal rate.     Pulses: Normal pulses.  Pulmonary:     Effort: No respiratory distress.  Musculoskeletal:        General: Normal range of motion.     Cervical back: Normal range of motion.  Skin:    General: Skin is dry.  Neurological:     General: No focal deficit present.     Mental Status: She is alert and oriented to person, place, and time.    Review of Systems  Constitutional:  Negative for diaphoresis, fever, malaise/fatigue and weight loss.  HENT:  Negative for congestion, hearing loss and sore throat.   Eyes:  Negative for discharge and redness.   Respiratory:  Negative for cough and shortness of breath.   Cardiovascular:  Negative for chest pain and palpitations.  Gastrointestinal:  Negative for constipation, diarrhea, nausea and vomiting.  Musculoskeletal:  Negative for falls.  Neurological:  Negative for dizziness, tingling, tremors and headaches.  Psychiatric/Behavioral:  Positive for depression. The patient is nervous/anxious and has insomnia.    Blood pressure 94/70, pulse 83, temperature 97.9 F (36.6 C), resp. rate 20, height 5\' 2"  (1.575 m), weight 96.4 kg, SpO2 99%. Body mass index is 38.89 kg/m.   Treatment Plan Summary: Daily contact with patient to assess and evaluate symptoms and progress in treatment and Medication management   Safety and Monitoring: Voluntary admission to inpatient psychiatric unit for safety, stabilization and treatment Daily contact with patient to assess and evaluate symptoms and progress in treatment Patient's case to be discussed in multi-disciplinary team meeting Observation Level : q15 minute checks Vital signs: q12 hours Precautions: Safety   Long Term Goal(s): Improvement in symptoms so as ready for discharge   Short Term Goals: Ability to identify changes in lifestyle to reduce recurrence of condition will improve, Ability to verbalize feelings will improve, Ability to disclose and discuss suicidal ideas, Ability to demonstrate self-control will improve, Ability to identify and develop effective coping behaviors will improve, Ability to maintain clinical measurements within normal limits will improve, and Compliance with prescribed medications will improve   Diagnoses Principal Problem:   MDD (major depressive disorder), recurrent severe, without psychosis (HCC) Active Problems:   Insomnia   Migraines   OCD (obsessive compulsive disorder)   DM2 (diabetes mellitus, type 2) (HCC)   GAD (generalized anxiety disorder)   Treatment Plan:  Bipolar d/o current episode depressed / GAD  / OCD  #1. Reduce latuda from 40 mg with dinner to 20 mg with dinner for one dose, then discontinue. #2. Start lamotrigine 20 mg, oral, daily, starting today. #3. Continue Effexor XR 75 mg oral, daily. #4. Continue to hold stimulants as clinically indicated. #5. Continue Ativan 0.5, oral, every 8 hours   #6. Continue clonidine 0.1 mg, oral, daily at bedtime.   Continue PRN medications as ordered -- hydroxyzine 25 mg oral, 3 times daily as needed, anxiety -- trazodone 50 mg, oral, daily at bedtime as needed, sleep  Continue mild/moderate agitation protocol as ordered --Haldol 5 mg, oral, 3 times daily as needed, mild agitation --Benadryl 50 mg, oral, 3 times daily as needed, mild agitation    OR --Haldol injection 5 mg, IM, 3 times daily as needed, moderate agitation --Benadryl injection 50 mg, IM,  3 times daily as needed, moderate agitation --Ativan injection 2 mg, IM, 3 times daily as needed, moderate    Asthma /COPD / Fibromyalgia / Hypercholesteremia / Hypertension / Rhinitis   #1. Continue acyclovir 400 mg, oral, daily at bedtime #2. Continue Claritin 10 mg, oral, daily #3. Continue Robaxin 500 mg, oral, every 8 hours #4. Continue Lyrica 150 mg, oral, every 8 hours. #5. Continue pravastatin 10 mg, oral, daily at 1800 #6.  Continue minoxidil 2.5 mg, oral, 2 times daily #7.  Continue Imdur 30 mg, oral, daily  Continue PRN medications as ordered -albuterol 108 MCG/ACT inhaler, 1 puff, inhalation, every 6 hours PRN, wheezing, shortness of breath -Atrovent HFA inhaler, inhalation, 3 times daily as needed, rhinitis -Tylenol 650 mg every 6 hours PRN for mild pain -Maalox 30 mg every 4 hrs PRN for indigestion -Milk of Magnesia as needed every 6 hrs for constipation   Labs Reviewed: positive for Benzos, TSH, lipid panel, Ha1c, CMP, CBC, UA & Upreg reviewed. EKG ordered.     Discharge Planning: Social work and case management to assist with discharge planning and identification  of hospital follow-up needs prior to discharge Estimated LOS: 5-7 days Discharge Concerns: Need to establish a safety plan; Medication compliance and effectiveness Discharge Goals: Return home with outpatient referrals for mental health follow-up including medication management/psychotherapy   I certify that inpatient services furnished can reasonably be expected to improve the patient's condition.      Norma Fredrickson, NP 10/16/2023, 3:26 PM

## 2023-10-16 NOTE — Group Note (Signed)
Recreation Therapy Group Note   Group Topic:Health and Wellness  Group Date: 10/16/2023 Start Time: 0930 End Time: 1000 Facilitators: Teo Moede-McCall, LRT,CTRS Location: [address]   Group Topic: Wellness  Goal Area(s) Addresses:  Patient will define components of whole wellness. Patient will verbalize benefit of whole wellness.  Intervention: Music  Activity: Exercise. Patients took turns leading the group in the exercises of their choosing. The group completed three rounds of stretching and exercise. Patients were encouraged to get water, take breaks and not to over extend themselves during the course of the activity. Emphasis was placed on patients paying attention to their bodies and what exercises they are able to complete.   Education: Wellness, Building control surveyor.   Education Outcome: Acknowledges education/In group clarification offered/Needs additional education.   Affect/Mood: Appropriate   Participation Level: Engaged   Participation Quality: Independent   Behavior: Appropriate   Speech/Thought Process: Focused   Insight: Good   Judgement: Good   Modes of Intervention: Music   Patient Response to Interventions:  Engaged   Education Outcome:  In group clarification offered    Clinical Observations/Individualized Feedback: Pt was bright and engaged. Pt interacted well with peers. Pt was able to complete the exercises presented during group session.     Plan: Continue to engage patient in RT group sessions 2-3x/week.   [name], LRT,CTRS 10/16/2023 12:21 PM

## 2023-10-16 NOTE — Group Note (Signed)
Date:  10/16/2023 Time:  4:14 PM  Group Topic/Focus:  Dimensions of Wellness:   The focus of this group is to introduce the topic of wellness and discuss the role each dimension of wellness plays in total health.    Participation Level:  Active  Participation Quality:  Appropriate  Affect:  Appropriate  Cognitive:  Appropriate  Insight: Appropriate  Engagement in Group:  Engaged  Modes of Intervention:  Activity and Education  Additional Comments:   Pt attended and participated in the Physical Wellness group. Pt was attentive as MHT provided education on Mindful Progressive Relaxation. Pt practiced the relaxation technique with the group.  Edmund Hilda Kayla Weekes 10/16/2023, 4:14 PM

## 2023-10-16 NOTE — Group Note (Signed)
Date:  10/16/2023 Time:  10:54 AM  Group Topic/Focus:  Goals Group:   The focus of this group is to help patients establish daily goals to achieve during treatment and discuss how the patient can incorporate goal setting into their daily lives to aide in recovery. Orientation:   The focus of this group is to educate the patient on the purpose and policies of crisis stabilization and provide a format to answer questions about their admission.  The group details unit policies and expectations of patients while admitted.    Participation Level:  Active  Participation Quality:  Appropriate  Affect:  Appropriate  Cognitive:  Appropriate  Insight: Appropriate  Engagement in Group:  Engaged  Modes of Intervention:  Activity, Orientation, and Rapport Building  Additional Comments:   Pt attended and actively participated in the Orientation and Goals group. Pt was attentive, calm and cooperative throughout the group. Pt completed the SMART goals activity in which she identified  medication compliance and education as a personal goal.  Stark Bray 10/16/2023, 10:54 AM

## 2023-10-16 NOTE — Progress Notes (Signed)
   10/16/23 0900  Psych Admission Type (Psych Patients Only)  Admission Status Voluntary  Psychosocial Assessment  Patient Complaints Anxiety;Depression  Eye Contact Fair  Facial Expression Flat  Affect Depressed;Sad  Speech Soft  Interaction Assertive  Motor Activity Slow  Appearance/Hygiene Unremarkable  Behavior Characteristics Cooperative  Mood Depressed;Anxious  Thought Process  Coherency WDL  Content WDL  Delusions None reported or observed  Perception WDL  Hallucination None reported or observed  Judgment WDL  Confusion None  Danger to Self  Current suicidal ideation? Denies  Self-Injurious Behavior No self-injurious ideation or behavior indicators observed or expressed   Agreement Not to Harm Self Yes  Description of Agreement verbal contract  Danger to Others  Danger to Others None reported or observed

## 2023-10-17 DIAGNOSIS — F332 Major depressive disorder, recurrent severe without psychotic features: Secondary | ICD-10-CM | POA: Diagnosis not present

## 2023-10-17 NOTE — Progress Notes (Signed)
   10/17/23 2000  Psych Admission Type (Psych Patients Only)  Admission Status Voluntary  Psychosocial Assessment  Patient Complaints Anxiety;Depression  Eye Contact Fair  Facial Expression Flat  Affect Sad;Depressed  Speech Soft  Interaction Assertive  Motor Activity Slow  Appearance/Hygiene Unremarkable  Behavior Characteristics Cooperative;Calm  Mood Anxious  Thought Process  Coherency WDL  Content WDL  Delusions None reported or observed  Perception WDL  Hallucination None reported or observed  Judgment WDL  Confusion None  Danger to Self  Current suicidal ideation? Denies  Self-Injurious Behavior No self-injurious ideation or behavior indicators observed or expressed   Agreement Not to Harm Self Yes  Description of Agreement verbal  Danger to Others  Danger to Others None reported or observed

## 2023-10-17 NOTE — Plan of Care (Signed)
Problem: Education: Goal: Emotional status will improve Outcome: Progressing Goal: Mental status will improve Outcome: Progressing

## 2023-10-17 NOTE — Group Note (Signed)
Date:  10/17/2023 Time:  9:22 PM  Group Topic/Focus:  Wrap-Up Group:   The focus of this group is to help patients review their daily goal of treatment and discuss progress on daily workbooks.    Participation Level:  Active  Participation Quality:  Appropriate and Sharing  Affect:  Appropriate  Cognitive:  Appropriate  Insight: Appropriate  Engagement in Group:  Engaged  Modes of Intervention:  Activity and Socialization  Additional Comments:  Patients used and completed wrap up group sheets during group. Patient rated her day a 8/10. Patient shared that her foal for today was "to get the info for my meds". Patient shared that she did achieve her goal. Patient shared coping skills that she finds most helpful is "the changes steps". Patient shared something that she likes about herself is "I do awesome diamond art". Patient patient participated in group activity after sharing.   Kennieth Francois 10/17/2023, 9:22 PM

## 2023-10-17 NOTE — [facility] Group Notes (Signed)
Adult Psychoeducational Group Note  Date:  10/17/2023 Time:  2:03 PM  Group Topic/Focus:  Dimensions of Wellness:   The focus of this group is to introduce the topic of wellness and discuss the role each dimension of wellness plays in total health.  Participation Level:  Active  Participation Quality:  Attentive  Affect:  Appropriate  Cognitive:  Alert  Insight: Appropriate  Engagement in Group:  Engaged  Modes of Intervention:  Activity  Additional Comments:  Patient attended and participated in the Emotional Wellness group activity.  Jearl Klinefelter 10/17/2023, 2:03 PM

## 2023-10-17 NOTE — Group Note (Signed)
LCSW Group Therapy Note   Group Date: 10/17/2023 Start Time: 1100 End Time: 1200   Type of Therapy:  Group Therapy  Topic:  Understanding Your Path to Change  Participation:  patient was present and actively participated in the discussion.   Objective:  The goal is to help participants understand the stages of change, identify where they currently are in the process, and provide actionable next steps to continue moving forward in their journey of change.  Goals: Learn about the six stages of change:  Precontemplation, Contemplation, Preparation, Action, Maintenance, and Relapse Reflect on Current Change Efforts:  Recognize which stage participants are in regarding to a personal change. Plan Next Steps for Moving Forward:  Create an action plan based on their current stage of change.  Class Summary: In this session, we explored the Stages of Change as a framework to understand the process of change.  We discussed how each stage helps individuals recognize where they are in their personal journey and used the Stages of Change Worksheet for self-reflection.  Participants answered questions to better understand their current stage, challenges, and progress. We also emphasized the importance of moving forward, even if setbacks (Relapse) occur, and created actionable steps to help participants continue progressing. By the end of the session, participants gained a clearer understanding of their path to change and left with a clear plan for next steps.  Therapeutic Modalities:  Elements of CBT (cognitive restructuring, problem solving)  Element of DBT (mindfulness, distress tolerance)   Muna Demers O Aniella Wandrey, LCSWA 10/17/2023  6:26 PM

## 2023-10-17 NOTE — Progress Notes (Signed)
Patient ID: [name], female   DOB: 11/03/1970, 53 y.o.   MRN: [id number] Met with Pt on the unit to discuss discharge planning. Ammara is interested in new psychiatry provider and this was further supported by the treatment team. She reports no preference. CSW made request to [facility] Assistant for referrals and Pt has an appointment with [facility]. ROI signed. Tenessa reports feeling well and has no concerns. She is anticipated to discharge over the weekend. Husband or father will be providing transportation.   Mckinley Jewel, LCSW 10/17/23

## 2023-10-17 NOTE — Progress Notes (Addendum)
[facility] MD Progress Note  10/17/2023 2:33 PM [name]  MRN:  [id number] Principal Problem: MDD (major depressive disorder), recurrent severe, without psychosis (HCC) Diagnosis: Principal Problem:   MDD (major depressive disorder), recurrent severe, without psychosis (HCC) Active Problems:   Insomnia   Migraines   OCD (obsessive compulsive disorder)   DM2 (diabetes mellitus, type 2) (HCC)   GAD (generalized anxiety disorder)  HPI: Patient is a 53 yo CF with prior reported mental health diagnoses of MDD, GAD, OCD, multiple personality d/o & PTSD who walked [place] the [place] [facility]) on 1/27 unaccompanied, with worsening suicidal ideations & SI with no plan. Pt reported inability to trust herself to keep self safe at home or in the community. She was transferred and admitted voluntarily to the [facility] on same day for treatment and stabilization of her mental status.   24 hr chart Review: Patient is remaining compliant with scheduled medications, no behavioral issues reported or noted in the past 24 hours.  As needed medication was hydroxyzine.  Patient observed to be attending unit group sessions, interacting appropriately with peers.  Vital signs are within normal limits.  Patient assessment note: During encounter today, mood is still depressed, but significantly improved as compared to time of admission.  Patient continues to report anxiety as being significantly elevated, rates anxiety as a 7, 10 being worst.  Patient reports that her sleep quality last night was good, she reports energy level today as being average, reports appetite as normal, denies suicidal ideation, denies homicidal ideation, denies auditory, visual, or tactile disturbances.  She denies paranoia, denies first rank symptoms, there is no evidence of delusional thoughts.  Patient shares that she has any symptoms and is unsure what is causing it, and reports urinary incontinence,  which as per description, sounds like incontinence related to urgency, states that in the past a urologist told her that Klonopin was responsible for it.  She states that she had this symptom before in the past, but has not had it for a while now.  We will defer this to urology, for patient to follow up after discharge.  She has been educated on this, and has verbalized understanding.  Urinalysis during this admission is negative for UTI.  Patient denies medication related side effects, denies being in any physical pain today, denies any issues with moving her bowels.  We continue to monitor effects of medications, and making adjustments as needed for treatment and stabilization of patient's mental status.  We are stopping Jordan today, and continuing all other medications as listed below.  Tentative discharge date for patient is 02/1, as long as safety planning is completed with her family, and all discharge follow-up appointments completed.  Total Time spent with patient: 45 minutes  Past Psychiatric History: See H & P  Past Medical History:  Past Medical History:  Diagnosis Date   Anxiety    Asthma    Bell's palsy    Bipolar disorder (HCC)    Cancer (HCC)    skin   Chronic headache disorder 03/08/2015   Collapsed lung    right lung   COPD (chronic obstructive pulmonary disease) (HCC)    Depression    Diabetes mellitus without complication (HCC)    Dislocated shoulder    Dyslipidemia    Essential hypertension 02/20/2018   Fatigue    Fibromyalgia    GERD (gastroesophageal reflux disease)    Hypercholesteremia    Migraine    Nutcracker  esophagus    Obsessive-compulsive disorder    OSA (obstructive sleep apnea)    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    Sleep apnea    Thyroid disease     Past Surgical History:  Procedure Laterality Date   ABDOMINAL HYSTERECTOMY     ans     2001, 2010   ANS unit      CESAREAN SECTION     CESAREAN SECTION     2007   DILATION AND CURETTAGE OF  UTERUS     2007   [facility]     ESOPHAGEAL MANOMETRY N/A 04/29/2015   Procedure: ESOPHAGEAL MANOMETRY (EM);  Surgeon: Willis Modena, MD;  Location: [facility];  Service: Endoscopy;  Laterality: N/A;   gallbladder removed     GALLBLADDER SURGERY     1998   SHOULDER SURGERY  1998   plate placed   stretching of esophagus     Tens unit     04/2014   Family History:  Family History  Adopted: Yes  Problem Relation Age of Onset   Depression Mother    Dementia Mother    Depression Maternal Grandmother    Diabetes Father    Family Psychiatric  History: See H & P Social History:  Social History   Substance and Sexual Activity  Alcohol Use No     Social History   Substance and Sexual Activity  Drug Use No    Social History   Socioeconomic History   Marital status: Married    Spouse name: Not on file   Number of children: 1   Years of education: college   Highest education level: Not on file  Occupational History   Occupation: disabled  Tobacco Use   Smoking status: Former    Current packs/day: 0.00    Average packs/day: 2.0 packs/day for 10.8 years (21.5 ttl pk-yrs)    Types: Cigarettes    Start date: 69    Quit date: 06/25/2000    Years since quitting: 23.3   Smokeless tobacco: Never  Vaping Use   Vaping status: Never Used  Substance and Sexual Activity   Alcohol use: No   Drug use: No   Sexual activity: Yes  Other Topics Concern   Not on file  Social History Narrative   Caffeine - patient is trying to stop drinking caffeine (she drinks 1 "huge" cup of caffeine daily)   Patient is right handed.   Social Drivers of Health   Financial Resource Strain: Medium Risk (03/15/2020)   [facility] visits prior to 11/17/2022., [facility] visits prior to 11/17/2022.   Overall Financial Resource Strain (CARDIA)    Difficulty of Paying Living Expenses: Somewhat hard  Food Insecurity: No Food Insecurity (10/14/2023)    Hunger Vital Sign    Worried About Running Out of Food in the Last Year: Never true    Ran Out of Food in the Last Year: Never true  Transportation Needs: No Transportation Needs (10/14/2023)   PRAPARE - Administrator, Civil Service (Medical): No    Lack of Transportation (Non-Medical): No  Physical Activity: Unknown (03/15/2020)   [facility] visits prior to 11/17/2022., [facility] visits prior to 11/17/2022.   Exercise Vital Sign    Days of Exercise per Week: 3 days    Minutes of Exercise per Session: Not on file  Stress: Stress Concern Present (03/15/2020)   [facility]  [facility] visits prior to 11/17/2022., [facility] visits prior to 11/17/2022.   [employer] of Occupational Health - Occupational Stress Questionnaire    Feeling of Stress : Rather much  Social Connections: Socially Integrated (03/15/2020)   [facility] prior to 11/17/2022., [facility] visits prior to 11/17/2022.   Social Advertising account executive [NHANES]    Frequency of Communication with Friends and Family: More than three times a week    Frequency of Social Gatherings with Friends and Family: Twice a week    Attends Religious Services: More than 4 times per year    Active Member of [employer] or Organizations: Yes    Attends Engineer, structural: More than 4 times per year    Marital Status: Married    Sleep: Good  Appetite:  Good  Current Medications: Current Facility-Administered Medications  Medication Dose Route Frequency Provider Last Rate Last Admin   acetaminophen (TYLENOL) tablet 650 mg  650 mg Oral Q6H PRN Starleen Blue, NP       acyclovir (ZOVIRAX) 200 MG capsule 400 mg  400 mg Oral QHS Massengill, Harrold Donath, MD   400 mg at 10/16/23 2108   albuterol (VENTOLIN HFA) 108 (90 Base) MCG/ACT inhaler 1 puff  1 puff Inhalation  Q6H PRN Massengill, Harrold Donath, MD       alum & mag hydroxide-simeth (MAALOX/MYLANTA) 200-200-20 MG/5ML suspension 30 mL  30 mL Oral Q4H PRN Starleen Blue, NP       cloNIDine (CATAPRES) tablet 0.1 mg  0.1 mg Oral QHS Massengill, Harrold Donath, MD   0.1 mg at 10/16/23 2108   haloperidol (HALDOL) tablet 5 mg  5 mg Oral TID PRN Starleen Blue, NP       And   diphenhydrAMINE (BENADRYL) capsule 50 mg  50 mg Oral TID PRN Starleen Blue, NP       haloperidol lactate (HALDOL) injection 5 mg  5 mg Intramuscular TID PRN Starleen Blue, NP       And   diphenhydrAMINE (BENADRYL) injection 50 mg  50 mg Intramuscular TID PRN Starleen Blue, NP       And   LORazepam (ATIVAN) injection 2 mg  2 mg Intramuscular TID PRN Starleen Blue, NP       hydrOXYzine (ATARAX) tablet 25 mg  25 mg Oral TID PRN Phineas Inches, MD   25 mg at 10/17/23 1024   ipratropium (ATROVENT HFA) inhaler 2 puff  2 puff Inhalation TID PRN Massengill, Harrold Donath, MD       isosorbide mononitrate (IMDUR) 24 hr tablet 30 mg  30 mg Oral Daily Massengill, Nathan, MD   30 mg at 10/17/23 0759   lamoTRIgine (LAMICTAL) tablet 25 mg  25 mg Oral Daily Bennett, Christal H, NP   25 mg at 10/17/23 0759   loratadine (CLARITIN) tablet 10 mg  10 mg Oral Daily Massengill, Harrold Donath, MD   10 mg at 10/17/23 0759   LORazepam (ATIVAN) tablet 0.5 mg  0.5 mg Oral Q8H Massengill, Nathan, MD   0.5 mg at 10/17/23 1411   magnesium hydroxide (MILK OF MAGNESIA) suspension 30 mL  30 mL Oral Daily PRN Starleen Blue, NP       methocarbamol (ROBAXIN) tablet 500 mg  500 mg Oral Q8H Massengill, Nathan, MD   500 mg at 10/17/23 1412   minoxidil (LONITEN) tablet 2.5 mg  2.5 mg Oral BID Massengill, Harrold Donath, MD   2.5 mg at 10/17/23  0759   pravastatin (PRAVACHOL) tablet 10 mg  10 mg Oral q1800 Cherylin Mylar, RPH   10 mg at 10/16/23 1657   pregabalin (LYRICA) capsule 150 mg  150 mg Oral Q8H Massengill, Nathan, MD   150 mg at 10/17/23 1411   traZODone (DESYREL) tablet 100 mg  100 mg Oral QHS  Bennett, Christal H, NP   100 mg at 10/16/23 2108   venlafaxine XR (EFFEXOR-XR) 24 hr capsule 75 mg  75 mg Oral Q breakfast Massengill, Harrold Donath, MD   75 mg at 10/17/23 5621    Lab Results: No results found for this or any previous visit (from the past 48 hours).  Blood Alcohol level:  No results found for: "ETH"  Metabolic Disorder Labs: Lab Results  Component Value Date   HGBA1C 4.9 10/14/2023   MPG 93.93 10/14/2023   No results found for: "PROLACTIN" Lab Results  Component Value Date   CHOL 178 10/14/2023   TRIG 187 (H) 10/14/2023   HDL 37 (L) 10/14/2023   CHOLHDL 4.8 10/14/2023   VLDL 37 10/14/2023   LDLCALC 104 (H) 10/14/2023    Physical Findings: AIMS:  , ,  ,  ,    CIWA:    COWS:     Musculoskeletal: Strength & Muscle Tone: within normal limits Gait & Station: normal Patient leans: N/A  Psychiatric Specialty Exam:  Presentation  General Appearance:  Appropriate for Environment; Fairly Groomed  Eye Contact: Good  Speech: Clear and Coherent  Speech Volume: Normal  Handedness: Right   Mood and Affect  Mood: Anxious; Depressed  Affect: Congruent   Thought Process  Thought Processes: Coherent  Descriptions of Associations:Intact  Orientation:Full (Time, Place and Person)  Thought Content:Logical  History of Schizophrenia/Schizoaffective disorder:No  Duration of Psychotic Symptoms:No data recorded Hallucinations:Hallucinations: None  Ideas of Reference:None  Suicidal Thoughts:Suicidal Thoughts: No  Homicidal Thoughts:Homicidal Thoughts: No   Sensorium  Memory: Immediate Fair  Judgment: Fair  Insight: Good   Executive Functions  Concentration: Fair  Attention Span: Good  Recall: Fair  Fund of Knowledge: Fair  Language: Good   Psychomotor Activity  Psychomotor Activity:Psychomotor Activity: Normal   Assets  Assets: Social Support   Sleep  Sleep:Sleep: Good    Physical Exam: Physical  Exam Vitals and nursing note reviewed.  Psychiatric:        Behavior: Behavior normal.        Thought Content: Thought content normal.    Review of Systems  Psychiatric/Behavioral:  Positive for depression. Negative for hallucinations, memory loss, substance abuse and suicidal ideas. The patient is nervous/anxious and has insomnia.   All other systems reviewed and are negative.  Blood pressure 108/82, pulse 92, temperature (!) 97.1 F (36.2 C), temperature source Oral, resp. rate 18, height 5\' 2"  (1.575 m), weight 96.4 kg, SpO2 97%. Body mass index is 38.89 kg/m.  Treatment Plan Summary: Daily contact with patient to assess and evaluate symptoms and progress in treatment and Medication management  Safety and Monitoring: Voluntary admission to inpatient psychiatric unit for safety, stabilization and treatment Daily contact with patient to assess and evaluate symptoms and progress in treatment Patient's case to be discussed in multi-disciplinary team meeting Observation Level : q15 minute checks Vital signs: q12 hours Precautions: Safety  Long Term Goal(s): Improvement in symptoms so as ready for discharge  Short Term Goals: Ability to identify changes in lifestyle to reduce recurrence of condition will improve, Ability to verbalize feelings will improve, Ability to disclose and discuss suicidal ideas, Ability  to demonstrate self-control will improve, Ability to identify and develop effective coping behaviors will improve, Ability to maintain clinical measurements within normal limits will improve, and Compliance with prescribed medications will improve  Diagnoses Principal Problem:   MDD (major depressive disorder), recurrent severe, without psychosis (HCC) Active Problems:   Insomnia   Migraines   OCD (obsessive compulsive disorder)   DM2 (diabetes mellitus, type 2) (HCC)   GAD (generalized anxiety disorder)  Medications -Stop Latuda completely today 1/30 as there is no  indication for this medication, patient is not psychotic. -Continue trazodone 100 mg nightly for sleep -Continue Effexor XR 75 mg daily for depressive symptoms -Continue Lamictal 25 mg daily for mood stabilization/migraines -Continue clonidine 0.1 mg nightly at bedtime for nightmares -Continue Ativan 0.5 mg every 8 hours for anxiety  Medications for medical reasons -Continue acyclovir 400 mg nightly at bedtime for herpes simplex virus -Continue Imdur 30 mg daily for hypertension -Continue Robaxin 500 mg every 8 hours as needed for muscle pain -Continue Minoxidil 2.5 mg BID -Continue pravastatin 10 mg daily for hypercholesterolemia -Continue Lyrica 150 mg every 8 hours for chronic pain  PRNS -Continue agitation protocol medications as per the [facility] (Haldol/Benadryl/Ativan 3 times daily as needed)-please see MAR -Continue hydroxyzine 25 mg 3 times daily as needed for anxiety -Continue albuterol inhaler every 6 hours as needed for shortness of breath or wheezing -Continue Atrovent 3 times daily as needed for rhinitis -Continue Tylenol 650 mg every 6 hours PRN for mild pain -Continue Maalox 30 mg every 4 hrs PRN for indigestion -Continue Milk of Magnesia as needed every 6 hrs for constipation  Labs reviewed: Cholesterol is 187, HDL 37, educated on healthy food choices and exercise, verbalizes understanding.  No new orders placed.  Discharge Planning: Social work and case management to assist with discharge planning and identification of hospital follow-up needs prior to discharge Estimated LOS: 5-7 days Discharge Concerns: Need to establish a safety plan; Medication compliance and effectiveness Discharge Goals: Return home with outpatient referrals for mental health follow-up including medication management/psychotherapy  I certify that inpatient services furnished can reasonably be expected to improve the patient's condition.    Starleen Blue, NP 1/30/20252:33 PM

## 2023-10-17 NOTE — Progress Notes (Addendum)
D. Pt has been friendly during interactions- visible in the milieu, observed attending groups and interacting well with peers. Per pt's self inventory, pt rated her depression,hopelessness and anxiety a 5/2/7, respectively.  Pt reported sleeping well last night, described her appetite and concentration as 'good', and energy level as 'low'. [name] stated goal today is "getting to know my meds." Pt currently denies SI/HI and AVH  A. Labs and vitals monitored. Pt given and educated on medications. Pt supported emotionally and encouraged to express concerns and ask questions.   R. Pt remains safe with 15 minute checks. Will continue POC.    10/17/23 1000  Psych Admission Type (Psych Patients Only)  Admission Status Voluntary  Psychosocial Assessment  Patient Complaints Anxiety  Eye Contact Fair  Facial Expression Flat  Affect Depressed  Speech Soft  Interaction Assertive  Motor Activity Slow  Appearance/Hygiene Unremarkable  Behavior Characteristics Calm;Cooperative  Mood Anxious  Thought Process  Coherency WDL  Content WDL  Delusions None reported or observed  Perception WDL  Hallucination None reported or observed  Judgment WDL  Confusion None  Danger to Self  Current suicidal ideation? Denies  Self-Injurious Behavior No self-injurious ideation or behavior indicators observed or expressed   Agreement Not to Harm Self Yes  Description of Agreement agreed to contact staff before acting on harmful thoughts  Danger to Others  Danger to Others None reported or observed

## 2023-10-17 NOTE — Plan of Care (Signed)

## 2023-10-18 DIAGNOSIS — F332 Major depressive disorder, recurrent severe without psychotic features: Secondary | ICD-10-CM | POA: Diagnosis not present

## 2023-10-18 NOTE — BHH Group Notes (Signed)
Spiritual care group facilitated by Chaplain Dyanne Carrel, [facility]  Group focused on topic of strength. Group members reflected on what thoughts and feelings emerge when they hear this topic. They then engaged in facilitated dialog around how strength is present in their lives. This dialog focused on representing what strength had been to them in their lives (images and patterns given) and what they saw as helpful in their life now (what they needed / wanted).  Activity drew on narrative framework.  Patient Progress: [name] attended group and actively engaged and participated in group conversation and activities.

## 2023-10-18 NOTE — Plan of Care (Signed)

## 2023-10-18 NOTE — Group Note (Signed)
Date:  10/18/2023 Time:  1:35 PM  Group Topic/Focus:  Goals Group:   The focus of this group is to help patients establish daily goals to achieve during treatment and discuss how the patient can incorporate goal setting into their daily lives to aide in recovery. Orientation:   The focus of this group is to educate the patient on the purpose and policies of crisis stabilization and provide a format to answer questions about their admission.  The group details unit policies and expectations of patients while admitted.    Participation Level:  Active  Participation Quality:  Appropriate  Affect:  Appropriate  Cognitive:  Appropriate  Insight: Appropriate  Engagement in Group:  Engaged  Modes of Intervention:  Orientation and Rapport Building  Additional Comments:   Pt attended and participated in the Orientation and Goals group.Pt contributed to discussion and was supportive of peers. Pt personal goal for today is to improve self-care by showering daily.   Edmund Hilda Danny Zimny 10/18/2023, 1:35 PM

## 2023-10-18 NOTE — Plan of Care (Signed)
  Problem: Activity: Goal: Interest or engagement in activities will improve Outcome: Progressing Goal: Sleeping patterns will improve Outcome: Progressing   Problem: Coping: Goal: Ability to verbalize frustrations and anger appropriately will improve Outcome: Progressing Goal: Ability to demonstrate self-control will improve Outcome: Progressing   Problem: Physical Regulation: Goal: Ability to maintain clinical measurements within normal limits will improve Outcome: Progressing   Problem: Safety: Goal: Periods of time without injury will increase Outcome: Progressing

## 2023-10-18 NOTE — Progress Notes (Signed)
Patient rated her depression level 1/10 and her anxiety level 5/10 with 10 being the highest and 0 none. Pt identified her gaol for today as," Getting to all groups". Medication and group compliant. Vistaril 25 mg po administered for complaints of anxiety with effective results. Pt observed interacting well with peers. Appetite good on shift. Safety maintained.  10/18/23 0900  Psych Admission Type (Psych Patients Only)  Admission Status Voluntary  Psychosocial Assessment  Patient Complaints Anxiety;Disorientation  Eye Contact Fair  Facial Expression Flat  Affect Depressed;Anxious  Speech Soft  Interaction Assertive  Motor Activity Slow  Appearance/Hygiene Unremarkable  Behavior Characteristics Cooperative;Appropriate to situation  Mood Anxious;Depressed  Thought Process  Coherency WDL  Content WDL  Delusions None reported or observed  Perception WDL  Hallucination None reported or observed  Judgment WDL  Confusion None  Danger to Self  Current suicidal ideation? Denies  Self-Injurious Behavior No self-injurious ideation or behavior indicators observed or expressed   Agreement Not to Harm Self Yes  Description of Agreement Verbal  Danger to Others  Danger to Others None reported or observed
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
## 2023-10-18 NOTE — Progress Notes (Signed)
   10/18/23 2112  Psych Admission Type (Psych Patients Only)  Admission Status Voluntary  Psychosocial Assessment  Patient Complaints Anxiety  Eye Contact Fair  Facial Expression Flat  Affect Depressed  Speech Logical/coherent;Soft  Interaction Assertive  Motor Activity Slow  Appearance/Hygiene Unremarkable  Behavior Characteristics Cooperative  Mood Depressed;Anxious  Thought Process  Coherency WDL  Content WDL  Delusions None reported or observed  Perception WDL  Hallucination None reported or observed  Judgment WDL  Confusion None  Danger to Self  Current suicidal ideation? Denies  Self-Injurious Behavior No self-injurious ideation or behavior indicators observed or expressed   Agreement Not to Harm Self Yes  Description of Agreement verbal  Danger to Others  Danger to Others None reported or observed

## 2023-10-18 NOTE — [facility] Group Notes (Deleted)
[facility] Group Notes:  (Nursing/MHT/Case Management/Adjunct)  Date:  10/18/2023  Time:  8:33 PM  Type of Therapy:   AA Group  Participation Level:  Did Not Attend  Participation Quality:    Affect:    Cognitive:    Insight:    Engagement in Group:    Modes of Intervention:    Summary of Progress/Problems: Pt refused to attend group.  [name] 10/18/2023, 8:33 PM

## 2023-10-18 NOTE — Group Note (Signed)
Recreation Therapy Group Note   Group Topic:Problem Solving  Group Date: 10/18/2023 Start Time: 0930 End Time: 1015 Facilitators: Darrien Laakso-McCall, LRT,CTRS Location: [address] Dayroom   Group Topic: Problem Solving  Goal Area(s) Addresses:  Patient will effectively work in a team with other group members. Patient will verbalize importance of using appropriate problem solving techniques.  Patient will identify positive change associated with effective problem solving skills.   Intervention: Worksheets, Music  Activity: Patients were given two sheets of brain teasers. Patients were to work through each puzzle to come up with the answer. Patients had the option of working together or individually.    Education: Problem solving, Use of skills post discharge  Education Outcome: Acknowledges understanding/In group clarification offered/Needs additional education.    Affect/Mood: Appropriate   Participation Level: Engaged   Participation Quality: Independent   Behavior: Appropriate   Speech/Thought Process: Focused   Insight: Good   Judgement: Good   Modes of Intervention: Music and Worksheet   Patient Response to Interventions:  Engaged   Education Outcome:  In group clarification offered    Clinical Observations/Individualized Feedback: Pt was bright and energetic during group. Pt was social and focused trying to complete the teasers. Pt was appropriate and engaged throughout group.     Plan: Continue to engage patient in RT group sessions 2-3x/week.   [name], LRT,CTRS 10/18/2023 11:30 AM

## 2023-10-18 NOTE — [facility] Group Notes (Signed)
[facility] Group Notes:  (Nursing/MHT/Case Management/Adjunct)  Date:  10/18/2023  Time:  8:38 PM  Type of Therapy:   AA Group  Participation Level:  Active  Participation Quality:  Appropriate  Affect:  Appropriate  Cognitive:  Appropriate  Insight:  Appropriate  Engagement in Group:  Engaged  Modes of Intervention:  Education  Summary of Progress/Problems: Pt attended [employer].  Noah Delaine 10/18/2023, 8:38 PM

## 2023-10-18 NOTE — Plan of Care (Signed)
   Problem: Safety: Goal: Periods of time without injury will increase Outcome: Progressing

## 2023-10-19 DIAGNOSIS — F332 Major depressive disorder, recurrent severe without psychotic features: Secondary | ICD-10-CM | POA: Diagnosis not present

## 2023-10-19 MED ORDER — ISOSORBIDE MONONITRATE ER 30 MG PO TB24: 30.0000 mg | ORAL_TABLET | Freq: Every day | ORAL | 0 refills | Status: AC

## 2023-10-19 MED ORDER — PREGABALIN 150 MG PO CAPS: 150.0000 mg | ORAL_CAPSULE | Freq: Three times a day (TID) | ORAL | 0 refills | Status: AC

## 2023-10-19 MED ORDER — ACETAMINOPHEN 325 MG PO TABS
ORAL_TABLET | ORAL | Status: AC
  Filled 2023-10-19: qty 2

## 2023-10-19 MED ORDER — ACYCLOVIR 200 MG PO CAPS: 400.0000 mg | ORAL_CAPSULE | Freq: Every day | ORAL | 0 refills | Status: AC

## 2023-10-19 MED ORDER — TRAZODONE HCL 100 MG PO TABS: 100.0000 mg | ORAL_TABLET | Freq: Every day | ORAL | 0 refills | Status: AC

## 2023-10-19 MED ORDER — CLONIDINE HCL 0.1 MG PO TABS: 0.1000 mg | ORAL_TABLET | Freq: Every day | ORAL | 0 refills | Status: AC

## 2023-10-19 MED ORDER — PRAVASTATIN SODIUM 10 MG PO TABS: 10.0000 mg | ORAL_TABLET | Freq: Every day | ORAL | 0 refills | Status: AC

## 2023-10-19 MED ORDER — LORAZEPAM 0.5 MG PO TABS: 0.5000 mg | ORAL_TABLET | Freq: Three times a day (TID) | ORAL | 0 refills | Status: AC

## 2023-10-19 MED ORDER — MINOXIDIL 2.5 MG PO TABS: 2.5000 mg | ORAL_TABLET | Freq: Two times a day (BID) | ORAL | 0 refills | Status: AC

## 2023-10-19 MED ORDER — ACETAMINOPHEN 325 MG PO TABS
650.0000 mg | ORAL_TABLET | Freq: Four times a day (QID) | ORAL | Status: DC | PRN
  Administered 2023-10-19: 650 mg via ORAL

## 2023-10-19 MED ORDER — METHOCARBAMOL 500 MG PO TABS: 500.0000 mg | ORAL_TABLET | Freq: Three times a day (TID) | ORAL | 0 refills | Status: AC

## 2023-10-19 MED ORDER — HYDROXYZINE HCL 25 MG PO TABS: 25.0000 mg | ORAL_TABLET | Freq: Three times a day (TID) | ORAL | 0 refills | Status: AC | PRN

## 2023-10-19 MED ORDER — VENLAFAXINE HCL ER 75 MG PO CP24: 75.0000 mg | ORAL_CAPSULE | Freq: Every day | ORAL | 0 refills | Status: AC

## 2023-10-19 MED ORDER — LAMOTRIGINE 25 MG PO TABS: 25.0000 mg | ORAL_TABLET | Freq: Every day | ORAL | 0 refills | Status: AC

## 2023-10-19 NOTE — Discharge Summary (Signed)
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 we recommend that your outpatient psychiatrist follow routine screening for side effects within 3 months of discharge, including monitoring: AIMS scale, height, weight, blood pressure, fasting lipid panel, HbA1c, and fasting blood sugar.   -Recommend total abstinence  from alcohol, tobacco, and other illicit drug use at discharge.   -If your psychiatric symptoms recur, worsen, or if you have side effects to your psychiatric medications, call your outpatient psychiatric provider, 911, 988 or go to the nearest emergency department.  -If suicidal thoughts occur, immediately call your outpatient psychiatric provider, 911, 988 or go to the nearest emergency department.   Signed: Phineas Inches, MD 10/19/2023, 6:56 AM   Total Time Spent in Direct Patient Care:  I personally spent 35 minutes on the unit in direct patient care. The direct patient care time included face-to-face time with the patient, reviewing the patient's chart, communicating with other professionals, and coordinating care. Greater than 50% of this time was spent in counseling or coordinating care with the patient regarding goals of hospitalization, psycho-education, and discharge planning needs.   Phineas Inches, MD Psychiatrist

## 2023-10-19 NOTE — Discharge Instructions (Addendum)
-  Follow-up with your outpatient psychiatric provider -instructions on appointment date, time, and address (location) are provided to you in discharge paperwork. Could trial modafinil for chronic fatigue. Recommend not restarting concerta or ritalin.   -Take your psychiatric medications as prescribed at discharge - instructions are provided to you in the discharge paperwork  -Follow-up with outpatient primary care doctor and other specialists -for management of preventative medicine and any chronic medical disease.  -Recommend abstinence from alcohol, tobacco, and other illicit drug use at discharge.   -If your psychiatric symptoms recur, worsen, or if you have side effects to your psychiatric medications, call your outpatient psychiatric provider, 911, 988 or go to the nearest emergency department.  -If suicidal thoughts occur, call your outpatient psychiatric provider, 911, 988 or go to the nearest emergency department.

## 2023-10-19 NOTE — Progress Notes (Signed)
Patient discharged to home accompanied by family members. Discharge instructions, prescriptions, all required discharge documents and information about follow-up appointment given to pt with verbalization of understanding. All personal belongings returned to pt at time of discharge. Plan of Care and Education resolved. Patient escorted to lobby by RN at 1105.  10/19/23 0908  Psych Admission Type (Psych Patients Only)  Admission Status Voluntary  Psychosocial Assessment  Patient Complaints Anxiety  Eye Contact Fair  Facial Expression Animated  Affect Anxious  Speech Logical/coherent  Interaction Assertive  Motor Activity Slow  Appearance/Hygiene Unremarkable  Behavior Characteristics Cooperative  Mood Anxious  Thought Process  Coherency WDL  Content WDL  Delusions None reported or observed  Perception WDL  Hallucination None reported or observed  Judgment WDL  Confusion None  Danger to Self  Current suicidal ideation? Denies  Self-Injurious Behavior No self-injurious ideation or behavior indicators observed or expressed   Agreement Not to Harm Self Yes  Description of Agreement Verbal  Danger to Others  Danger to Others None reported or observed

## 2023-10-19 NOTE — Plan of Care (Signed)
  Problem: Education: Goal: Mental status will improve Outcome: Progressing   Problem: Activity: Goal: Interest or engagement in activities will improve Outcome: Progressing Goal: Sleeping patterns will improve Outcome: Progressing   Problem: Coping: Goal: Ability to verbalize frustrations and anger appropriately will improve Outcome: Progressing Goal: Ability to demonstrate self-control will improve Outcome: Progressing   Problem: Health Behavior/Discharge Planning: Goal: Identification of resources available to assist in meeting health care needs will improve Outcome: Progressing Goal: Compliance with treatment plan for underlying cause of condition will improve Outcome: Progressing   Problem: Safety: Goal: Periods of time without injury will increase Outcome: Progressing

## 2023-10-19 NOTE — Group Note (Signed)
Date:  10/19/2023 Time:  9:42 AM  Group Topic/Focus:  Goals Group:   The focus of this group is to help patients establish daily goals to achieve during treatment and discuss how the patient can incorporate goal setting into their daily lives to aide in recovery. Orientation:   The focus of this group is to educate the patient on the purpose and policies of crisis stabilization and provide a format to answer questions about their admission.  The group details unit policies and expectations of patients while admitted.    Participation Level:  Active  Deforest Hoyles Dali Kraner 10/19/2023, 9:42 AM

## 2023-10-19 NOTE — Progress Notes (Signed)
  [facility] Adult Case Management Discharge Plan :  Will you be returning to the same living situation after discharge:  Yes, returning with dad, husband and children At discharge, do you have transportation home?: Yes,  dad scheduled to p/u pt at 11am Do you have the ability to pay for your medications: yes [place] [employer] / [employer] PPO   Release of information consent forms completed and in the chart;  Patient's signature needed at discharge.  Patient to Follow up at:  Follow-up Information     [facility]. Schedule an appointment as soon as possible for a visit on 11/29/2023.   Specialty: Behavioral Health Why: You have a medication management services appointment on 11/29/23 at 12:10 am, in person. Contact information: [address] [place] [address] [phone]         [facility]. Go on 10/28/2023.   Why: You have an appointment for therapy services on 10/28/23 at 10:00 am, in person. Contact information: [address] [place] [address] [phone]         [facility] Go on 11/07/2023.   Why: You have an appointment for medication management services on 11/07/23 at 10:00 am.   The appointment will be held in person. Contact information: [address] [place] [address] [phone]                 Next level of care provider has access to [facility]:no  Safety Planning and Suicide Prevention discussed: Yes,        Has patient been referred to the Quitline?: Patient does not use tobacco/nicotine products  Patient has been referred for addiction treatment: No known substance use disorder.  Steffanie Dunn, LCSWA 10/19/2023, 9:03 AM

## 2023-10-19 NOTE — Progress Notes (Signed)
   10/19/23 0600  15 Minute Checks  Location Nurses Station  Visual Appearance Calm  Behavior Composed  Sleep (Behavioral Health Patients Only)  Calculate sleep? (Click Yes once per 24 hr at 0600 safety check) Yes  Documented sleep last 24 hours 6.25

## 2023-10-19 NOTE — BHH Suicide Risk Assessment (Signed)
[facility] Discharge Suicide Risk Assessment   Principal Problem: Bipolar depression [facility]) Discharge Diagnoses: Principal Problem:   Bipolar depression (HCC) Active Problems:   Insomnia   Migraines   OCD (obsessive compulsive disorder)   DM2 (diabetes mellitus, type 2) (HCC)   GAD (generalized anxiety disorder)   Total Time spent with patient: 20 minutes  Patient is a 53 yo CF with prior reported mental health diagnoses of MDD vs Bipolar disorder, GAD, OCD, multiple personality d/o & PTSD who walked into the [facility] urgent care center [facility]) on 1/27 unaccompanied, with worsening suicidal ideations & SI with no plan. Pt reported inability to trust herself to keep self safe at home or in the community. She was transferred and admitted voluntarily to the [facility] on same day for treatment and stabilization of her mental status.   During the patient's hospitalization, patient had extensive initial psychiatric evaluation, and follow-up psychiatric evaluations every day.   Psychiatric diagnoses provided upon initial assessment:  Bipolar depression GAD OCD Insomnia    Patient's psychiatric medications were adjusted on admission:   1. reduce latuda from 80 mg with dinner to 40 mg with dinner. consider starting lamotrigine to replace latuda for bipolar depression. 2. stop celexa. 3. start effexor 37.5 mg every day, and incr to 75 mg every day tomorrow. 4. hold stimulants. 5. change ativan (taken 1 mg at 8am and 12pm) to 0.5 q8H. 6. continue lyrica. 7. conitnue clonidine 01. mg at bedtime for now    During the hospitalization, other adjustments were made to the patient's psychiatric medication regimen:  -latuda was tapered off and stopped -lamotrigine was started at 25 mg every day -effexor was increased to 75 mg every day  -ativan was continued at 0.5 mg tid    After discharge, I recommend not restarting a stimulant, I do not believe the pt has adhd. If pt requires  treatment for chornic fatigue, modafinil could be a better option    Patient's care was discussed during the interdisciplinary team meeting every day during the hospitalization.   The patient denied having side effects to prescribed psychiatric medication.   Gradually, patient started adjusting to milieu. The patient was evaluated each day by a clinical provider to ascertain response to treatment. Improvement was noted by the patient's report of decreasing symptoms, improved sleep and appetite, affect, medication tolerance, behavior, and participation in unit programming.  Patient was asked each day to complete a self inventory noting mood, mental status, pain, new symptoms, anxiety and concerns.     Symptoms were reported as significantly decreased or resolved completely by discharge.    On day of discharge, the patient reports that their mood is stable. The patient denied having suicidal thoughts for more than 48 hours prior to discharge.  Patient denies having homicidal thoughts.  Patient denies having auditory hallucinations.  Patient denies any visual hallucinations or other symptoms of psychosis. The patient was motivated to continue taking medication with a goal of continued improvement in mental health.    The patient reports their target psychiatric symptoms of depression and suicidal thoughts, all responded well to the psychiatric medications, and the patient reports overall benefit other psychiatric hospitalization. Supportive psychotherapy was provided to the patient. The patient also participated in regular group therapy while hospitalized. Coping skills, problem solving as well as relaxation therapies were also part of the unit programming.   Labs were reviewed with the patient, and abnormal results were discussed with the patient.   The patient  is able to verbalize their individual safety plan to this provider.   # It is recommended to the patient to continue psychiatric medications  as prescribed, after discharge from the hospital.     # It is recommended to the patient to follow up with your outpatient psychiatric provider and PCP.   # It was discussed with the patient, the impact of alcohol, drugs, tobacco have been there overall psychiatric and medical wellbeing, and total abstinence from substance use was recommended the patient.ed.   # Prescriptions provided or sent directly to preferred pharmacy at discharge. Patient agreeable to plan. Given opportunity to ask questions. Appears to feel comfortable with discharge.    # In the event of worsening symptoms, the patient is instructed to call the crisis hotline, 911 and or go to the nearest ED for appropriate evaluation and treatment of symptoms. To follow-up with primary care provider for other medical issues, concerns and or health care needs   # Patient was discharged home with husband, with a plan to follow up as noted below.   Psychiatric Specialty Exam  Presentation  General Appearance:  Appropriate for Environment; Casual; Fairly Groomed  Eye Contact: Fair  Speech: Normal Rate  Speech Volume: Normal  Handedness: Right   Mood and Affect  Mood: Euthymic; Anxious  Duration of Depression Symptoms: Greater than two weeks  Affect: Appropriate; Congruent; Full Range   Thought Process  Thought Processes: Linear  Descriptions of Associations:Intact  Orientation:Full (Time, Place and Person)  Thought Content:Logical  History of Schizophrenia/Schizoaffective disorder:No  Duration of Psychotic Symptoms:No data recorded Hallucinations:Hallucinations: None  Ideas of Reference:None  Suicidal Thoughts:Suicidal Thoughts: No  Homicidal Thoughts:Homicidal Thoughts: No   Sensorium  Memory: Immediate Good; Recent Good; Remote Good  Judgment: Good  Insight: Good   Executive Functions  Concentration: Good  Attention Span: Good  Recall: Good  Fund of  Knowledge: Good  Language: Good   Psychomotor Activity  Psychomotor Activity: Psychomotor Activity: Normal   Assets  Assets: Social Support   Sleep  Sleep: Sleep: Fair   Physical Exam: Physical Exam See discharge summary  ROS See discharge summary   Blood pressure 96/67, pulse 85, temperature (!) 97.5 F (36.4 C), temperature source Oral, resp. rate 18, height 5\' 2"  (1.575 m), weight 96.4 kg, SpO2 98%. Body mass index is 38.89 kg/m.  Mental Status Per Nursing Assessment::   On Admission:  NA  Demographic factors:  Caucasian Loss Factors:  Decline in physical health Historical Factors:  Prior suicide attempts Risk Reduction Factors:  Living with another person, especially a relative    Continued Clinical Symptoms:  Mood is stable. Anxiety at a manageable level. Denying any SI including passive SI.    Cognitive Features That Contribute To Risk:  None    Suicide Risk:  Mild:  There are no identifiable suicide plans, no associated intent, mild dysphoria and related symptoms, good self-control (both objective and subjective assessment), few other risk factors, and identifiable protective factors, including available and accessible social support.    Follow-up Information     [facility]. Schedule an appointment as soon as possible for a visit on 11/29/2023.   Specialty: Behavioral Health Why: You have a medication management services appointment on 11/29/23 at 12:10 am, in person. Contact information: [address] [place] [address] [phone]         [facility]. Go on 10/28/2023.   Why: You have an appointment for therapy  services on 10/28/23 at 10:00 am, in person. Contact information: [address] [place] [address] [phone]         [facility] Go on 11/07/2023.   Why: You have an appointment for medication management services on 11/07/23 at  10:00 am.   The appointment will be held in person. Contact information: [address] [place] [address] [phone]                 Plan Of Care/Follow-up recommendations:   -Follow-up with your outpatient psychiatric provider -instructions on appointment date, time, and address (location) are provided to you in discharge paperwork.   -Take your psychiatric medications as prescribed at discharge - instructions are provided to you in the discharge paperwork   -Follow-up with outpatient primary care doctor and other specialists -for management of preventative medicine and chronic medical disease   -If you are prescribed an atypical antipsychotic medication, we recommend that your outpatient psychiatrist follow routine screening for side effects within 3 months of discharge, including monitoring: AIMS scale, height, weight, blood pressure, fasting lipid panel, HbA1c, and fasting blood sugar.    -Recommend total abstinence from alcohol, tobacco, and other illicit drug use at discharge.    -If your psychiatric symptoms recur, worsen, or if you have side effects to your psychiatric medications, call your outpatient psychiatric provider, 911, 988 or go to the nearest emergency department.   -If suicidal thoughts occur, immediately call your outpatient psychiatric provider, 911, 988 or go to the nearest emergency department.    Phineas Inches, MD 10/19/2023, 7:00 AM

## 2023-12-21 ENCOUNTER — Ambulatory Visit (INDEPENDENT_AMBULATORY_CARE_PROVIDER_SITE_OTHER): Admitting: Podiatry

## 2023-12-21 ENCOUNTER — Encounter: Payer: Self-pay | Admitting: Podiatry

## 2023-12-21 ENCOUNTER — Ambulatory Visit (INDEPENDENT_AMBULATORY_CARE_PROVIDER_SITE_OTHER)

## 2023-12-21 DIAGNOSIS — M21619 Bunion of unspecified foot: Secondary | ICD-10-CM | POA: Diagnosis not present

## 2023-12-21 DIAGNOSIS — B351 Tinea unguium: Secondary | ICD-10-CM | POA: Diagnosis not present

## 2023-12-21 DIAGNOSIS — M79671 Pain in right foot: Secondary | ICD-10-CM

## 2023-12-21 DIAGNOSIS — L97522 Non-pressure chronic ulcer of other part of left foot with fat layer exposed: Secondary | ICD-10-CM

## 2023-12-21 DIAGNOSIS — M79672 Pain in left foot: Secondary | ICD-10-CM | POA: Diagnosis not present

## 2023-12-21 DIAGNOSIS — M79674 Pain in right toe(s): Secondary | ICD-10-CM

## 2023-12-21 DIAGNOSIS — T148XXA Other injury of unspecified body region, initial encounter: Secondary | ICD-10-CM | POA: Diagnosis not present

## 2023-12-21 DIAGNOSIS — L97512 Non-pressure chronic ulcer of other part of right foot with fat layer exposed: Secondary | ICD-10-CM

## 2023-12-21 DIAGNOSIS — M79675 Pain in left toe(s): Secondary | ICD-10-CM

## 2023-12-21 MED ORDER — DOXYCYCLINE HYCLATE 100 MG PO TABS: 100.0000 mg | ORAL_TABLET | Freq: Two times a day (BID) | ORAL | 0 refills | Status: AC

## 2023-12-21 MED ORDER — CICLOPIROX 8 % EX SOLN: Freq: Every day | CUTANEOUS | 2 refills | Status: AC

## 2023-12-21 NOTE — Patient Instructions (Signed)
 Wash the feet with soap and water, dry well. You can use a small amount of antibiotic ointment and bandage on the toes with the blisters. Monitor for any signs/symptoms of infection. Call the office immediately if any occur or go directly to the emergency room. Call with any questions/concerns.  --  Diabetes Mellitus and Foot Care Diabetes, also called diabetes mellitus, may cause problems with your feet and legs because of poor blood flow (circulation). Poor circulation may make your skin: Become thinner and drier. Break more easily. Heal more slowly. Peel and crack. You may also have nerve damage (neuropathy). This can cause decreased feeling in your legs and feet. This means that you may not notice minor injuries to your feet that could lead to more serious problems. Finding and treating problems early is the best way to prevent future foot problems. How to care for your feet Foot hygiene  Wash your feet daily with warm water and mild soap. Do not use hot water. Then, pat your feet and the areas between your toes until they are fully dry. Do not soak your feet. This can dry your skin. Trim your toenails straight across. Do not dig under them or around the cuticle. File the edges of your nails with an emery board or nail file. Apply a moisturizing lotion or petroleum jelly to the skin on your feet and to dry, brittle toenails. Use lotion that does not contain alcohol and is unscented. Do not apply lotion between your toes. Shoes and socks Wear clean socks or stockings every day. Make sure they are not too tight. Do not wear knee-high stockings. These may decrease blood flow to your legs. Wear shoes that fit well and have enough cushioning. Always look in your shoes before you put them on to be sure there are no objects inside. To break in new shoes, wear them for just a few hours a day. This prevents injuries on your feet. Wounds, scrapes, corns, and calluses  Check your feet daily for  blisters, cuts, bruises, sores, and redness. If you cannot see the bottom of your feet, use a mirror or ask someone for help. Do not cut off corns or calluses or try to remove them with medicine. If you find a minor scrape, cut, or break in the skin on your feet, keep it and the skin around it clean and dry. You may clean these areas with mild soap and water. Do not clean the area with peroxide, alcohol, or iodine. If you have a wound, scrape, corn, or callus on your foot, look at it several times a day to make sure it is healing and not infected. Check for: Redness, swelling, or pain. Fluid or blood. Warmth. Pus or a bad smell. General tips Do not cross your legs. This may decrease blood flow to your feet. Do not use heating pads or hot water bottles on your feet. They may burn your skin. If you have lost feeling in your feet or legs, you may not know this is happening until it is too late. Protect your feet from hot and cold by wearing shoes, such as at the beach or on hot pavement. Schedule a complete foot exam at least once a year or more often if you have foot problems. Report any cuts, sores, or bruises to your health care provider right away. Where to find more information American Diabetes Association: diabetes.org Association of Diabetes Care & Education Specialists: diabeteseducator.org Contact a health care provider if: You have  a condition that increases your risk of infection, and you have any cuts, sores, or bruises on your feet. You have an injury that is not healing. You have redness on your legs or feet. You feel burning or tingling in your legs or feet. You have pain or cramps in your legs and feet. Your legs or feet are numb. Your feet always feel cold. You have pain around any toenails. Get help right away if: You have a wound, scrape, corn, or callus on your foot and: You have signs of infection. You have a fever. You have a red line going up your leg. This  information is not intended to replace advice given to you by your health care provider. Make sure you discuss any questions you have with your health care provider. Document Revised: 03/07/2022 Document Reviewed: 03/07/2022 Elsevier Patient Education  2024 [employer].

## 2023-12-22 NOTE — Progress Notes (Signed)
 Subjective:  Patient ID: [name], female    DOB: 1970/12/26,  MRN: [id number]  Chief Complaint  Patient presents with   Blister    RM#13 Patient states has blisters on toes has been present for 3 days causing pain.    Discussed the use of AI scribe software for clinical note transcription with the patient, who gave verbal consent to proceed.  History of Present Illness The patient, with a history of diabetes and MRSA, presents with blisters on both feet that have been present since Thursday. The blisters are located on the pinky toes and  the big toes. The blisters on the pinky toes have turned into calluses. The patient reports that the blisters are painful, particularly on the big toes. The patient denies any changes in shoes, socks, or activities that could have caused the blisters. The patient has a history of similar blisters, which were attributed to wearing insoles that only covered part of the foot. The patient is planning to get new shoes due to the current ones wearing out. The patient denies excessive sweating of the feet. The patient has not received any treatment for the blisters yet.  In addition to the blisters, the patient also complains of pain from the ball of the foot upwards on both feet. The patient reports that the pain is present on both feet. The patient also mentions that her toenails have become hard and curved, which she attributes to her diabetes.      Objective:    Physical Exam General: AAO x3, NAD  Dermatological: Ulcers present lateral aspect of bilateral hallux.  Upon draining there clear fluid but no purulence.  Local edema but there is no cellulitis.  Blister with callus noted on the plantar aspect of the foot on the first interspace on the right foot.  Previous pulses on the fifth toe which are also dried.  No lesions identified otherwise.  Nails are hypertrophic and dystrophic with yellow, brown discoloration.  Vascular: Dorsalis Pedis artery and  Posterior Tibial artery pedal pulses are 2/4 bilateral with immedate capillary fill time.  There is no pain with calf compression, swelling, warmth, erythema.   Neruologic: Grossly intact via light touch bilateral.  Musculoskeletal: Bunion is present with hallux abductus noted.  Tenderness palpation of the proximal hallux.  She gets diffuse discomfort submetatarsal bilaterally is a more chronic issue.  There is no area pinpoint tenderness identified this time.  No edema to this area.  Gait: Unassisted, Nonantalgic.     No images are attached to the encounter.    Results  RADIOLOGY Foot X-ray: No bone spurs, no acute changes to suggest osteomyelitis.  Presence of bunions (12/21/2023)   Assessment:   1. Blister   2. Bunion   3. Pain in toes of both feet   4. Onychomycosis      Plan:  Patient was evaluated and treated and all questions answered.  Assessment and Plan Assessment & Plan Blisters on feet Blisters on pinky toes and under calluses due to friction, worn-out shoes, and anatomical structure.  - Drain blisters to relieve pain and this was difficult for. - Prescribe antibiotics due to MRSA and diabetes.  Prescribe doxycycline - Advise Betadine or triple antibiotic ointment to keep area clean and dry. - Recommend Band-Aid for protection. - Suggest shoes with wider toe box and breathable material to reduce friction. - Provide pads to cushion ball of foot.  Bunions Bunions contribute to toe misalignment and friction. Chronic condition worsened by improper  footwear. - Recommend shoes with wide toe box and good support to accommodate bunions. - Advise against tight or stiff shoe materials.  Metatarsalgia Pain in ball of foot due to inflammation, worsened by improper footwear and blisters. Expected to improve with better footwear and cushioning. - Provide metatarsal pads to cushion ball of foot. - Recommend supportive shoes with wide toe box.  Onychomycosis Thickened,  yellow toenails suggest fungal infection, likely chronic and related to diabetes. - Prescribe Penlac (ciclopirox) for topical treatment of fungal infection.  Diabetes Mellitus Diabetes well-managed but increases risk of foot complications. - Advise wearing shoes indoors to prevent foot injuries.  Follow-up Follow-up necessary to monitor healing of blisters and treatment efficacy. - Schedule follow-up appointment in a couple of weeks to assess healing and treatment efficacy. - Instruct to report increased swelling, redness, drainage, fevers, or chills.  Return in about 2 weeks (around 01/04/2024) for blisters.    Vivi Barrack DPM

## 2023-12-26 LAB — WOUND CULTURE
MICRO NUMBER:: 16294990
RESULT:: NO GROWTH
SPECIMEN QUALITY:: ADEQUATE

## 2023-12-27 ENCOUNTER — Encounter: Payer: Self-pay | Admitting: Podiatry

## 2024-01-06 ENCOUNTER — Ambulatory Visit (INDEPENDENT_AMBULATORY_CARE_PROVIDER_SITE_OTHER): Admitting: Podiatry

## 2024-01-06 DIAGNOSIS — M7742 Metatarsalgia, left foot: Secondary | ICD-10-CM

## 2024-01-06 DIAGNOSIS — T148XXA Other injury of unspecified body region, initial encounter: Secondary | ICD-10-CM | POA: Diagnosis not present

## 2024-01-06 DIAGNOSIS — M7741 Metatarsalgia, right foot: Secondary | ICD-10-CM

## 2024-01-06 DIAGNOSIS — M21619 Bunion of unspecified foot: Secondary | ICD-10-CM

## 2024-01-06 NOTE — Progress Notes (Signed)
  Subjective:  Patient ID: [name], female    DOB: 1970-12-31,  MRN: [id number]  Chief Complaint  Patient presents with   BLISTERS    RM#12 Follow up on blisters patient states still having redness.    History of Present Illness 53 year old female presents office today for follow-up evaluation of blisters, redness.  She is overall she is doing much better and she is feeling great.  She has some redness with the blister described but no drainage or pus or swelling.  Does not report fevers or chills.  She did get the top oral medication for nail fungus but she has not yet started this.  She states that metatarsal pads have been very helpful any helped resolve her pain.      Objective:    Physical Exam General: AAO x3, NAD  Dermatological: Along with to the blisters to the hallux these are callused over.  There is no surrounding erythema, ascending cellulitis there is no drainage approximately signs of infection.  There are no open lesion identified today.  Nails are unchanged.  No open lesions.  Vascular: Dorsalis Pedis artery and Posterior Tibial artery pedal pulses are 2/4 bilateral with immedate capillary fill time.  There is no pain with calf compression, swelling, warmth, erythema.   Neruologic: Grossly intact via light touch bilateral.  Musculoskeletal: Bunion is present with hallux abductus noted.  There is no significant tenderness to palpation of the hallux.  There is currently no discomfort submetatarsal bilaterally.  There is no area pinpoint tenderness identified this time.  No edema to this area.  Gait: Unassisted, Nonantalgic.     No images are attached to the encounter.    Results  RADIOLOGY Foot X-ray: No bone spurs, no acute changes to suggest osteomyelitis.  Presence of bunions (12/21/2023)   Assessment:   Resolved blister, metatarsalgia    Plan:  Patient was evaluated and treated and all questions answered.  Assessment and Plan Assessment &  Plan Blisters on feet - No reoccurrence.  Continue offloading.  Monitor for any reoccurrence or signs of infection.  Bunions Bunions contribute to toe misalignment and friction. Chronic condition worsened by improper footwear. - Recommend shoes with wide toe box and good support to accommodate bunions. - Advise against tight or stiff shoe materials.  Metatarsalgia Pain in ball of foot due to inflammation, worsened by improper footwear and blisters. Expected to improve with better footwear and cushioning. - Provide metatarsal pads to cushion ball of foot. She likes the grey metatarsal pads.  - Recommend supportive shoes with wide toe box.  Onychomycosis Thickened, yellow toenails suggest fungal infection, likely chronic and related to diabetes. - Prescribe Penlac  (ciclopirox ) for topical treatment of fungal infection.  She is going to start using this as she has not yet started.   No follow-ups on file.  Charity Conch DPM

## 2024-05-05 NOTE — Progress Notes (Signed)
 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  She noticed some drainage and reports pain 9 out of 10 from her left hip down her leg.  She has been changing the dressing daily and apply mupirocin.  She has taken Tylenol  and Advil and has felt that that combination still has not diminish the associated pain.  She presents today for review of the wound.  There is noted to have dehiscence centrally with scant exudate and erythema but only only surrounding the wound edge.   Prior bacterial culture demonstrated MSSA and group B strep growth.  She had had some improvement with Bactrim, though not doxycycline .   Given pain and report of chills only at night I recommend continuance of mupirocin application with foam dressing bandage and to start the following  Requested Prescriptions   Signed Prescriptions Disp Refills  . sulfamethoxazole-trimethoprim (BACTRIM DS) 800-160 mg tablet 6 tablet 0    Sig: Take 1 tablet (160 mg of trimethoprim total) by mouth 2 (two) times daily for 7 days  . traMADoL  (ULTRAM ) 50 mg tablet 5 tablet 0    Sig: Take 1 tablet (50 mg total) by mouth every 8 (eight) hours as needed for Pain Watch closely for oversedation    She was prescribed tramadol  few weeks ago and tolerated well with her existing medications without oversedation or respiratory issues.  She is aware to monitor for the symptoms with the tramadol . Bacterial swab also taken as a backup plan.  Will update as results become available.

## 2024-05-07 ENCOUNTER — Emergency Department (HOSPITAL_BASED_OUTPATIENT_CLINIC_OR_DEPARTMENT_OTHER)

## 2024-05-07 ENCOUNTER — Other Ambulatory Visit: Payer: Self-pay

## 2024-05-07 ENCOUNTER — Emergency Department (HOSPITAL_BASED_OUTPATIENT_CLINIC_OR_DEPARTMENT_OTHER)
Admission: EM | Admit: 2024-05-07 | Discharge: 2024-05-07 | Disposition: A | Discharge status: Home / Self Care | Attending: Emergency Medicine | Admitting: Emergency Medicine

## 2024-05-07 DIAGNOSIS — M79605 Pain in left leg: Secondary | ICD-10-CM | POA: Insufficient documentation

## 2024-05-07 LAB — CBC WITH DIFFERENTIAL/PLATELET
Abs Immature Granulocytes: 0.02 K/uL (ref 0.00–0.07)
Basophils Absolute: 0.1 K/uL (ref 0.0–0.1)
Basophils Relative: 1 %
Eosinophils Absolute: 0.3 K/uL (ref 0.0–0.5)
Eosinophils Relative: 5 %
HCT: 38.2 % (ref 36.0–46.0)
Hemoglobin: 12.9 g/dL (ref 12.0–15.0)
Immature Granulocytes: 0 %
Lymphocytes Relative: 31 %
Lymphs Abs: 1.7 K/uL (ref 0.7–4.0)
MCH: 26.8 pg (ref 26.0–34.0)
MCHC: 33.8 g/dL (ref 30.0–36.0)
MCV: 79.3 fL — ABNORMAL LOW (ref 80.0–100.0)
Monocytes Absolute: 0.5 K/uL (ref 0.1–1.0)
Monocytes Relative: 9 %
Neutro Abs: 3 K/uL (ref 1.7–7.7)
Neutrophils Relative %: 54 %
Platelets: 258 K/uL (ref 150–400)
RBC: 4.82 MIL/uL (ref 3.87–5.11)
RDW: 14.2 % (ref 11.5–15.5)
WBC: 5.4 K/uL (ref 4.0–10.5)
nRBC: 0 % (ref 0.0–0.2)

## 2024-05-07 LAB — COMPREHENSIVE METABOLIC PANEL WITH GFR
ALT: 17 U/L (ref 0–44)
AST: 23 U/L (ref 15–41)
Albumin: 4.6 g/dL (ref 3.5–5.0)
Alkaline Phosphatase: 95 U/L (ref 38–126)
Anion gap: 14 (ref 5–15)
BUN: 14 mg/dL (ref 6–20)
CO2: 23 mmol/L (ref 22–32)
Calcium: 10.1 mg/dL (ref 8.9–10.3)
Chloride: 102 mmol/L (ref 98–111)
Creatinine, Ser: 0.94 mg/dL (ref 0.44–1.00)
GFR, Estimated: >60 mL/min (ref >60)
Glucose, Bld: 107 mg/dL — ABNORMAL HIGH (ref 70–99)
Potassium: 4.1 mmol/L (ref 3.5–5.1)
Sodium: 139 mmol/L (ref 135–145)
Total Bilirubin: 0.3 mg/dL (ref 0.0–1.2)
Total Protein: 7.4 g/dL (ref 6.5–8.1)

## 2024-05-07 LAB — LACTIC ACID, PLASMA
Lactic Acid, Venous: 0.9 mmol/L (ref 0.5–1.9)
Lactic Acid, Venous: 0.6 mmol/L (ref 0.5–1.9)

## 2024-05-07 MED ORDER — OXYCODONE HCL 5 MG PO TABS
5.0000 mg | ORAL_TABLET | Freq: Once | ORAL | Status: AC
  Administered 2024-05-07: 5 mg via ORAL
  Filled 2024-05-07: qty 1

## 2024-05-07 MED ORDER — TRAMADOL HCL 50 MG PO TABS: 50.0000 mg | ORAL_TABLET | Freq: Two times a day (BID) | ORAL | 0 refills | Status: DC | PRN

## 2024-05-07 MED ORDER — TRAMADOL HCL 50 MG PO TABS: 50.0000 mg | ORAL_TABLET | Freq: Two times a day (BID) | ORAL | 0 refills | Status: AC | PRN

## 2024-05-07 NOTE — ED Provider Notes (Signed)
 [place] EMERGENCY DEPARTMENT AT [facility] Provider Note   CSN: [id number] Arrival date & time: 05/07/24  8077     Patient presents with: Leg Pain  HPI [name] is a 53 y.o. female presenting for left leg pain.  Pain is located behind the left knee where she had excision of basal cell and atypical nevus on the 23rd of last month.  This procedure was done at [facility] and she was seen by them 2 days ago for reevaluation.  They started her on Bactrim and sent her home with tramadol  she was endorsing leg pain at that time as well.  She is presenting today for pain in the area that seems to be radiating up and down the leg.  Denies fever.  Denies pustular oozing or bleeding.     Leg Pain      Prior to Admission medications   Medication Sig Start Date End Date Taking? Authorizing Provider  traMADol  (ULTRAM ) 50 MG tablet Take 1 tablet (50 mg total) by mouth every 12 (twelve) hours as needed for up to 5 doses. 05/07/24  Yes Jarvis Sawa K, PA-C  albuterol  (PROVENTIL  HFA;VENTOLIN  HFA) 108 (90 BASE) MCG/ACT inhaler Inhale 1 puff into the lungs every 6 (six) hours as needed for wheezing or shortness of breath.    [provider]  cetirizine (ZYRTEC) 10 MG tablet Take 10 mg by mouth at bedtime. 12/05/21   [provider]  ciclopirox  (PENLAC ) 8 % solution Apply topically at bedtime. Apply over nail and surrounding skin. Apply daily over previous coat. After seven (7) days, may remove with alcohol and continue cycle. 12/21/23   Gershon Donnice SAUNDERS, DPM  cloNIDine  (CATAPRES ) 0.1 MG tablet Take 1 tablet (0.1 mg total) by mouth at bedtime. 10/19/23 11/18/23  Massengill, Rankin, MD  doxycycline  (VIBRA -TABS) 100 MG tablet Take 1 tablet (100 mg total) by mouth 2 (two) times daily. Patient not taking: Reported on 01/06/2024 12/21/23   Gershon Donnice SAUNDERS, DPM  hydrOXYzine  (ATARAX ) 25 MG tablet Take 1 tablet (25 mg total) by mouth 3 (three) times daily as needed for anxiety. 10/19/23    Massengill, Rankin, MD  isosorbide  mononitrate (IMDUR ) 30 MG 24 hr tablet Take 1 tablet (30 mg total) by mouth daily. 10/19/23 11/18/23  Massengill, Rankin, MD  lamoTRIgine  (LAMICTAL ) 25 MG tablet Take 1 tablet (25 mg total) by mouth daily. 10/19/23 11/18/23  Massengill, Rankin, MD  minoxidil  (LONITEN ) 2.5 MG tablet Take 1 tablet (2.5 mg total) by mouth 2 (two) times daily. 10/19/23 11/18/23  Massengill, Rankin, MD  pravastatin  (PRAVACHOL ) 10 MG tablet Take 1 tablet (10 mg total) by mouth daily at 6 PM. 10/19/23 11/18/23  Massengill, Rankin, MD  pregabalin  (LYRICA ) 150 MG capsule Take 1 capsule (150 mg total) by mouth every 8 (eight) hours. 10/19/23 11/18/23  Massengill, Rankin, MD  traZODone  (DESYREL ) 100 MG tablet Take 1 tablet (100 mg total) by mouth at bedtime. 10/19/23 11/18/23  Massengill, Rankin, MD  venlafaxine  XR (EFFEXOR -XR) 75 MG 24 hr capsule Take 1 capsule (75 mg total) by mouth daily with breakfast. 10/19/23 11/18/23  Massengill, Rankin, MD    Allergies: Famotidine , Flagyl [metronidazole], Lactose, Dapagliflozin, Gabapentin , Ketorolac tromethamine, Nsaids, Salicylates, Valproic  acid, Hydromorphone  hcl, Iodinated contrast media, Prednisone , and Tape    Review of Systems See HPI  Updated Vital Signs BP 116/74   Pulse 83   Temp 97.8 F (36.6 C) (Oral)   Resp (!) 24   Wt 97.5 kg   SpO2 95%  BMI 39.32 kg/m   Physical Exam Vitals and nursing note reviewed.  HENT:     Head: Normocephalic and atraumatic.     Mouth/Throat:     Mouth: Mucous membranes are moist.  Eyes:     General:        Right eye: No discharge.        Left eye: No discharge.     Conjunctiva/sclera: Conjunctivae normal.  Cardiovascular:     Rate and Rhythm: Normal rate and regular rhythm.     Pulses: Normal pulses.     Heart sounds: Normal heart sounds.  Pulmonary:     Effort: Pulmonary effort is normal.     Breath sounds: Normal breath sounds.  Abdominal:     General: Abdomen is flat.     Palpations: Abdomen is soft.   Musculoskeletal:       Legs:  Skin:    General: Skin is warm and dry.  Neurological:     General: No focal deficit present.  Psychiatric:        Mood and Affect: Mood normal.     (all labs ordered are listed, but only abnormal results are displayed) Labs Reviewed  COMPREHENSIVE METABOLIC PANEL WITH GFR - Abnormal; Notable for the following components:      Result Value   Glucose, Bld 107 (*)    All other components within normal limits  CBC WITH DIFFERENTIAL/PLATELET - Abnormal; Notable for the following components:   MCV 79.3 (*)    All other components within normal limits  LACTIC ACID, PLASMA  LACTIC ACID, PLASMA    EKG: None  Radiology: CT EXTREMITY LOWER LEFT WO CONTRAST Result Date: 05/07/2024 CLINICAL DATA:  Recent skin cancer removal. Poor wound healing, radiating leg pain. EXAM: CT OF THE LOWER LEFT EXTREMITY WITHOUT CONTRAST TECHNIQUE: Multidetector CT imaging of the lower left extremity was performed according to the standard protocol. RADIATION DOSE REDUCTION: This exam was performed according to the departmental dose-optimization program which includes automated exposure control, adjustment of the mA and/or kV according to patient size and/or use of iterative reconstruction technique. COMPARISON:  Radiograph 05/15/2018 FINDINGS: Bones/Joint/Cartilage No acute fracture evidence of osteomyelitis. No knee joint effusion. Ligaments Suboptimally assessed by CT. Muscles and Tendons Unremarkable. Soft tissues Mild scarring and trace edema within the subcutaneous fat of the distal posteromedial thigh presumably related to recent procedure. No soft tissue gas or abscess. IMPRESSION: Mild scarring and trace edema within the subcutaneous fat of the distal posteromedial thigh presumably related to recent procedure. No soft tissue gas or abscess. Electronically Signed   By: Norman Gatlin M.D.   On: 05/07/2024 22:39     Procedures   Medications Ordered in the ED  oxyCODONE   (Oxy IR/ROXICODONE ) immediate release tablet 5 mg (5 mg Oral Given 05/07/24 2149)                                    Medical Decision Making Amount and/or Complexity of Data Reviewed Labs: ordered. Radiology: ordered.  Risk Prescription drug management.   53 year old well-appearing female presenting for right leg pain associated with excisional procedure last month.  Exam was unremarkable there was no obvious evidence of infection.  CT also reassuring without evidence of soft tissue gas or abscess.  Reviewed her recent dermatology evaluation at [facility].  Saw that she was sent home with Bactrim and tramadol .  Feel this is reasonable treatment at this time.  Advised to continue as prescribed and to follow-up with her dermatologist.  Sent 5 more tablets of tramadol  to her pharmacy.  Patient agreeable to this plan.  Discussed return precautions.  Discharged good condition.     Final diagnoses:  Left leg pain    ED Discharge Orders          Ordered    traMADol  (ULTRAM ) 50 MG tablet  Every 12 hours PRN,   Status:  Discontinued        05/07/24 2337    traMADol  (ULTRAM ) 50 MG tablet  Every 12 hours PRN,   Status:  Discontinued        05/07/24 2337    traMADol  (ULTRAM ) 50 MG tablet  Every 12 hours PRN        05/07/24 2338               Mayes Sangiovanni K, PA-C 05/07/24 2340    Elnor Bernarda SQUIBB, DO 05/20/24 2329

## 2024-05-07 NOTE — Discharge Instructions (Signed)
 Evaluation for your left leg pain is overall reassuring.  As we discussed please follow-up with your dermatologist and continue taking your Bactrim.  I sent 5 more tablets of your tramadol  to your pharmacy for pain.  If symptoms worsen, you develop redness around the site, pustular oozing, develop a fever or any other concerning symptom please return to the ED for further evaluation.

## 2024-05-07 NOTE — ED Triage Notes (Addendum)
 Skin cancer removed from leg 23rd. Started on antibiotic- trimethiprim 2 weeks. Left leg. Very painful entire leg now.
